# Patient Record
Sex: Female | Born: 1970
Health system: Southern US, Community
[De-identification: ages and names within clinical notes are randomized; demographics above are authoritative.]

## PROBLEM LIST (undated history)

## (undated) DIAGNOSIS — E236 Other disorders of pituitary gland: Secondary | ICD-10-CM

## (undated) DIAGNOSIS — I499 Cardiac arrhythmia, unspecified: Secondary | ICD-10-CM

## (undated) DIAGNOSIS — Z8614 Personal history of Methicillin resistant Staphylococcus aureus infection: Secondary | ICD-10-CM

## (undated) DIAGNOSIS — E785 Hyperlipidemia, unspecified: Secondary | ICD-10-CM

## (undated) DIAGNOSIS — E119 Type 2 diabetes mellitus without complications: Secondary | ICD-10-CM

## (undated) DIAGNOSIS — K5792 Diverticulitis of intestine, part unspecified, without perforation or abscess without bleeding: Secondary | ICD-10-CM

## (undated) DIAGNOSIS — K219 Gastro-esophageal reflux disease without esophagitis: Secondary | ICD-10-CM

## (undated) DIAGNOSIS — E559 Vitamin D deficiency, unspecified: Secondary | ICD-10-CM

## (undated) DIAGNOSIS — G629 Polyneuropathy, unspecified: Secondary | ICD-10-CM

## (undated) DIAGNOSIS — J189 Pneumonia, unspecified organism: Secondary | ICD-10-CM

## (undated) DIAGNOSIS — R011 Cardiac murmur, unspecified: Secondary | ICD-10-CM

## (undated) DIAGNOSIS — F32A Depression, unspecified: Secondary | ICD-10-CM

## (undated) DIAGNOSIS — M7662 Achilles tendinitis, left leg: Secondary | ICD-10-CM

## (undated) DIAGNOSIS — Z87898 Personal history of other specified conditions: Secondary | ICD-10-CM

## (undated) DIAGNOSIS — K5900 Constipation, unspecified: Secondary | ICD-10-CM

## (undated) DIAGNOSIS — F419 Anxiety disorder, unspecified: Secondary | ICD-10-CM

## (undated) DIAGNOSIS — M1990 Unspecified osteoarthritis, unspecified site: Secondary | ICD-10-CM

## (undated) DIAGNOSIS — E039 Hypothyroidism, unspecified: Secondary | ICD-10-CM

## (undated) DIAGNOSIS — F329 Major depressive disorder, single episode, unspecified: Secondary | ICD-10-CM

## (undated) DIAGNOSIS — D72820 Lymphocytosis (symptomatic): Secondary | ICD-10-CM

## (undated) DIAGNOSIS — G609 Hereditary and idiopathic neuropathy, unspecified: Secondary | ICD-10-CM

## (undated) DIAGNOSIS — R079 Chest pain, unspecified: Secondary | ICD-10-CM

## (undated) DIAGNOSIS — J45909 Unspecified asthma, uncomplicated: Secondary | ICD-10-CM

## (undated) DIAGNOSIS — I10 Essential (primary) hypertension: Secondary | ICD-10-CM

## (undated) DIAGNOSIS — D680 Von Willebrand's disease: Secondary | ICD-10-CM

## (undated) DIAGNOSIS — C801 Malignant (primary) neoplasm, unspecified: Secondary | ICD-10-CM

## (undated) DIAGNOSIS — E739 Lactose intolerance, unspecified: Secondary | ICD-10-CM

## (undated) DIAGNOSIS — D352 Benign neoplasm of pituitary gland: Secondary | ICD-10-CM

## (undated) DIAGNOSIS — M205X2 Other deformities of toe(s) (acquired), left foot: Secondary | ICD-10-CM

## (undated) DIAGNOSIS — M549 Dorsalgia, unspecified: Secondary | ICD-10-CM

## (undated) DIAGNOSIS — R7303 Prediabetes: Secondary | ICD-10-CM

## (undated) DIAGNOSIS — M5030 Other cervical disc degeneration, unspecified cervical region: Secondary | ICD-10-CM

## (undated) DIAGNOSIS — R002 Palpitations: Secondary | ICD-10-CM

## (undated) DIAGNOSIS — J449 Chronic obstructive pulmonary disease, unspecified: Secondary | ICD-10-CM

## (undated) DIAGNOSIS — Z8489 Family history of other specified conditions: Secondary | ICD-10-CM

## (undated) DIAGNOSIS — G4730 Sleep apnea, unspecified: Secondary | ICD-10-CM

## (undated) DIAGNOSIS — R51 Headache: Secondary | ICD-10-CM

## (undated) DIAGNOSIS — R519 Headache, unspecified: Secondary | ICD-10-CM

## (undated) DIAGNOSIS — R000 Tachycardia, unspecified: Secondary | ICD-10-CM

## (undated) DIAGNOSIS — R1310 Dysphagia, unspecified: Secondary | ICD-10-CM

## (undated) DIAGNOSIS — E079 Disorder of thyroid, unspecified: Secondary | ICD-10-CM

## (undated) DIAGNOSIS — E282 Polycystic ovarian syndrome: Secondary | ICD-10-CM

## (undated) DIAGNOSIS — M2550 Pain in unspecified joint: Secondary | ICD-10-CM

## (undated) DIAGNOSIS — T7840XA Allergy, unspecified, initial encounter: Secondary | ICD-10-CM

## (undated) DIAGNOSIS — K449 Diaphragmatic hernia without obstruction or gangrene: Secondary | ICD-10-CM

## (undated) DIAGNOSIS — M810 Age-related osteoporosis without current pathological fracture: Secondary | ICD-10-CM

## (undated) HISTORY — DX: Polycystic ovarian syndrome: E28.2

## (undated) HISTORY — DX: Pain in unspecified joint: M25.50

## (undated) HISTORY — DX: Hyperlipidemia, unspecified: E78.5

## (undated) HISTORY — DX: Essential (primary) hypertension: I10

## (undated) HISTORY — DX: Vitamin D deficiency, unspecified: E55.9

## (undated) HISTORY — DX: Cardiac murmur, unspecified: R01.1

## (undated) HISTORY — DX: Age-related osteoporosis without current pathological fracture: M81.0

## (undated) HISTORY — PX: COLONOSCOPY: SHX174

## (undated) HISTORY — DX: Dysphagia, unspecified: R13.10

## (undated) HISTORY — DX: Type 2 diabetes mellitus without complications: E11.9

## (undated) HISTORY — PX: BACK SURGERY: SHX140

## (undated) HISTORY — PX: TONSILLECTOMY AND ADENOIDECTOMY: SUR1326

## (undated) HISTORY — DX: Gastro-esophageal reflux disease without esophagitis: K21.9

## (undated) HISTORY — DX: Lactose intolerance, unspecified: E73.9

## (undated) HISTORY — PX: CHOLECYSTECTOMY: SHX55

## (undated) HISTORY — DX: Constipation, unspecified: K59.00

## (undated) HISTORY — DX: Diaphragmatic hernia without obstruction or gangrene: K44.9

## (undated) HISTORY — DX: Disorder of thyroid, unspecified: E07.9

## (undated) HISTORY — DX: Hereditary and idiopathic neuropathy, unspecified: G60.9

## (undated) HISTORY — DX: Chest pain, unspecified: R07.9

## (undated) HISTORY — PX: SPINE SURGERY: SHX786

## (undated) HISTORY — DX: Diverticulitis of intestine, part unspecified, without perforation or abscess without bleeding: K57.92

## (undated) HISTORY — DX: Tachycardia, unspecified: R00.0

## (undated) HISTORY — DX: Palpitations: R00.2

## (undated) HISTORY — DX: Allergy, unspecified, initial encounter: T78.40XA

## (undated) HISTORY — DX: Dorsalgia, unspecified: M54.9

---

## 1999-05-01 ENCOUNTER — Other Ambulatory Visit: Admission: RE | Admit: 1999-05-01 | Discharge: 1999-05-01 | Payer: Self-pay | Admitting: Gynecology

## 2001-05-17 ENCOUNTER — Other Ambulatory Visit: Admission: RE | Admit: 2001-05-17 | Discharge: 2001-05-17 | Payer: Self-pay | Admitting: Obstetrics and Gynecology

## 2002-06-15 ENCOUNTER — Ambulatory Visit (HOSPITAL_COMMUNITY): Admission: RE | Admit: 2002-06-15 | Discharge: 2002-06-15 | Payer: Self-pay | Admitting: Family Medicine

## 2002-06-15 ENCOUNTER — Encounter: Payer: Self-pay | Admitting: Family Medicine

## 2002-06-23 ENCOUNTER — Observation Stay (HOSPITAL_COMMUNITY): Admission: RE | Admit: 2002-06-23 | Discharge: 2002-06-24 | Payer: Self-pay | Admitting: Neurosurgery

## 2003-08-26 ENCOUNTER — Emergency Department (HOSPITAL_COMMUNITY): Admission: EM | Admit: 2003-08-26 | Discharge: 2003-08-26 | Payer: Self-pay | Admitting: Emergency Medicine

## 2004-07-19 DIAGNOSIS — Z87898 Personal history of other specified conditions: Secondary | ICD-10-CM

## 2004-07-19 HISTORY — DX: Personal history of other specified conditions: Z87.898

## 2004-12-04 ENCOUNTER — Ambulatory Visit: Payer: Self-pay | Admitting: Otolaryngology

## 2005-03-19 ENCOUNTER — Ambulatory Visit: Payer: Self-pay | Admitting: Otolaryngology

## 2005-06-05 ENCOUNTER — Ambulatory Visit: Payer: Self-pay | Admitting: Internal Medicine

## 2005-07-14 ENCOUNTER — Ambulatory Visit: Payer: Self-pay | Admitting: Otolaryngology

## 2005-07-29 ENCOUNTER — Ambulatory Visit: Payer: Self-pay | Admitting: Internal Medicine

## 2005-08-06 ENCOUNTER — Inpatient Hospital Stay: Payer: Self-pay | Admitting: Internal Medicine

## 2005-08-26 ENCOUNTER — Ambulatory Visit: Payer: Self-pay | Admitting: Pain Medicine

## 2005-09-02 ENCOUNTER — Encounter: Payer: Self-pay | Admitting: Internal Medicine

## 2005-09-04 ENCOUNTER — Ambulatory Visit: Payer: Self-pay | Admitting: Internal Medicine

## 2005-09-18 ENCOUNTER — Ambulatory Visit: Payer: Self-pay | Admitting: Internal Medicine

## 2005-09-18 ENCOUNTER — Encounter: Payer: Self-pay | Admitting: Internal Medicine

## 2005-09-21 ENCOUNTER — Ambulatory Visit: Payer: Self-pay | Admitting: Pain Medicine

## 2005-10-19 ENCOUNTER — Encounter: Payer: Self-pay | Admitting: Internal Medicine

## 2005-11-20 ENCOUNTER — Ambulatory Visit: Payer: Self-pay | Admitting: Internal Medicine

## 2006-02-18 ENCOUNTER — Ambulatory Visit: Payer: Self-pay | Admitting: Internal Medicine

## 2006-03-19 ENCOUNTER — Ambulatory Visit: Payer: Self-pay | Admitting: Internal Medicine

## 2006-03-29 ENCOUNTER — Ambulatory Visit: Payer: Self-pay | Admitting: Internal Medicine

## 2006-05-07 ENCOUNTER — Ambulatory Visit: Payer: Self-pay | Admitting: Internal Medicine

## 2006-05-14 ENCOUNTER — Ambulatory Visit: Payer: Self-pay | Admitting: Internal Medicine

## 2006-05-19 ENCOUNTER — Ambulatory Visit: Payer: Self-pay | Admitting: Internal Medicine

## 2006-06-17 ENCOUNTER — Ambulatory Visit: Payer: Self-pay | Admitting: Internal Medicine

## 2006-10-19 DIAGNOSIS — J189 Pneumonia, unspecified organism: Secondary | ICD-10-CM

## 2006-10-19 HISTORY — DX: Pneumonia, unspecified organism: J18.9

## 2006-10-29 ENCOUNTER — Ambulatory Visit: Payer: Self-pay | Admitting: Internal Medicine

## 2006-11-19 ENCOUNTER — Ambulatory Visit: Payer: Self-pay | Admitting: Internal Medicine

## 2007-02-03 ENCOUNTER — Ambulatory Visit: Payer: Self-pay

## 2007-02-15 ENCOUNTER — Ambulatory Visit: Payer: Self-pay

## 2007-08-17 ENCOUNTER — Ambulatory Visit: Payer: Self-pay

## 2007-09-14 ENCOUNTER — Ambulatory Visit: Payer: Self-pay | Admitting: Otolaryngology

## 2007-12-18 ENCOUNTER — Ambulatory Visit: Payer: Self-pay | Admitting: Internal Medicine

## 2007-12-26 ENCOUNTER — Ambulatory Visit: Payer: Self-pay | Admitting: Internal Medicine

## 2008-02-06 ENCOUNTER — Ambulatory Visit: Payer: Self-pay | Admitting: Unknown Physician Specialty

## 2008-02-06 HISTORY — PX: OTHER SURGICAL HISTORY: SHX169

## 2008-06-05 ENCOUNTER — Ambulatory Visit: Payer: Self-pay | Admitting: Internal Medicine

## 2008-06-27 ENCOUNTER — Ambulatory Visit: Payer: Self-pay | Admitting: Pain Medicine

## 2008-07-04 ENCOUNTER — Ambulatory Visit: Payer: Self-pay | Admitting: Pain Medicine

## 2008-07-05 ENCOUNTER — Ambulatory Visit: Payer: Self-pay | Admitting: Pain Medicine

## 2008-08-08 ENCOUNTER — Ambulatory Visit: Payer: Self-pay | Admitting: Physician Assistant

## 2008-10-19 HISTORY — PX: BREAST BIOPSY: SHX20

## 2009-08-01 ENCOUNTER — Ambulatory Visit: Payer: Self-pay | Admitting: Obstetrics and Gynecology

## 2009-08-13 ENCOUNTER — Ambulatory Visit: Payer: Self-pay | Admitting: Obstetrics and Gynecology

## 2009-08-26 ENCOUNTER — Ambulatory Visit: Payer: Self-pay | Admitting: Surgery

## 2009-11-12 ENCOUNTER — Ambulatory Visit: Payer: Self-pay | Admitting: Family Medicine

## 2010-03-25 ENCOUNTER — Ambulatory Visit: Payer: Self-pay | Admitting: General Surgery

## 2010-07-22 ENCOUNTER — Ambulatory Visit: Payer: Self-pay | Admitting: Family Medicine

## 2010-07-24 ENCOUNTER — Ambulatory Visit: Payer: Self-pay | Admitting: Family Medicine

## 2010-08-14 ENCOUNTER — Ambulatory Visit: Payer: Self-pay | Admitting: Obstetrics and Gynecology

## 2010-10-19 DIAGNOSIS — D6800 Von Willebrand disease, unspecified: Secondary | ICD-10-CM

## 2010-10-19 HISTORY — DX: Von Willebrand disease: D68.0

## 2010-10-19 HISTORY — DX: Von Willebrand disease, unspecified: D68.00

## 2012-06-10 ENCOUNTER — Ambulatory Visit (HOSPITAL_COMMUNITY)
Admission: RE | Admit: 2012-06-10 | Discharge: 2012-06-10 | Disposition: A | Discharge status: Home / Self Care | Payer: BC Managed Care – PPO | Source: Ambulatory Visit | Attending: Family Medicine | Admitting: Family Medicine

## 2012-06-10 ENCOUNTER — Other Ambulatory Visit (HOSPITAL_COMMUNITY): Payer: Self-pay | Admitting: Family Medicine

## 2012-06-10 DIAGNOSIS — R05 Cough: Secondary | ICD-10-CM

## 2012-06-10 DIAGNOSIS — R0989 Other specified symptoms and signs involving the circulatory and respiratory systems: Secondary | ICD-10-CM | POA: Insufficient documentation

## 2012-06-10 DIAGNOSIS — R0602 Shortness of breath: Secondary | ICD-10-CM | POA: Insufficient documentation

## 2012-06-10 DIAGNOSIS — R059 Cough, unspecified: Secondary | ICD-10-CM

## 2013-03-03 ENCOUNTER — Ambulatory Visit: Payer: Self-pay | Admitting: Unknown Physician Specialty

## 2013-03-06 LAB — PATHOLOGY REPORT

## 2013-05-02 ENCOUNTER — Ambulatory Visit (HOSPITAL_COMMUNITY): Payer: BC Managed Care – PPO | Admitting: Oncology

## 2013-05-09 ENCOUNTER — Ambulatory Visit (HOSPITAL_COMMUNITY): Payer: BC Managed Care – PPO

## 2013-05-18 ENCOUNTER — Ambulatory Visit: Payer: Self-pay | Admitting: Internal Medicine

## 2013-05-18 LAB — CBC CANCER CENTER
Bands: 3 %
Comment - H1-Com3: NORMAL
Eosinophil: 3 %
HCT: 43.3 % (ref 35.0–47.0)
HGB: 14.7 g/dL (ref 12.0–16.0)
Lymphocytes: 40 %
MCH: 29.7 pg (ref 26.0–34.0)
MCHC: 33.8 g/dL (ref 32.0–36.0)
MCV: 88 fL (ref 80–100)
Monocytes: 7 %
Platelet: 321 x10 3/mm (ref 150–440)
RBC: 4.93 10*6/uL (ref 3.80–5.20)
RDW: 14.1 % (ref 11.5–14.5)
Segmented Neutrophils: 42 %
Variant Lymphocyte: 5 %
WBC: 11.9 x10 3/mm — ABNORMAL HIGH (ref 3.6–11.0)

## 2013-05-18 LAB — LACTATE DEHYDROGENASE: LDH: 152 U/L (ref 81–246)

## 2013-05-18 LAB — SEDIMENTATION RATE: Erythrocyte Sed Rate: 4 mm/hr (ref 0–20)

## 2013-05-19 ENCOUNTER — Ambulatory Visit: Payer: Self-pay | Admitting: Internal Medicine

## 2013-05-19 ENCOUNTER — Ambulatory Visit: Payer: Self-pay | Admitting: Unknown Physician Specialty

## 2013-05-23 LAB — PROT IMMUNOELECTROPHORES(ARMC)

## 2013-06-19 ENCOUNTER — Ambulatory Visit: Payer: Self-pay | Admitting: Internal Medicine

## 2013-07-14 ENCOUNTER — Ambulatory Visit: Payer: Self-pay | Admitting: Obstetrics and Gynecology

## 2014-03-15 DIAGNOSIS — E669 Obesity, unspecified: Secondary | ICD-10-CM | POA: Insufficient documentation

## 2014-03-15 DIAGNOSIS — E282 Polycystic ovarian syndrome: Secondary | ICD-10-CM | POA: Insufficient documentation

## 2014-03-15 DIAGNOSIS — I10 Essential (primary) hypertension: Secondary | ICD-10-CM | POA: Insufficient documentation

## 2014-03-15 DIAGNOSIS — Z6841 Body Mass Index (BMI) 40.0 and over, adult: Secondary | ICD-10-CM | POA: Insufficient documentation

## 2014-05-25 DIAGNOSIS — E559 Vitamin D deficiency, unspecified: Secondary | ICD-10-CM | POA: Insufficient documentation

## 2014-10-19 DIAGNOSIS — G629 Polyneuropathy, unspecified: Secondary | ICD-10-CM

## 2014-10-19 HISTORY — PX: HEMORRHOID SURGERY: SHX153

## 2014-10-19 HISTORY — DX: Polyneuropathy, unspecified: G62.9

## 2014-11-14 ENCOUNTER — Ambulatory Visit: Payer: Self-pay | Admitting: Obstetrics and Gynecology

## 2014-12-06 DIAGNOSIS — R739 Hyperglycemia, unspecified: Secondary | ICD-10-CM | POA: Insufficient documentation

## 2015-01-14 ENCOUNTER — Ambulatory Visit
Admit: 2015-01-14 | Disposition: A | Discharge status: Home / Self Care | Payer: Self-pay | Attending: Internal Medicine | Admitting: Internal Medicine

## 2015-01-18 ENCOUNTER — Ambulatory Visit
Admit: 2015-01-18 | Disposition: A | Discharge status: Home / Self Care | Payer: Self-pay | Attending: Internal Medicine | Admitting: Internal Medicine

## 2015-01-21 ENCOUNTER — Other Ambulatory Visit: Payer: Self-pay

## 2015-01-22 ENCOUNTER — Other Ambulatory Visit: Payer: Self-pay

## 2015-02-07 ENCOUNTER — Other Ambulatory Visit: Payer: Self-pay

## 2015-04-10 ENCOUNTER — Other Ambulatory Visit: Payer: Self-pay

## 2015-04-10 VITALS — BP 108/78 | HR 90 | Ht 62.0 in | Wt 229.5 lb

## 2015-04-10 DIAGNOSIS — E119 Type 2 diabetes mellitus without complications: Secondary | ICD-10-CM

## 2015-04-10 NOTE — Patient Instructions (Signed)
1. Plan to eat 30-45 GM (2-3) servings of carbohydrate a meal and 15 GM for snacks.  Plan to eat protein with snacks 2. Plan to drink Diet Coke instead of regular Coke every other day 3. Plan to pick up glucometer  and blood pressure monitor at pharmacy  4. Plan to check blood sugar once a week either fasting or 1 -2hrs after a meal.  Goals of 80-130 fasting and 180 after meals 5. Plan to complete EMMI programs by 05/11/15 6. Plan to walk 35-40 minutes 3 times a week. 7. Plan to return to Link to Wellness on 07/31/15 at 12 PM

## 2015-04-10 NOTE — Patient Outreach (Signed)
Heather Burnett) Care Management   04/10/2015  Heather Burnett November 04, 1970 [id number]  Heather Burnett is an 44 y.o. female.   Member seen for initial office visit for Link to Wellness program for self management of Type 2 diabetes  Subjective: Member states she would like to get healthier and feel better.  States she has had problems with her allergies and sinuses for the last week or so.  States she tries to watch her diet but she is still drinking a regular Coke every day.  Objective:   Review of Systems  Neurological: Positive for tingling.  All other systems reviewed and are negative.   Physical Exam  Filed Vitals:   04/10/15 1217  BP: 108/78  Pulse: 90   Filed Weights   04/10/15 1217  Weight: 229 lb 8 oz (104.101 kg)    Current Medications:   Current Outpatient Prescriptions  Medication Sig Dispense Refill  . albuterol (ACCUNEB) 1.25 MG/3ML nebulizer solution Inhale 1 ampule into the lungs every 6 (six) hours as needed. Take 1.25mg  by nebulizer every 6 hours as needed for wheezing    . atorvastatin (LIPITOR) 10 MG tablet Take 1 tablet by mouth daily.    . cetirizine (ZYRTEC) 10 MG tablet Take 1 tablet by mouth daily.    . Cholecalciferol (VITAMIN D3) 10000 UNITS capsule Take 10,000 Units by mouth daily.    . clindamycin (CLEOCIN T) 1 % SWAB Apply 1 applicator topically 2 (two) times daily as needed.    . cyclobenzaprine (FLEXERIL) 10 MG tablet Take 10 mg by mouth 3 (three) times daily as needed. Take one-half to one tablet three times a day as needed    . DHA-EPA-VITAMIN E PO Take 1 capsule by mouth daily.    . fluticasone (FLONASE) 50 MCG/ACT nasal spray Place 1 spray into the nose 2 (two) times daily as needed.    . Fluticasone-Salmeterol (ADVAIR DISKUS) 250-50 MCG/DOSE AEPB Inhale 1 puff into the lungs every 12 (twelve) hours as needed.    Marland Kitchen levothyroxine (SYNTHROID, LEVOTHROID) 25 MCG tablet Take 1 tablet by mouth daily.    . metFORMIN (GLUCOPHAGE)  1000 MG tablet Take 0.5 tablets by mouth 2 (two) times daily with a meal.    . metoprolol succinate (TOPROL-XL) 50 MG 24 hr tablet Take 1 tablet by mouth daily.    . norethindrone (MICRONOR,CAMILA,ERRIN) 0.35 MG tablet Take 1 tablet by mouth daily.    . phentermine 30 MG capsule Take 1 capsule by mouth daily. Take 1 capsule daily for 2 weeks then off for 2 weeks    . ranitidine (V-R ACID REDUCER) 75 MG tablet Take 1 tablet by mouth daily as needed.    Marland Kitchen spironolactone (ALDACTONE) 25 MG tablet Take 1 tablet by mouth 2 (two) times daily.    Marland Kitchen tretinoin (RETIN-A) 0.025 % cream Apply 1 application topically at bedtime.     No current facility-administered medications for this visit.    Functional Status:   In your present state of health, do you have any difficulty performing the following activities: 04/10/2015  Hearing? N  Vision? N  Difficulty concentrating or making decisions? N  Walking or climbing stairs? N  Dressing or bathing? N  Doing errands, shopping? N  Preparing Food and eating ? N  Using the Toilet? N  In the past six months, have you accidently leaked urine? N  Do you have problems with loss of bowel control? N  Managing your Medications? N  Managing your  Finances? N  Housekeeping or managing your Housekeeping? N    Fall/Depression Screening:    PHQ 2/9 Scores 04/10/2015  PHQ - 2 Score 0   THN CM Care Plan Problem One        Patient Outreach from 04/10/2015 in [place] Problem One  Potential for elevated blood sugars related to dx of Type 2 DM   Care Plan for Problem One  Active   THN Long Term Goal (31-90 days)  Member will maintain hemoglobin A1C at or below 6.5 for the next 90 days   THN Long Term Goal Start Date  04/10/15   Interventions for Problem One Long Term Goal  Given Link to Wellness diabetes education packet, Reviewed program requirements, Instructed on CHO counting and portion control, Instructed on importance of regular exercise  and glycemic control, Encouraged to decrease amount of regular soda she drinks,  Instructed to pick up glucometer and blood pressure monitor at pharmacy, Reveiwed blood sugar goals   THN CM Short Term Goal #1 (0-30 days)  Member will complete EMMI programs by 05/11/15   [facility] CM Short Term Goal #1 Start Date  04/10/15   Interventions for Short Term Goal #1  Instructed on how to access EMMI programs      Assessment:  Member seen for initial office visit for Link to Wellness program for self management of Type 2 diabetes.  Member has been well controlled with last hemoglobin A1C of 5.8.  She does not check her CBGs as she does not have a meter.  Member reports she is walking 3 times a week for 30 minutes.  She has eye exam scheduled in July.  Plan:   Plan to eat 30-45 GM (2-3) servings of carbohydrate a meal and 15 GM for snacks.  Plan to eat protein with snacks Plan to drink Diet Coke instead of regular Coke every other day Plan to pick up glucometer  and blood pressure monitor at pharmacy  Plan to check blood sugar once a week either fasting or 1 -2hrs after a meal.  Goals of 80-130 fasting and 180 after meals Plan to complete EMMI programs by 05/11/15 Plan to walk 35-40 minutes 3 times a week. Plan to return to Link to Wellness on 07/31/15 at 12 PM  [place], [facility] Care Management Coordinator-Link to [place] Management [phone]

## 2015-04-30 ENCOUNTER — Other Ambulatory Visit
Admission: RE | Admit: 2015-04-30 | Discharge: 2015-04-30 | Disposition: A | Discharge status: Home / Self Care | Payer: 59 | Source: Ambulatory Visit | Attending: Family Medicine | Admitting: Family Medicine

## 2015-04-30 DIAGNOSIS — Z8639 Personal history of other endocrine, nutritional and metabolic disease: Secondary | ICD-10-CM | POA: Diagnosis not present

## 2015-04-30 DIAGNOSIS — J069 Acute upper respiratory infection, unspecified: Secondary | ICD-10-CM | POA: Insufficient documentation

## 2015-04-30 DIAGNOSIS — E039 Hypothyroidism, unspecified: Secondary | ICD-10-CM | POA: Insufficient documentation

## 2015-04-30 DIAGNOSIS — I10 Essential (primary) hypertension: Secondary | ICD-10-CM | POA: Diagnosis not present

## 2015-04-30 LAB — CBC WITH DIFFERENTIAL/PLATELET
Basophils Absolute: 0.1 10*3/uL (ref 0–0.1)
Basophils Relative: 1 %
Eosinophils Absolute: 0.3 10*3/uL (ref 0–0.7)
Eosinophils Relative: 3 %
HCT: 42.4 % (ref 35.0–47.0)
Hemoglobin: 13.9 g/dL (ref 12.0–16.0)
Lymphocytes Relative: 44 %
Lymphs Abs: 5.2 10*3/uL — ABNORMAL HIGH (ref 1.0–3.6)
MCH: 29.1 pg (ref 26.0–34.0)
MCHC: 32.7 g/dL (ref 32.0–36.0)
MCV: 88.9 fL (ref 80.0–100.0)
Monocytes Absolute: 0.7 10*3/uL (ref 0.2–0.9)
Monocytes Relative: 6 %
Neutro Abs: 5.4 10*3/uL (ref 1.4–6.5)
Neutrophils Relative %: 46 %
Platelets: 372 10*3/uL (ref 150–440)
RBC: 4.78 MIL/uL (ref 3.80–5.20)
RDW: 13.9 % (ref 11.5–14.5)
WBC: 11.7 10*3/uL — ABNORMAL HIGH (ref 3.6–11.0)

## 2015-04-30 LAB — COMPREHENSIVE METABOLIC PANEL
ALT: 21 U/L (ref 14–54)
AST: 21 U/L (ref 15–41)
Albumin: 4.1 g/dL (ref 3.5–5.0)
Alkaline Phosphatase: 50 U/L (ref 38–126)
Anion gap: 7 (ref 5–15)
BUN: 13 mg/dL (ref 6–20)
CO2: 27 mmol/L (ref 22–32)
Calcium: 9.1 mg/dL (ref 8.9–10.3)
Chloride: 106 mmol/L (ref 101–111)
Creatinine, Ser: 0.71 mg/dL (ref 0.44–1.00)
GFR calc Af Amer: >60 mL/min (ref >60)
GFR calc non Af Amer: >60 mL/min (ref >60)
Glucose, Bld: 94 mg/dL (ref 65–99)
Potassium: 4.3 mmol/L (ref 3.5–5.1)
Sodium: 140 mmol/L (ref 135–145)
Total Bilirubin: 0.7 mg/dL (ref 0.3–1.2)
Total Protein: 7.1 g/dL (ref 6.5–8.1)

## 2015-04-30 LAB — LIPID PANEL
Cholesterol: 168 mg/dL (ref 0–200)
HDL: 52 mg/dL (ref >40)
LDL Cholesterol: 89 mg/dL (ref 0–99)
Total CHOL/HDL Ratio: 3.2 RATIO
Triglycerides: 133 mg/dL (ref <150)
VLDL: 27 mg/dL (ref 0–40)

## 2015-04-30 LAB — TSH: TSH: 1.191 u[IU]/mL (ref 0.350–4.500)

## 2015-05-02 LAB — T4: T4, Total: 6.8 ug/dL (ref 4.5–12.0)

## 2015-05-02 LAB — T3: T3, Total: 120 ng/dL (ref 71–180)

## 2015-07-11 ENCOUNTER — Other Ambulatory Visit
Admission: RE | Admit: 2015-07-11 | Discharge: 2015-07-11 | Disposition: A | Discharge status: Home / Self Care | Payer: 59 | Source: Ambulatory Visit | Attending: Internal Medicine | Admitting: Internal Medicine

## 2015-07-11 DIAGNOSIS — E785 Hyperlipidemia, unspecified: Secondary | ICD-10-CM | POA: Insufficient documentation

## 2015-07-11 DIAGNOSIS — R202 Paresthesia of skin: Secondary | ICD-10-CM | POA: Insufficient documentation

## 2015-07-11 DIAGNOSIS — E559 Vitamin D deficiency, unspecified: Secondary | ICD-10-CM | POA: Diagnosis present

## 2015-07-11 DIAGNOSIS — I10 Essential (primary) hypertension: Secondary | ICD-10-CM | POA: Insufficient documentation

## 2015-07-11 LAB — COMPREHENSIVE METABOLIC PANEL
ALT: 18 U/L (ref 14–54)
AST: 20 U/L (ref 15–41)
Albumin: 4 g/dL (ref 3.5–5.0)
Alkaline Phosphatase: 47 U/L (ref 38–126)
Anion gap: 8 (ref 5–15)
BUN: 16 mg/dL (ref 6–20)
CO2: 28 mmol/L (ref 22–32)
Calcium: 9 mg/dL (ref 8.9–10.3)
Chloride: 102 mmol/L (ref 101–111)
Creatinine, Ser: 0.7 mg/dL (ref 0.44–1.00)
GFR calc Af Amer: >60 mL/min (ref >60)
GFR calc non Af Amer: >60 mL/min (ref >60)
Glucose, Bld: 104 mg/dL — ABNORMAL HIGH (ref 65–99)
Potassium: 4.1 mmol/L (ref 3.5–5.1)
Sodium: 138 mmol/L (ref 135–145)
Total Bilirubin: 0.8 mg/dL (ref 0.3–1.2)
Total Protein: 7.3 g/dL (ref 6.5–8.1)

## 2015-07-11 LAB — CBC WITH DIFFERENTIAL/PLATELET
Basophils Absolute: 0.1 10*3/uL (ref 0–0.1)
Basophils Relative: 1 %
Eosinophils Absolute: 0.3 10*3/uL (ref 0–0.7)
Eosinophils Relative: 3 %
HCT: 42.2 % (ref 35.0–47.0)
Hemoglobin: 14.1 g/dL (ref 12.0–16.0)
Lymphocytes Relative: 38 %
Lymphs Abs: 4.7 10*3/uL — ABNORMAL HIGH (ref 1.0–3.6)
MCH: 29.8 pg (ref 26.0–34.0)
MCHC: 33.4 g/dL (ref 32.0–36.0)
MCV: 89.4 fL (ref 80.0–100.0)
Monocytes Absolute: 0.9 10*3/uL (ref 0.2–0.9)
Monocytes Relative: 8 %
Neutro Abs: 6.3 10*3/uL (ref 1.4–6.5)
Neutrophils Relative %: 50 %
Platelets: 347 10*3/uL (ref 150–440)
RBC: 4.72 MIL/uL (ref 3.80–5.20)
RDW: 14.2 % (ref 11.5–14.5)
WBC: 12.4 10*3/uL — ABNORMAL HIGH (ref 3.6–11.0)

## 2015-07-11 LAB — URINALYSIS COMPLETE WITH MICROSCOPIC (ARMC ONLY)
Bilirubin Urine: NEGATIVE
Glucose, UA: NEGATIVE mg/dL
Ketones, ur: NEGATIVE mg/dL
Nitrite: NEGATIVE
Protein, ur: NEGATIVE mg/dL
Specific Gravity, Urine: 1.021 (ref 1.005–1.030)
pH: 6 (ref 5.0–8.0)

## 2015-07-11 LAB — LIPID PANEL
Cholesterol: 143 mg/dL (ref 0–200)
HDL: 41 mg/dL (ref >40)
LDL Cholesterol: 69 mg/dL (ref 0–99)
Total CHOL/HDL Ratio: 3.5 RATIO
Triglycerides: 167 mg/dL — ABNORMAL HIGH (ref <150)
VLDL: 33 mg/dL (ref 0–40)

## 2015-07-11 LAB — RAPID HIV SCREEN (HIV 1/2 AB+AG)
HIV 1/2 Antibodies: NONREACTIVE
HIV-1 P24 Antigen - HIV24: NONREACTIVE

## 2015-07-11 LAB — TSH: TSH: 0.944 u[IU]/mL (ref 0.350–4.500)

## 2015-07-11 LAB — HEMOGLOBIN A1C: Hgb A1c MFr Bld: 5.7 % (ref 4.0–6.0)

## 2015-07-11 LAB — VITAMIN B12: Vitamin B-12: 449 pg/mL (ref 180–914)

## 2015-07-12 LAB — MICROALBUMIN / CREATININE URINE RATIO
Creatinine, Urine: 178.2 mg/dL
Microalb Creat Ratio: 11.4 mg/g creat (ref 0.0–30.0)
Microalb, Ur: 20.3 ug/mL — ABNORMAL HIGH

## 2015-07-12 LAB — VITAMIN D 25 HYDROXY (VIT D DEFICIENCY, FRACTURES): Vit D, 25-Hydroxy: 45.8 ng/mL (ref 30.0–100.0)

## 2015-07-31 ENCOUNTER — Other Ambulatory Visit: Payer: Self-pay

## 2015-07-31 VITALS — BP 112/82 | HR 98 | Resp 16 | Ht 62.0 in | Wt 233.3 lb

## 2015-07-31 DIAGNOSIS — E785 Hyperlipidemia, unspecified: Secondary | ICD-10-CM | POA: Insufficient documentation

## 2015-07-31 DIAGNOSIS — E119 Type 2 diabetes mellitus without complications: Secondary | ICD-10-CM

## 2015-07-31 DIAGNOSIS — M1990 Unspecified osteoarthritis, unspecified site: Secondary | ICD-10-CM | POA: Insufficient documentation

## 2015-07-31 NOTE — Patient Outreach (Signed)
[facility]) Care Management   07/31/2015  Therese Rocco April 22, 1971 [id number]  Heather Burnett is an 44 y.o. female.   Member seen for follow up office visit for Link to Wellness program for self management of Type 2 diabetes  Subjective: Member states that she has been having back pain for the last 2 months.  States she has been seeing the doctor and an orthopedic MD.  States she has been on 2 rounds of prednisone and she received an injection in her back.  States that she is still having pain and she is afraid she will need surgery.  States she has been checking her blood sugars daily and they are ranging 86-126 before meals.  States her hemoglobin A1C was 5.7 when she saw the MD in September  Objective:   Review of Systems  Musculoskeletal: Positive for back pain.  Member did not bring glucometer for review  Physical Exam Today's Vitals   07/31/15 1210 07/31/15 1213  BP: 112/82   Pulse: 98   Resp: 16   Height: 1.575 m (_0 )   Weight: 233 lb 4.8 oz (105.824 kg)   SpO2: 98%   PainSc: 6  6    Current Medications:   Current Outpatient Prescriptions  Medication Sig Dispense Refill  . albuterol (ACCUNEB) 1.25 MG/3ML nebulizer solution Inhale 1 ampule into the lungs every 6 (six) hours as needed. Take 1.110m by nebulizer every 6 hours as needed for wheezing    . aspirin 81 MG tablet Take 81 mg by mouth daily.    .Marland Kitchenatorvastatin (LIPITOR) 10 MG tablet Take 1 tablet by mouth daily.    . cetirizine (ZYRTEC) 10 MG tablet Take 1 tablet by mouth daily.    . Cholecalciferol (VITAMIN D3) 10000 UNITS capsule Take 10,000 Units by mouth daily.    . clindamycin (CLEOCIN T) 1 % SWAB Apply 1 applicator topically 2 (two) times daily as needed.    . cyclobenzaprine (FLEXERIL) 10 MG tablet Take 10 mg by mouth 3 (three) times daily as needed. Take one-half to one tablet three times a day as needed    . DHA-EPA-VITAMIN E PO Take 1 capsule by mouth daily.    . fluticasone  (FLONASE) 50 MCG/ACT nasal spray Place 1 spray into the nose 2 (two) times daily as needed.    . Fluticasone-Salmeterol (ADVAIR DISKUS) 250-50 MCG/DOSE AEPB Inhale 1 puff into the lungs every 12 (twelve) hours as needed.    .Marland Kitchenlevothyroxine (SYNTHROID, LEVOTHROID) 25 MCG tablet Take 1 tablet by mouth daily.    . metFORMIN (GLUCOPHAGE) 1000 MG tablet Take 0.5 tablets by mouth 2 (two) times daily with a meal.    . metoprolol succinate (TOPROL-XL) 50 MG 24 hr tablet Take 1 tablet by mouth daily.    . norethindrone (MICRONOR,CAMILA,ERRIN) 0.35 MG tablet Take 1 tablet by mouth daily.    . ranitidine (V-R ACID REDUCER) 75 MG tablet Take 1 tablet by mouth daily as needed.    .Marland Kitchenspironolactone (ALDACTONE) 25 MG tablet Take 1 tablet by mouth 2 (two) times daily.    .Marland Kitchentretinoin (RETIN-A) 0.025 % cream Apply 1 application topically at bedtime.    . phentermine 30 MG capsule Take 1 capsule by mouth daily. Take 1 capsule daily for 2 weeks then off for 2 weeks     No current facility-administered medications for this visit.    Functional Status:   In your present state of health, do you have any difficulty performing  the following activities: 07/31/2015 04/10/2015  Hearing? N N  Vision? N N  Difficulty concentrating or making decisions? N N  Walking or climbing stairs? N N  Dressing or bathing? N N  Doing errands, shopping? N N  Preparing Food and eating ? - N  Using the Toilet? - N  In the past six months, have you accidently leaked urine? - N  Do you have problems with loss of bowel control? - N  Managing your Medications? - N  Managing your Finances? - N  Housekeeping or managing your Housekeeping? - N    Fall/Depression Screening:    PHQ 2/9 Scores 07/31/2015 04/10/2015  PHQ - 2 Score 0 0    Assessment:   Member seen for follow up office visit for Link to Wellness program for self management of Type 2 diabetes.  Member is meeting diabetes self -management goals with last hemoglobin A1C 5.7 on  07/11/15.  She reports checking her CBGs daily with ranges of 86-126.  She has not been able to exercise due to back pain.  She reports she is no longer drinking regular sodas. Plan:  Plan to eat 30-45 GM (2-3) servings of carbohydrate a meal and 15 GM for snacks.  Plan to eat protein with snacks Plan to check blood sugar 2-3 times a week either fasting or 1 -2hrs after a meal.  Goals of 80-130 fasting and 180 after meals Plan to complete EMMI programs by 09/18/15    Plan to return to Link to Wellness on 10/30/15 at 12 PM  [facility] CM Care Plan Problem One        Most Recent Value   Care Plan Problem One  Potential for elevated blood sugars related to dx of Type 2 DM   Role Documenting the Problem One  Care Management Coordinator   Care Plan for Problem One  Active   THN Long Term Goal (31-90 days)  Member will maintain hemoglobin A1C at or below 6.5 for the next 90 days   THN Long Term Goal Start Date  07/31/15 [Maintaining hemoglobin A1c with last reading 5.7 on 07/11/15]   [facility] CM Short Term Goal #1 (0-30 days)  Member will complete EMMI programs by 05/11/15   [facility] CM Short Term Goal #1 Start Date  04/10/15   [facility] CM Short Term Goal #1 Met Date  05/10/15 [Completed EMMI programs]    Peter Garter RN, [facility] Care Management Coordinator-Link to [place] Management [phone]

## 2015-07-31 NOTE — Patient Instructions (Signed)
Plan to eat 30-45 GM (2-3) servings of carbohydrate a meal and 15 GM for snacks.  Plan to eat protein with snacks Plan to check blood sugar 2-3 times a week either fasting or 1 -2hrs after a meal.  Goals of 80-130 fasting and 180 after meals Plan to complete EMMI programs by 09/18/15    Plan to return to Link to Wellness on 10/30/15 at 12 PM

## 2015-09-05 ENCOUNTER — Other Ambulatory Visit
Admission: RE | Admit: 2015-09-05 | Discharge: 2015-09-05 | Disposition: A | Discharge status: Home / Self Care | Payer: 59 | Source: Ambulatory Visit | Attending: Neurology | Admitting: Neurology

## 2015-09-05 DIAGNOSIS — R202 Paresthesia of skin: Secondary | ICD-10-CM | POA: Diagnosis not present

## 2015-09-06 LAB — MULTIPLE MYELOMA PANEL, SERUM
Albumin SerPl Elph-Mcnc: 3.6 g/dL (ref 2.9–4.4)
Albumin/Glob SerPl: 1.3 (ref 0.7–1.7)
Alpha 1: 0.2 g/dL (ref 0.0–0.4)
Alpha2 Glob SerPl Elph-Mcnc: 0.8 g/dL (ref 0.4–1.0)
B-Globulin SerPl Elph-Mcnc: 1.1 g/dL (ref 0.7–1.3)
Gamma Glob SerPl Elph-Mcnc: 0.8 g/dL (ref 0.4–1.8)
Globulin, Total: 2.9 g/dL (ref 2.2–3.9)
IgA: 183 mg/dL (ref 87–352)
IgG (Immunoglobin G), Serum: 770 mg/dL (ref 700–1600)
IgM, Serum: 133 mg/dL (ref 26–217)
Total Protein ELP: 6.5 g/dL (ref 6.0–8.5)

## 2015-09-09 ENCOUNTER — Other Ambulatory Visit: Payer: Self-pay | Admitting: Neurology

## 2015-09-09 DIAGNOSIS — R202 Paresthesia of skin: Secondary | ICD-10-CM

## 2015-09-09 LAB — HEAVY METALS PROFILE, URINE
Arsenic (Total),U: 12 ug/L (ref 0–50)
Arsenic(Inorganic),U: NOT DETECTED ug/L (ref 0–19)
Arsenic, 24H Ur: 1 ug/24 hr (ref 0–50)
Creatinine(Crt),U: 1.82 g/L (ref 0.30–3.00)
Lead, 24 hr urine: 0 ug/24 hr (ref 0–80)
Lead, Rand Ur: 1 ug/L (ref 0–49)
Lead/Creat.  Ratio: 1 ug/g creat (ref 0–49)
Mercury, Ur: NOT DETECTED ug/L (ref 0–19)

## 2015-09-26 ENCOUNTER — Other Ambulatory Visit
Admission: RE | Admit: 2015-09-26 | Discharge: 2015-09-26 | Disposition: A | Discharge status: Home / Self Care | Payer: 59 | Source: Ambulatory Visit | Attending: Internal Medicine | Admitting: Internal Medicine

## 2015-09-26 DIAGNOSIS — R000 Tachycardia, unspecified: Secondary | ICD-10-CM | POA: Insufficient documentation

## 2015-09-26 DIAGNOSIS — R0609 Other forms of dyspnea: Secondary | ICD-10-CM | POA: Insufficient documentation

## 2015-09-26 LAB — FIBRIN DERIVATIVES D-DIMER (ARMC ONLY): Fibrin derivatives D-dimer (ARMC): 215 (ref 0–499)

## 2015-09-26 LAB — TROPONIN I: Troponin I: <0.03 ng/mL (ref <0.031)

## 2015-10-01 ENCOUNTER — Ambulatory Visit: Payer: 59

## 2015-10-04 ENCOUNTER — Ambulatory Visit
Admission: RE | Admit: 2015-10-04 | Discharge: 2015-10-04 | Disposition: A | Discharge status: Home / Self Care | Payer: 59 | Source: Ambulatory Visit | Attending: Neurology | Admitting: Neurology

## 2015-10-04 DIAGNOSIS — R202 Paresthesia of skin: Secondary | ICD-10-CM | POA: Insufficient documentation

## 2015-10-04 DIAGNOSIS — M5126 Other intervertebral disc displacement, lumbar region: Secondary | ICD-10-CM | POA: Insufficient documentation

## 2015-10-04 DIAGNOSIS — M5136 Other intervertebral disc degeneration, lumbar region: Secondary | ICD-10-CM | POA: Diagnosis not present

## 2015-10-24 DIAGNOSIS — I10 Essential (primary) hypertension: Secondary | ICD-10-CM | POA: Diagnosis not present

## 2015-10-24 DIAGNOSIS — E785 Hyperlipidemia, unspecified: Secondary | ICD-10-CM | POA: Diagnosis not present

## 2015-10-24 DIAGNOSIS — E282 Polycystic ovarian syndrome: Secondary | ICD-10-CM | POA: Diagnosis not present

## 2015-10-30 ENCOUNTER — Other Ambulatory Visit: Payer: Self-pay

## 2015-10-31 NOTE — Patient Outreach (Signed)
[facility]) Care Management  10/31/2015  Heather Burnett June 14, 1971 [id number]   Member did not show for scheduled 10/30/15 Link to Wellness appointment. Member called and left message to call to reschedule appointment. Missed appointment letter sent. Peter Garter RN, [facility] Care Management Coordinator-Link to [place] Management [phone]

## 2015-11-01 ENCOUNTER — Other Ambulatory Visit: Payer: Self-pay | Admitting: Internal Medicine

## 2015-11-01 DIAGNOSIS — Z1231 Encounter for screening mammogram for malignant neoplasm of breast: Secondary | ICD-10-CM

## 2015-11-19 ENCOUNTER — Ambulatory Visit
Admission: RE | Admit: 2015-11-19 | Discharge: 2015-11-19 | Disposition: A | Discharge status: Home / Self Care | Payer: 59 | Source: Ambulatory Visit | Attending: Internal Medicine | Admitting: Internal Medicine

## 2015-11-19 ENCOUNTER — Other Ambulatory Visit: Payer: Self-pay | Admitting: Internal Medicine

## 2015-11-19 DIAGNOSIS — Z1231 Encounter for screening mammogram for malignant neoplasm of breast: Secondary | ICD-10-CM | POA: Insufficient documentation

## 2015-12-05 DIAGNOSIS — Z85820 Personal history of malignant melanoma of skin: Secondary | ICD-10-CM | POA: Diagnosis not present

## 2015-12-05 DIAGNOSIS — Z1283 Encounter for screening for malignant neoplasm of skin: Secondary | ICD-10-CM | POA: Diagnosis not present

## 2015-12-05 DIAGNOSIS — Z08 Encounter for follow-up examination after completed treatment for malignant neoplasm: Secondary | ICD-10-CM | POA: Diagnosis not present

## 2015-12-05 DIAGNOSIS — D485 Neoplasm of uncertain behavior of skin: Secondary | ICD-10-CM | POA: Diagnosis not present

## 2015-12-05 DIAGNOSIS — L821 Other seborrheic keratosis: Secondary | ICD-10-CM | POA: Diagnosis not present

## 2015-12-05 DIAGNOSIS — D225 Melanocytic nevi of trunk: Secondary | ICD-10-CM | POA: Diagnosis not present

## 2015-12-06 DIAGNOSIS — E1142 Type 2 diabetes mellitus with diabetic polyneuropathy: Secondary | ICD-10-CM | POA: Diagnosis not present

## 2015-12-06 DIAGNOSIS — G4733 Obstructive sleep apnea (adult) (pediatric): Secondary | ICD-10-CM | POA: Diagnosis not present

## 2015-12-20 DIAGNOSIS — M722 Plantar fascial fibromatosis: Secondary | ICD-10-CM | POA: Diagnosis not present

## 2015-12-20 DIAGNOSIS — M205X2 Other deformities of toe(s) (acquired), left foot: Secondary | ICD-10-CM | POA: Diagnosis not present

## 2015-12-25 ENCOUNTER — Other Ambulatory Visit: Payer: Self-pay

## 2015-12-25 VITALS — BP 110/80 | HR 71 | Resp 16 | Ht 62.0 in | Wt 222.8 lb

## 2015-12-25 DIAGNOSIS — R739 Hyperglycemia, unspecified: Secondary | ICD-10-CM

## 2015-12-25 NOTE — Patient Instructions (Signed)
1. Plan to eat 30-45 GM (2-3) servings of carbohydrate a meal and 15 GM for snacks.  Plan to eat protein with snacks 2. Plan to check blood sugar daily either fasting or 1 -2hrs after a meal.  Goals of 80-130 fasting and 180 after meals 3. Plan exercise 5 times a week for 45-60 minutes 4. Plan to complete EMMI programs by 02/24/16 5. Plan to keep appointment with MD on 12/27/15          Plan to return to Link to Wellness on 04/29/16 at 12 PM

## 2015-12-25 NOTE — Patient Outreach (Signed)
[facility]) Care Management   12/25/2015  Heather Burnett August 29, 1971 [id number]  Heather Burnett is an 45 y.o. female.   Member seen for follow up office visit for Link to Wellness program for self management of Prediabetes  Subjective: Member states that she has been going to exercise at the [employer] 5 times a week.  States she is tying to eat better and she continues to not drink regular sodas.  States she is to see Dr.Klein on 12/27/15  Objective:   Review of Systems  Musculoskeletal: Positive for joint pain.  All other systems reviewed and are negative.   Physical Exam  Today's Vitals   12/25/15 1208 12/25/15 1211  BP: 110/80   Pulse: 71   Resp: 16   Height: 1.575 m (5\' 2" )   Weight: 222 lb 12.8 oz (101.061 kg)   SpO2: 98%   PainSc: 4  4     Current Medications:   Current Outpatient Prescriptions  Medication Sig Dispense Refill  . albuterol (ACCUNEB) 1.25 MG/3ML nebulizer solution Inhale 1 ampule into the lungs every 6 (six) hours as needed. Take 1.25mg  by nebulizer every 6 hours as needed for wheezing    . aspirin 81 MG tablet Take 81 mg by mouth daily.    . cetirizine (ZYRTEC) 10 MG tablet Take 1 tablet by mouth daily.    . Cholecalciferol (VITAMIN D3) 10000 UNITS capsule Take 10,000 Units by mouth daily.    . clindamycin (CLEOCIN T) 1 % SWAB Apply 1 applicator topically 2 (two) times daily as needed.    . cyclobenzaprine (FLEXERIL) 10 MG tablet Take 10 mg by mouth 3 (three) times daily as needed. Take one-half to one tablet three times a day as needed    . DHA-EPA-VITAMIN E PO Take 1 capsule by mouth daily.    . DULoxetine (CYMBALTA) 60 MG capsule Take 60 mg by mouth daily.    . fluticasone (FLONASE) 50 MCG/ACT nasal spray Place 1 spray into the nose 2 (two) times daily as needed.    . Fluticasone-Salmeterol (ADVAIR DISKUS) 250-50 MCG/DOSE AEPB Inhale 1 puff into the lungs every 12 (twelve) hours as needed.    Marland Kitchen levothyroxine (SYNTHROID,  LEVOTHROID) 25 MCG tablet Take 1 tablet by mouth daily.    . metFORMIN (GLUCOPHAGE) 1000 MG tablet Take 0.5 tablets by mouth 2 (two) times daily with a meal.    . metoprolol succinate (TOPROL-XL) 50 MG 24 hr tablet Take 1 tablet by mouth daily.    . norethindrone (MICRONOR,CAMILA,ERRIN) 0.35 MG tablet Take 1 tablet by mouth daily.    . phentermine 30 MG capsule Take 1 capsule by mouth daily. Take 1 capsule daily for 2 weeks then off for 2 weeks    . ranitidine (V-R ACID REDUCER) 75 MG tablet Take 1 tablet by mouth daily as needed.    Marland Kitchen spironolactone (ALDACTONE) 25 MG tablet Take 1 tablet by mouth 2 (two) times daily.    Marland Kitchen tretinoin (RETIN-A) 0.025 % cream Apply 1 application topically at bedtime.    Marland Kitchen atorvastatin (LIPITOR) 10 MG tablet Take 1 tablet by mouth daily. Taking     No current facility-administered medications for this visit.    Functional Status:   In your present state of health, do you have any difficulty performing the following activities: 12/25/2015 07/31/2015  Hearing? N N  Vision? N N  Difficulty concentrating or making decisions? N N  Walking or climbing stairs? N N  Dressing or bathing?  N N  Doing errands, shopping? N N  Preparing Food and eating ? - -  Using the Toilet? - -  In the past six months, have you accidently leaked urine? - -  Do you have problems with loss of bowel control? - -  Managing your Medications? - -  Managing your Finances? - -  Housekeeping or managing your Housekeeping? - -    Fall/Depression Screening:    PHQ 2/9 Scores 12/25/2015 07/31/2015 04/10/2015  PHQ - 2 Score 1 0 0    Assessment:  Member seen for follow up office visit for Link to Wellness program for self management of Type 2 diabetes. Member is meeting diabetes self -management goals with last hemoglobin A1C 5.7 on 07/11/15. She reports checking her CBGs daily with ranges of 86-126. She has been exercising 5 days a week.  She has lost 10 lb since last visit.  She reports she is  no longer drinking regular sodas  Plan:   Plan to eat 30-45 GM (2-3) servings of carbohydrate a meal and 15 GM for snacks.  Plan to eat protein with snacks Plan to check blood sugar daily either fasting or 1 -2hrs after a meal.  Goals of 80-130 fasting and 180 after meals Plan exercise 5 times a week for 45-60 minutes Plan to complete EMMI programs by 02/24/16 Plan to keep appointment with MD on 12/27/15    Plan to return to Link to Wellness on 04/29/16 at 12 PM  [facility] CM Care Plan Problem One        Most Recent Value   Care Plan Problem One  Potential for elevated blood sugars related to dx of Type 2 DM   Role Documenting the Problem One  Care Management Coordinator   Care Plan for Problem One  Active   THN Long Term Goal (31-90 days)  Member will maintain hemoglobin A1C at or below 6.5 for the next 90 days   THN Long Term Goal Start Date  12/25/15 [Hemoglobin A1C 5.7 to be rechecked 3/101/7]   Interventions for Problem One Long Term Goal   Reinforced on CHO counting and portion control,Praised for losing weight and exercsing regularly Reinforced on importance of regular exercise and glycemic control, Reviewed sugar goals    Peter Garter RN, [facility] Care Management Coordinator-Link to [place] Management [phone]

## 2015-12-27 ENCOUNTER — Ambulatory Visit: Payer: 59 | Attending: Neurology

## 2015-12-27 DIAGNOSIS — G4733 Obstructive sleep apnea (adult) (pediatric): Secondary | ICD-10-CM | POA: Diagnosis not present

## 2015-12-27 DIAGNOSIS — I10 Essential (primary) hypertension: Secondary | ICD-10-CM | POA: Diagnosis not present

## 2015-12-27 DIAGNOSIS — E282 Polycystic ovarian syndrome: Secondary | ICD-10-CM | POA: Diagnosis not present

## 2015-12-27 DIAGNOSIS — Z6841 Body Mass Index (BMI) 40.0 and over, adult: Secondary | ICD-10-CM | POA: Diagnosis not present

## 2015-12-27 DIAGNOSIS — D680 Von Willebrand's disease: Secondary | ICD-10-CM | POA: Diagnosis not present

## 2015-12-31 DIAGNOSIS — G4733 Obstructive sleep apnea (adult) (pediatric): Secondary | ICD-10-CM | POA: Diagnosis not present

## 2016-01-10 DIAGNOSIS — G4733 Obstructive sleep apnea (adult) (pediatric): Secondary | ICD-10-CM | POA: Diagnosis not present

## 2016-01-10 DIAGNOSIS — M205X2 Other deformities of toe(s) (acquired), left foot: Secondary | ICD-10-CM | POA: Diagnosis not present

## 2016-01-14 DIAGNOSIS — G4733 Obstructive sleep apnea (adult) (pediatric): Secondary | ICD-10-CM | POA: Diagnosis not present

## 2016-02-10 DIAGNOSIS — G4733 Obstructive sleep apnea (adult) (pediatric): Secondary | ICD-10-CM | POA: Diagnosis not present

## 2016-03-11 DIAGNOSIS — G4733 Obstructive sleep apnea (adult) (pediatric): Secondary | ICD-10-CM | POA: Diagnosis not present

## 2016-04-20 DIAGNOSIS — G4733 Obstructive sleep apnea (adult) (pediatric): Secondary | ICD-10-CM | POA: Diagnosis not present

## 2016-04-29 ENCOUNTER — Other Ambulatory Visit: Payer: Self-pay

## 2016-04-29 VITALS — BP 104/70 | HR 88 | Resp 16 | Ht 62.0 in | Wt 230.5 lb

## 2016-04-29 DIAGNOSIS — R739 Hyperglycemia, unspecified: Secondary | ICD-10-CM

## 2016-04-29 NOTE — Patient Outreach (Signed)
[facility]) Care Management   04/29/2016  Heather Burnett 09-Mar-1971 [id number]  Heather Burnett is an 45 y.o. female.   Member seen for follow up office visit for Link to Wellness program for self management of Prediabetes  Subjective: Member states that she had been doing good until last week when she got overheated and went to Urgent Care with dehydration.  States that she had been working in her house nightly to repair water damage from a leaking pipe.  State her blood pressure was low and they have her holding her B/P medications for a few days.  States it has been stable at home.  States her blood sugars range from 90-140 at different times of the day.  States she had been exercising regularly until she started the home repairs and she plans to resume going to the gym when she is done with the repairs.  Objective:   Review of Systems  Musculoskeletal: Positive for joint pain.    Physical Exam Today's Vitals   04/29/16 1214 04/29/16 1219  BP: 104/70   Pulse: 88   Resp: 16   Height: 1.575 m (5\' 2" )   Weight: 230 lb 8 oz (104.554 kg)   SpO2: 98%   PainSc: 4  4    Encounter Medications:   Outpatient Encounter Prescriptions as of 04/29/2016  Medication Sig Note  . albuterol (ACCUNEB) 1.25 MG/3ML nebulizer solution Inhale 1 ampule into the lungs every 6 (six) hours as needed. Take 1.25mg  by nebulizer every 6 hours as needed for wheezing 04/10/2015: Received from: [place]: Inhale into the lungs.  Marland Kitchen aspirin 81 MG tablet Take 81 mg by mouth daily.   . cetirizine (ZYRTEC) 10 MG tablet Take 1 tablet by mouth daily. 04/10/2015: Received from: [place]: Take by mouth.  . Cholecalciferol (VITAMIN D3) 10000 UNITS capsule Take 10,000 Units by mouth daily.   . clindamycin (CLEOCIN T) 1 % SWAB Apply 1 applicator topically 2 (two) times daily as needed. 04/10/2015: Received from: [place]: Apply topically.  . cyclobenzaprine (FLEXERIL) 10 MG tablet Take 10 mg by mouth 3 (three) times daily as needed. Take one-half to one tablet three times a day as needed 04/10/2015: Received from: [place] AS NEEDED  . DHA-EPA-VITAMIN E PO Take 1 capsule by mouth daily. 04/10/2015: Received from: [place]: Take by mouth.  . DULoxetine (CYMBALTA) 60 MG capsule Take 60 mg by mouth daily.   . fluticasone (FLONASE) 50 MCG/ACT nasal spray Place 1 spray into the nose 2 (two) times daily as needed. 04/10/2015: Received from: [place]: Place into the nose.  Marland Kitchen Fluticasone-Salmeterol (ADVAIR DISKUS) 250-50 MCG/DOSE AEPB Inhale 1 puff into the lungs every 12 (twelve) hours as needed. 04/10/2015: Received from: [place]: Inhale into the lungs.  Marland Kitchen levothyroxine (SYNTHROID, LEVOTHROID) 25 MCG tablet Take 1 tablet by mouth daily. 04/10/2015: Received from: [place]: Take by mouth.  . metFORMIN (GLUCOPHAGE) 1000 MG tablet Take 0.5 tablets by mouth 2 (two) times daily with a meal. 04/10/2015: Received from: [place]: Take by mouth.  . metoprolol succinate (TOPROL-XL) 50 MG 24 hr tablet Take 1 tablet by mouth daily. 04/10/2015: Received from: [place]: TAKE ONE  TABLET BY MOUTH ONCE DAILY  . norethindrone (MICRONOR,CAMILA,ERRIN) 0.35 MG tablet Take 1 tablet by mouth daily. 04/10/2015: Received from: [place]: Take by mouth.  . phentermine 30 MG capsule Take 1 capsule by mouth daily. Take 1 capsule daily for 2 weeks then off for 2 weeks 04/10/2015: Received from: [place]: Take 1 capsule by mouth every morning 2 weeks on and 2 weeks off.  . ranitidine (V-R ACID  REDUCER) 75 MG tablet Take 1 tablet by mouth daily as needed. 04/10/2015: Received from: [place]: Take by mouth.  . spironolactone (ALDACTONE) 25 MG tablet Take 1 tablet by mouth 2 (two) times daily. 04/10/2015: Received from: [place]: Take by mouth.  . tretinoin (RETIN-A) 0.025 % cream Apply 1 application topically at bedtime. 04/10/2015: Received from: [place]: Apply topically.  Marland Kitchen atorvastatin (LIPITOR) 10 MG tablet Take 1 tablet by mouth daily. Taking 04/10/2015: Received from: [place]: Take by mouth.   No facility-administered encounter medications on file as of 04/29/2016.    Functional Status:   In your present state of health, do you have any difficulty performing the following activities: 04/29/2016 12/25/2015  Hearing? N N  Vision? N N  Difficulty concentrating or making decisions? N N  Walking or climbing stairs? N N  Dressing or bathing? N N  Doing errands, shopping? N N    Fall/Depression Screening:    PHQ 2/9 Scores 04/29/2016 12/25/2015 07/31/2015 04/10/2015  PHQ - 2 Score 1 1 0 0    Assessment:  Member seen for follow up office visit for Link to Wellness program for self management of  prediabetes. Member is meeting diabetes self -management goals with last hemoglobin A1C 5.7% on 07/11/15. Member to see endocrinologist 04/30/16 She reports checking her CBGs daily with ranges of 90-140. She has been exercising 5 days a week until last 2 weeks when she has been very active in evening repairing damage in her home.  Plan:  Plan to eat 30-45 GM (2-3) servings of carbohydrate a meal and 15 GM for snacks.  Plan to eat protein with snacks Plan to check blood sugar daily either fasting or 1 -2hrs after a meal.  Goals of 80-130 fasting and 140 after meals Plan exercise 5 times a week for 45-60 minutes Plan to complete EMMI programs by 07/19/16 Plan to  drink more fluids Plan to keep appointment with MD on 04/30/16 Plan to return to Link to Wellness on 07/29/16 at 12 PM [facility] CM Care Plan Problem One        Most Recent Value   Care Plan Problem One  Potential for elevated blood sugars as evidenced by hemoglobin A1C of 5.7% related to dx of Type 2 DM   Role Documenting the Problem One  Care Management Coordinator   Care Plan for Problem One  Active   THN Long Term Goal (31-90 days)  Member will maintain hemoglobin A1C at or below 6.5 for the next 90 days   THN Long Term Goal Start Date  04/29/16 [Hemoglobin A1C 5.7 to be rechecked 3/101/7]   Interventions for Problem One Long Term Goal   Reinforced on CHO counting and portion control,Instructed to drink adequate amounts of fluids during the hot weather, Instructed to keep her appt with Dr. Eddie Dibbles 04/30/16 Reinforced on importance of regular exercise and glycemic control, Reviewed sugar goals, Instructed on  foot care    Peter Garter RN, [facility] Care Management Coordinator-Link to [place] Management [phone]

## 2016-04-29 NOTE — Patient Instructions (Signed)
1. Plan to eat 30-45 GM (2-3) servings of carbohydrate a meal and 15 GM for snacks.  Plan to eat protein with snacks 2. Plan to check blood sugar daily either fasting or 1 -2hrs after a meal.  Goals of 80-130 fasting and 140 after meals 3. Plan exercise 5 times a week for 45-60 minutes 4. Plan to complete EMMI programs by 07/19/16 5. Plan to drink more fluids 6. Plan to keep appointment with MD on 04/30/16 7. Plan to return to Link to Wellness on 07/29/16 at 12 PM

## 2016-04-30 DIAGNOSIS — I10 Essential (primary) hypertension: Secondary | ICD-10-CM | POA: Diagnosis not present

## 2016-04-30 DIAGNOSIS — E785 Hyperlipidemia, unspecified: Secondary | ICD-10-CM | POA: Diagnosis not present

## 2016-04-30 DIAGNOSIS — E282 Polycystic ovarian syndrome: Secondary | ICD-10-CM | POA: Diagnosis not present

## 2016-05-15 DIAGNOSIS — H52223 Regular astigmatism, bilateral: Secondary | ICD-10-CM | POA: Diagnosis not present

## 2016-05-29 DIAGNOSIS — Z1389 Encounter for screening for other disorder: Secondary | ICD-10-CM | POA: Diagnosis not present

## 2016-05-29 DIAGNOSIS — Z01419 Encounter for gynecological examination (general) (routine) without abnormal findings: Secondary | ICD-10-CM | POA: Diagnosis not present

## 2016-06-05 DIAGNOSIS — G4733 Obstructive sleep apnea (adult) (pediatric): Secondary | ICD-10-CM | POA: Diagnosis not present

## 2016-06-05 DIAGNOSIS — E1142 Type 2 diabetes mellitus with diabetic polyneuropathy: Secondary | ICD-10-CM | POA: Diagnosis not present

## 2016-06-12 DIAGNOSIS — Z85820 Personal history of malignant melanoma of skin: Secondary | ICD-10-CM | POA: Diagnosis not present

## 2016-06-12 DIAGNOSIS — Z08 Encounter for follow-up examination after completed treatment for malignant neoplasm: Secondary | ICD-10-CM | POA: Diagnosis not present

## 2016-06-12 DIAGNOSIS — Z1283 Encounter for screening for malignant neoplasm of skin: Secondary | ICD-10-CM | POA: Diagnosis not present

## 2016-07-09 DIAGNOSIS — M47817 Spondylosis without myelopathy or radiculopathy, lumbosacral region: Secondary | ICD-10-CM | POA: Diagnosis not present

## 2016-07-09 DIAGNOSIS — G629 Polyneuropathy, unspecified: Secondary | ICD-10-CM | POA: Diagnosis not present

## 2016-07-09 DIAGNOSIS — M961 Postlaminectomy syndrome, not elsewhere classified: Secondary | ICD-10-CM | POA: Diagnosis not present

## 2016-07-09 DIAGNOSIS — M79604 Pain in right leg: Secondary | ICD-10-CM | POA: Diagnosis not present

## 2016-07-10 DIAGNOSIS — E559 Vitamin D deficiency, unspecified: Secondary | ICD-10-CM | POA: Diagnosis not present

## 2016-07-10 DIAGNOSIS — I10 Essential (primary) hypertension: Secondary | ICD-10-CM | POA: Diagnosis not present

## 2016-07-10 DIAGNOSIS — E282 Polycystic ovarian syndrome: Secondary | ICD-10-CM | POA: Diagnosis not present

## 2016-07-10 DIAGNOSIS — D680 Von Willebrand's disease: Secondary | ICD-10-CM | POA: Diagnosis not present

## 2016-07-24 DIAGNOSIS — M7662 Achilles tendinitis, left leg: Secondary | ICD-10-CM | POA: Diagnosis not present

## 2016-07-24 DIAGNOSIS — M205X2 Other deformities of toe(s) (acquired), left foot: Secondary | ICD-10-CM | POA: Diagnosis not present

## 2016-07-24 DIAGNOSIS — S92412A Displaced fracture of proximal phalanx of left great toe, initial encounter for closed fracture: Secondary | ICD-10-CM | POA: Diagnosis not present

## 2016-07-24 DIAGNOSIS — M79672 Pain in left foot: Secondary | ICD-10-CM | POA: Diagnosis not present

## 2016-07-29 ENCOUNTER — Other Ambulatory Visit: Payer: Self-pay

## 2016-07-29 VITALS — BP 108/84 | HR 88 | Resp 16 | Ht 62.0 in | Wt 232.9 lb

## 2016-07-29 DIAGNOSIS — M5137 Other intervertebral disc degeneration, lumbosacral region: Secondary | ICD-10-CM | POA: Diagnosis not present

## 2016-07-29 DIAGNOSIS — M47817 Spondylosis without myelopathy or radiculopathy, lumbosacral region: Secondary | ICD-10-CM | POA: Diagnosis not present

## 2016-07-29 DIAGNOSIS — E119 Type 2 diabetes mellitus without complications: Secondary | ICD-10-CM

## 2016-07-29 DIAGNOSIS — M1288 Other specific arthropathies, not elsewhere classified, other specified site: Secondary | ICD-10-CM | POA: Diagnosis not present

## 2016-07-29 NOTE — Patient Outreach (Signed)
[facility]) Care Management   07/29/2016  Heather Burnett 1971-07-17 [id number]  Heather Burnett is an 45 y.o. female.   Member seen for follow up office visit for Link to Wellness program for self management of Prediabetes  Subjective: Member states that she broke her lt foot stepping off of a ladder .  States she is to have foot surgery on 11/3 and she might be out of work for 12 weeks.  States that her hemoglobin A1C was 5.9% when she saw Dr.Paul in July.  States she had been going to [employer] and the gym before she injured her foot.  States she is trying to watch the CHO in her diet and is drinking more water now.  Objective:   Review of Systems  Musculoskeletal: Positive for back pain and joint pain.  Wearing walking cast/boot on lt foot  Physical Exam Today's Vitals   07/29/16 1213 07/29/16 1221  BP: 108/84   Pulse: 88   Resp: 16   SpO2: 98%   Weight: 232 lb 14.4 oz (105.6 kg)   Height: 1.575 m (5\' 2" )   PainSc: 6  7    Encounter Medications:   Outpatient Encounter Prescriptions as of 07/29/2016  Medication Sig Note  . albuterol (ACCUNEB) 1.25 MG/3ML nebulizer solution Inhale 1 ampule into the lungs every 6 (six) hours as needed. Take 1.25mg  by nebulizer every 6 hours as needed for wheezing 04/10/2015: Received from: [place]: Inhale into the lungs.  Marland Kitchen aspirin 81 MG tablet Take 81 mg by mouth daily.   . cetirizine (ZYRTEC) 10 MG tablet Take 1 tablet by mouth daily. 04/10/2015: Received from: [place]: Take by mouth.  . Cholecalciferol (VITAMIN D3) 10000 UNITS capsule Take 10,000 Units by mouth daily.   . clindamycin (CLEOCIN T) 1 % SWAB Apply 1 applicator topically 2 (two) times daily as needed. 04/10/2015: Received from: [place]: Apply topically.  . cyclobenzaprine (FLEXERIL) 10 MG tablet Take 10 mg by mouth 3 (three) times daily as needed. Take  one-half to one tablet three times a day as needed 04/10/2015: Received from: [place] AS NEEDED  . DHA-EPA-VITAMIN E PO Take 1 capsule by mouth daily. 04/10/2015: Received from: [place]: Take by mouth.  . DULoxetine (CYMBALTA) 60 MG capsule Take 60 mg by mouth daily.   . fluticasone (FLONASE) 50 MCG/ACT nasal spray Place 1 spray into the nose 2 (two) times daily as needed. 04/10/2015: Received from: [place]: Place into the nose.  Marland Kitchen Fluticasone-Salmeterol (ADVAIR DISKUS) 250-50 MCG/DOSE AEPB Inhale 1 puff into the lungs every 12 (twelve) hours as needed. 04/10/2015: Received from: [place]: Inhale into the lungs.  Marland Kitchen levothyroxine (SYNTHROID, LEVOTHROID) 25 MCG tablet Take 1 tablet by mouth daily. 04/10/2015: Received from: [place]: Take by mouth.  . metFORMIN (GLUCOPHAGE) 1000 MG tablet Take 0.5 tablets by mouth 2 (two) times daily with a meal. 04/10/2015: Received from: [place]: Take by mouth.  . metoprolol succinate (TOPROL-XL) 50 MG 24 hr tablet Take 1 tablet by mouth daily. 04/10/2015: Received from: [place]: TAKE ONE TABLET BY MOUTH ONCE DAILY  . norethindrone (MICRONOR,CAMILA,ERRIN) 0.35 MG tablet Take 1 tablet by mouth daily. 04/10/2015: Received  from: [place]: Take by mouth.  . ranitidine (V-R ACID REDUCER) 75 MG tablet Take 1 tablet by mouth daily as needed. 04/10/2015: Received from: [place]: Take by mouth.  . spironolactone (ALDACTONE) 25 MG tablet Take 1 tablet by mouth 2 (two) times daily. 04/10/2015: Received from: [place]: Take by mouth.  . tretinoin (RETIN-A) 0.025 % cream Apply 1 application topically  at bedtime. 04/10/2015: Received from: [place]: Apply topically.  Marland Kitchen atorvastatin (LIPITOR) 10 MG tablet Take 1 tablet by mouth daily. Taking 04/10/2015: Received from: [place]: Take by mouth.  . phentermine 30 MG capsule Take 1 capsule by mouth daily. Take 1 capsule daily for 2 weeks then off for 2 weeks 04/10/2015: Received from: [place]: Take 1 capsule by mouth every morning 2 weeks on and 2 weeks off.   No facility-administered encounter medications on file as of 07/29/2016.     Functional Status:   In your present state of health, do you have any difficulty performing the following activities: 07/29/2016 04/29/2016  Hearing? N N  Vision? N N  Difficulty concentrating or making decisions? N N  Walking or climbing stairs? N N  Dressing or bathing? N N  Doing errands, shopping? N N  Some recent data might be hidden    Fall/Depression Screening:    PHQ 2/9 Scores 07/29/2016 04/29/2016 12/25/2015 07/31/2015 04/10/2015  PHQ - 2 Score 0 1 1 0 0    Assessment:  Member seen for follow up office visit for Link to Wellness program for self management of  prediabetes. Member is meeting diabetes self -management goals with last hemoglobin A1C 5.9% on 04/30/16. She reports checking her CBGs 2-3 times a week with ranges of 90-140. Member has not been exercising since injuring her lt foot.  Plan: Plan to eat 30-45 GM (2-3) servings of carbohydrate a meal and 15 GM for snacks.  Plan to eat protein with snacks Plan to check blood sugar daily either fasting or 1 -2hrs after a meal.  Goals of 80-130 fasting and 140 after meals Plan to do resistance band exercises while recovering from foot surgery Plan to keep appointment with MD on 08/13/16 Plan to enroll in the [facility] program    Plan to return to Link to Wellness on 11/11/16 at 12 PM  [facility] CM Care Plan Problem One   Flowsheet Row Most Recent Value   Care Plan Problem One  Potential for elevated blood sugars as evidenced by hemoglobin A1C of 5.7% related to dx of Type 2 DM  Role Documenting the Problem One  Care Management Coordinator  Care Plan for Problem One  Active  THN Long Term Goal (31-90 days)  Member will maintain hemoglobin A1C at or below 6.5 for the next 90 days  THN Long Term Goal Start Date  07/29/16 [Hemoglobin A1C 5.9%]  Interventions for Problem One Long Term Goal   Reinforced on CHO counting and portion control, Instructed to try to do resistance exercises while she is recovering from foot surgery, Reviewed sugar goals, Instructed on the [facility] program and given handout with contact information to enroll in the program    Peter Garter RN, [facility] Care Management Coordinator-Link to [place] Management [phone]

## 2016-07-29 NOTE — Patient Instructions (Signed)
1. Plan to eat 30-45 GM (2-3) servings of carbohydrate a meal and 15 GM for snacks.  Plan to eat protein with snacks 2. Plan to check blood sugar daily either fasting or 1 -2hrs after a meal.  Goals of 80-130 fasting and 140 after meals 3. Plan to do resistance band exercises while recovering from foot surgery 4. Plan to keep appointment with MD on 08/13/16 5. Plan to enroll in the [facility] program 6. Plan to return to Link to Wellness on 11/11/16 at 12 PM

## 2016-08-11 DIAGNOSIS — I10 Essential (primary) hypertension: Secondary | ICD-10-CM | POA: Diagnosis not present

## 2016-08-11 DIAGNOSIS — D680 Von Willebrand's disease: Secondary | ICD-10-CM | POA: Diagnosis not present

## 2016-08-11 DIAGNOSIS — E049 Nontoxic goiter, unspecified: Secondary | ICD-10-CM | POA: Diagnosis not present

## 2016-08-11 DIAGNOSIS — E559 Vitamin D deficiency, unspecified: Secondary | ICD-10-CM | POA: Diagnosis not present

## 2016-08-17 ENCOUNTER — Encounter
Admission: RE | Admit: 2016-08-17 | Discharge: 2016-08-17 | Disposition: A | Discharge status: Home / Self Care | Payer: 59 | Source: Ambulatory Visit | Attending: Podiatry | Admitting: Podiatry

## 2016-08-17 DIAGNOSIS — E119 Type 2 diabetes mellitus without complications: Secondary | ICD-10-CM | POA: Insufficient documentation

## 2016-08-17 DIAGNOSIS — I10 Essential (primary) hypertension: Secondary | ICD-10-CM | POA: Diagnosis not present

## 2016-08-17 DIAGNOSIS — Z01818 Encounter for other preprocedural examination: Secondary | ICD-10-CM | POA: Insufficient documentation

## 2016-08-17 DIAGNOSIS — M205X2 Other deformities of toe(s) (acquired), left foot: Secondary | ICD-10-CM | POA: Diagnosis not present

## 2016-08-17 DIAGNOSIS — M7662 Achilles tendinitis, left leg: Secondary | ICD-10-CM | POA: Diagnosis not present

## 2016-08-17 DIAGNOSIS — S92412D Displaced fracture of proximal phalanx of left great toe, subsequent encounter for fracture with routine healing: Secondary | ICD-10-CM | POA: Diagnosis not present

## 2016-08-17 DIAGNOSIS — M76822 Posterior tibial tendinitis, left leg: Secondary | ICD-10-CM | POA: Diagnosis not present

## 2016-08-17 HISTORY — DX: Major depressive disorder, single episode, unspecified: F32.9

## 2016-08-17 HISTORY — DX: Hypothyroidism, unspecified: E03.9

## 2016-08-17 HISTORY — DX: Headache, unspecified: R51.9

## 2016-08-17 HISTORY — DX: Headache: R51

## 2016-08-17 HISTORY — DX: Anxiety disorder, unspecified: F41.9

## 2016-08-17 HISTORY — DX: Polyneuropathy, unspecified: G62.9

## 2016-08-17 HISTORY — DX: Sleep apnea, unspecified: G47.30

## 2016-08-17 HISTORY — DX: Von Willebrand's disease: D68.0

## 2016-08-17 HISTORY — DX: Family history of other specified conditions: Z84.89

## 2016-08-17 HISTORY — DX: Other cervical disc degeneration, unspecified cervical region: M50.30

## 2016-08-17 HISTORY — DX: Depression, unspecified: F32.A

## 2016-08-17 HISTORY — DX: Malignant (primary) neoplasm, unspecified: C80.1

## 2016-08-17 LAB — BASIC METABOLIC PANEL
Anion gap: 9 (ref 5–15)
BUN: 11 mg/dL (ref 6–20)
CO2: 26 mmol/L (ref 22–32)
Calcium: 9.3 mg/dL (ref 8.9–10.3)
Chloride: 98 mmol/L — ABNORMAL LOW (ref 101–111)
Creatinine, Ser: 0.71 mg/dL (ref 0.44–1.00)
GFR calc Af Amer: >60 mL/min (ref >60)
GFR calc non Af Amer: >60 mL/min (ref >60)
Glucose, Bld: 95 mg/dL (ref 65–99)
Potassium: 3.6 mmol/L (ref 3.5–5.1)
Sodium: 133 mmol/L — ABNORMAL LOW (ref 135–145)

## 2016-08-17 LAB — DIFFERENTIAL
Basophils Absolute: 0.1 10*3/uL (ref 0–0.1)
Basophils Relative: 0 %
Eosinophils Absolute: 0.3 10*3/uL (ref 0–0.7)
Eosinophils Relative: 2 %
Lymphocytes Relative: 39 %
Lymphs Abs: 5.2 10*3/uL — ABNORMAL HIGH (ref 1.0–3.6)
Monocytes Absolute: 0.8 10*3/uL (ref 0.2–0.9)
Monocytes Relative: 6 %
Neutro Abs: 7 10*3/uL — ABNORMAL HIGH (ref 1.4–6.5)
Neutrophils Relative %: 53 %

## 2016-08-17 LAB — CBC
HCT: 40.7 % (ref 35.0–47.0)
Hemoglobin: 13.7 g/dL (ref 12.0–16.0)
MCH: 29.9 pg (ref 26.0–34.0)
MCHC: 33.5 g/dL (ref 32.0–36.0)
MCV: 89.1 fL (ref 80.0–100.0)
Platelets: 355 10*3/uL (ref 150–440)
RBC: 4.57 MIL/uL (ref 3.80–5.20)
RDW: 15.1 % — ABNORMAL HIGH (ref 11.5–14.5)
WBC: 13.3 10*3/uL — ABNORMAL HIGH (ref 3.6–11.0)

## 2016-08-17 LAB — SURGICAL PCR SCREEN
MRSA, PCR: NEGATIVE
Staphylococcus aureus: NEGATIVE

## 2016-08-17 NOTE — Patient Instructions (Signed)
  Your procedure is scheduled [id number] Nov. 3 , 2017. Report to Same Day Surgery. To find out your arrival time please call [phone] between 1PM - 3PM on Thursday Nov.2, 2017.  Remember: Instructions that are not followed completely may result in serious medical risk, up to and including death, or upon the discretion of your surgeon and anesthesiologist your surgery may need to be rescheduled.    _x___ 1. Do not eat food or drink liquids after midnight. No gum chewing or hard candies.     ____ 2. No Alcohol for 24 hours before or after surgery.   ____ 3. Bring all medications with you on the day of surgery if instructed.    __x__ 4. Notify your doctor if there is any change in your medical condition     (cold, fever, infections).    _____ 5. No smoking 24 hours prior to surgery.     Do not wear jewelry, make-up, hairpins, clips or nail polish.  Do not wear lotions, powders, or perfumes.   Do not shave 48 hours prior to surgery. Men may shave face and neck.  Do not bring valuables to the hospital.    [facility] is not responsible for any belongings or valuables.               Contacts, dentures or bridgework may not be worn into surgery.  Leave your suitcase in the car. After surgery it may be brought to your room.  For patients admitted to the hospital, discharge time is determined by your treatment team.   Patients discharged the day of surgery will not be allowed to drive home.    Please read over the following fact sheets that you were given:   [facility] Preparing for Surgery  _x___ Take these medicines the morning of surgery with A SIP OF WATER:    1. ranitidine (V-R ACID REDUCER)  2. DULoxetine (CYMBALTA)   ____ Fleet Enema (as directed)   _x___ Use CHG Soap as directed on instruction sheet  _x__ Use nebulizer and inhaler on the day of surgery.  _x___ Stop metformin 2 days prior to surgery on this Wednesday.    ____ Take 1/2 of usual insulin dose the night  before surgery and none on the morning of surgery.   __x__ Stop aspirin as directed by Dr. Cleda Mccreedy.  __x__ Stop Anti-inflammatories such as Advil, Aleve, Ibuprofen, Motrin, Naproxen, Naprosyn, Goodies powders or aspirin products. OK to take Tylenol.   __x__ Stop supplements:DHA-EPA-VITAMIN E  until after surgery.    __x__ Bring C-Pap to the hospital.

## 2016-08-17 NOTE — Pre-Procedure Instructions (Addendum)
CLEARED LOW TO MOD RISK BY DR Eustace Moore Caryl Comes 08/11/16 AND DR Rosey Bath OK TO PROCEED.

## 2016-08-19 ENCOUNTER — Other Ambulatory Visit: Payer: Self-pay | Admitting: Podiatry

## 2016-08-19 DIAGNOSIS — M7662 Achilles tendinitis, left leg: Secondary | ICD-10-CM

## 2016-08-21 ENCOUNTER — Encounter
Admission: RE | Disposition: A | Discharge status: Home / Self Care | Payer: Self-pay | Source: Ambulatory Visit | Attending: Podiatry

## 2016-08-21 ENCOUNTER — Ambulatory Visit: Payer: 59 | Admitting: Anesthesiology

## 2016-08-21 ENCOUNTER — Encounter: Payer: Self-pay | Admitting: *Deleted

## 2016-08-21 ENCOUNTER — Ambulatory Visit
Admission: RE | Admit: 2016-08-21 | Discharge: 2016-08-21 | Disposition: A | Discharge status: Home / Self Care | Payer: 59 | Source: Ambulatory Visit | Attending: Podiatry | Admitting: Podiatry

## 2016-08-21 DIAGNOSIS — M2021 Hallux rigidus, right foot: Secondary | ICD-10-CM | POA: Diagnosis not present

## 2016-08-21 DIAGNOSIS — S92412D Displaced fracture of proximal phalanx of left great toe, subsequent encounter for fracture with routine healing: Secondary | ICD-10-CM | POA: Diagnosis not present

## 2016-08-21 DIAGNOSIS — K219 Gastro-esophageal reflux disease without esophagitis: Secondary | ICD-10-CM | POA: Diagnosis not present

## 2016-08-21 DIAGNOSIS — M2012 Hallux valgus (acquired), left foot: Secondary | ICD-10-CM | POA: Diagnosis not present

## 2016-08-21 DIAGNOSIS — E039 Hypothyroidism, unspecified: Secondary | ICD-10-CM | POA: Diagnosis not present

## 2016-08-21 DIAGNOSIS — M1990 Unspecified osteoarthritis, unspecified site: Secondary | ICD-10-CM | POA: Diagnosis not present

## 2016-08-21 DIAGNOSIS — M205X2 Other deformities of toe(s) (acquired), left foot: Secondary | ICD-10-CM | POA: Diagnosis not present

## 2016-08-21 DIAGNOSIS — E119 Type 2 diabetes mellitus without complications: Secondary | ICD-10-CM | POA: Insufficient documentation

## 2016-08-21 HISTORY — PX: HALLUX VALGUS CHEILECTOMY: SHX6625

## 2016-08-21 LAB — GLUCOSE, CAPILLARY: Glucose-Capillary: 110 mg/dL — ABNORMAL HIGH (ref 65–99)

## 2016-08-21 LAB — POCT PREGNANCY, URINE: Preg Test, Ur: NEGATIVE

## 2016-08-21 SURGERY — CHEILECTOMY, GREAT TOE, WITH IMPLANT INSERTION
Anesthesia: General | Laterality: Left | Wound class: Clean

## 2016-08-21 MED ORDER — PROMETHAZINE HCL 25 MG/ML IJ SOLN
12.5000 mg | Freq: Once | INTRAMUSCULAR | Status: AC
  Administered 2016-08-21: 12.5 mg via INTRAVENOUS

## 2016-08-21 MED ORDER — NEOMYCIN-POLYMYXIN B GU 40-200000 IR SOLN
Status: AC
  Filled 2016-08-21: qty 2

## 2016-08-21 MED ORDER — BUPIVACAINE HCL 0.5 % IJ SOLN
INTRAMUSCULAR | Status: DC | PRN
  Administered 2016-08-21: 10 mL

## 2016-08-21 MED ORDER — PHENYLEPHRINE HCL 10 MG/ML IJ SOLN
INTRAMUSCULAR | Status: DC | PRN
  Administered 2016-08-21 (×4): 100 ug via INTRAVENOUS

## 2016-08-21 MED ORDER — BUPIVACAINE HCL (PF) 0.5 % IJ SOLN
INTRAMUSCULAR | Status: AC
  Filled 2016-08-21: qty 30

## 2016-08-21 MED ORDER — ONDANSETRON HCL 4 MG/2ML IJ SOLN
INTRAMUSCULAR | Status: DC | PRN
  Administered 2016-08-21: 4 mg via INTRAVENOUS

## 2016-08-21 MED ORDER — OXYCODONE HCL 5 MG/5ML PO SOLN: 5.0000 mg | Freq: Once | ORAL | Status: DC | PRN

## 2016-08-21 MED ORDER — LIDOCAINE HCL (PF) 1 % IJ SOLN
INTRAMUSCULAR | Status: AC
  Filled 2016-08-21: qty 30

## 2016-08-21 MED ORDER — OXYCODONE-ACETAMINOPHEN 5-325 MG PO TABS: 1.0000 | ORAL_TABLET | ORAL | 0 refills | Status: DC | PRN

## 2016-08-21 MED ORDER — OXYCODONE HCL 5 MG PO TABS
ORAL_TABLET | ORAL | Status: AC
  Filled 2016-08-21: qty 1

## 2016-08-21 MED ORDER — NEOMYCIN-POLYMYXIN B GU 40-200000 IR SOLN
Status: DC | PRN
  Administered 2016-08-21: 2 mL

## 2016-08-21 MED ORDER — FENTANYL CITRATE (PF) 100 MCG/2ML IJ SOLN: 25.0000 ug | INTRAMUSCULAR | Status: DC | PRN

## 2016-08-21 MED ORDER — FENTANYL CITRATE (PF) 100 MCG/2ML IJ SOLN: INTRAMUSCULAR | Status: DC | PRN

## 2016-08-21 MED ORDER — PROPOFOL 10 MG/ML IV BOLUS
INTRAVENOUS | Status: DC | PRN
  Administered 2016-08-21: 200 mg via INTRAVENOUS

## 2016-08-21 MED ORDER — FENTANYL CITRATE (PF) 100 MCG/2ML IJ SOLN
INTRAMUSCULAR | Status: DC | PRN
  Administered 2016-08-21: 100 ug via INTRAVENOUS

## 2016-08-21 MED ORDER — ONDANSETRON HCL 4 MG/2ML IJ SOLN
4.0000 mg | Freq: Once | INTRAMUSCULAR | Status: AC
  Administered 2016-08-21: 4 mg via INTRAVENOUS

## 2016-08-21 MED ORDER — MIDAZOLAM HCL 2 MG/2ML IJ SOLN
INTRAMUSCULAR | Status: DC | PRN
  Administered 2016-08-21: 2 mg via INTRAVENOUS

## 2016-08-21 MED ORDER — LACTATED RINGERS IV SOLN
INTRAVENOUS | Status: DC | PRN
Start: 2016-08-21 — End: 2016-08-21
  Administered 2016-08-21 (×2): via INTRAVENOUS

## 2016-08-21 MED ORDER — LIDOCAINE HCL (CARDIAC) 20 MG/ML IV SOLN
INTRAVENOUS | Status: DC | PRN
  Administered 2016-08-21: 100 mg via INTRAVENOUS

## 2016-08-21 MED ORDER — ONDANSETRON HCL 4 MG/2ML IJ SOLN
INTRAMUSCULAR | Status: AC
  Filled 2016-08-21: qty 2

## 2016-08-21 MED ORDER — DEXAMETHASONE SODIUM PHOSPHATE 10 MG/ML IJ SOLN
INTRAMUSCULAR | Status: DC | PRN
  Administered 2016-08-21: 5 mg via INTRAVENOUS

## 2016-08-21 MED ORDER — PROMETHAZINE HCL 25 MG/ML IJ SOLN
INTRAMUSCULAR | Status: AC
  Filled 2016-08-21: qty 1

## 2016-08-21 MED ORDER — OXYCODONE HCL 5 MG PO TABS: 5.0000 mg | ORAL_TABLET | Freq: Once | ORAL | Status: DC | PRN

## 2016-08-21 MED ORDER — SODIUM CHLORIDE 0.9 % IV SOLN
INTRAVENOUS | Status: DC
  Administered 2016-08-21: 08:00:00 via INTRAVENOUS

## 2016-08-21 SURGICAL SUPPLY — 45 items
BAG COUNTER SPONGE EZ (MISCELLANEOUS) IMPLANT
BANDAGE ACE 3X5.8 VEL STRL LF (GAUZE/BANDAGES/DRESSINGS) ×2 IMPLANT
BANDAGE ELASTIC 4 LF NS (GAUZE/BANDAGES/DRESSINGS) IMPLANT
BANDAGE STRETCH 3X4.1 STRL (GAUZE/BANDAGES/DRESSINGS) IMPLANT
BLADE MED AGGRESSIVE (BLADE) ×2 IMPLANT
BLADE SURG 15 STRL LF DISP TIS (BLADE) ×2 IMPLANT
BLADE SURG 15 STRL SS (BLADE) ×2
BLADE SURG MINI STRL (BLADE) ×2 IMPLANT
BNDG ESMARK 4X12 TAN STRL LF (GAUZE/BANDAGES/DRESSINGS) ×2 IMPLANT
BNDG GAUZE 4.5X4.1 6PLY STRL (MISCELLANEOUS) IMPLANT
CUFF TOURN 18 STER (MISCELLANEOUS) ×2 IMPLANT
CUFF TOURN DUAL PL 12 NO SLV (MISCELLANEOUS) IMPLANT
DRAPE FLUOR MINI C-ARM 54X84 (DRAPES) ×2 IMPLANT
DURAPREP 26ML APPLICATOR (WOUND CARE) ×2 IMPLANT
ELECT REM PT RETURN 9FT ADLT (ELECTROSURGICAL) ×2
ELECTRODE REM PT RTRN 9FT ADLT (ELECTROSURGICAL) ×1 IMPLANT
GAUZE PETRO XEROFOAM 1X8 (MISCELLANEOUS) ×2 IMPLANT
GAUZE SPONGE 4X4 12PLY STRL (GAUZE/BANDAGES/DRESSINGS) ×2 IMPLANT
GAUZE STRETCH 2X75IN STRL (MISCELLANEOUS) ×2 IMPLANT
GLOVE BIO SURGEON STRL SZ7.5 (GLOVE) ×6 IMPLANT
GLOVE INDICATOR 8.0 STRL GRN (GLOVE) ×4 IMPLANT
GOWN STRL REUS W/ TWL LRG LVL3 (GOWN DISPOSABLE) ×2 IMPLANT
GOWN STRL REUS W/TWL LRG LVL3 (GOWN DISPOSABLE) ×2
LABEL OR SOLS (LABEL) ×2 IMPLANT
NEEDLE FILTER BLUNT 18X 1/2SAF (NEEDLE) ×1
NEEDLE FILTER BLUNT 18X1 1/2 (NEEDLE) ×1 IMPLANT
NEEDLE HYPO 25X1 1.5 SAFETY (NEEDLE) ×2 IMPLANT
NS IRRIG 500ML POUR BTL (IV SOLUTION) ×2 IMPLANT
PACK EXTREMITY ARMC (MISCELLANEOUS) ×2 IMPLANT
PAD CAST CTTN 4X4 STRL (SOFTGOODS) IMPLANT
PADDING CAST COTTON 4X4 STRL (SOFTGOODS)
PENCIL ELECTRO HAND CTR (MISCELLANEOUS) IMPLANT
RASP SM TEAR CROSS CUT (RASP) ×2 IMPLANT
SOL PREP PVP 2OZ (MISCELLANEOUS) ×2
SOLUTION PREP PVP 2OZ (MISCELLANEOUS) ×1 IMPLANT
SPLINT FAST PLASTER 5X30 (CAST SUPPLIES)
SPLINT PLASTER CAST FAST 5X30 (CAST SUPPLIES) IMPLANT
STOCKINETTE STRL 6IN 960660 (GAUZE/BANDAGES/DRESSINGS) ×2 IMPLANT
STRAP SAFETY BODY (MISCELLANEOUS) ×2 IMPLANT
STRIP CLOSURE SKIN 1/4X4 (GAUZE/BANDAGES/DRESSINGS) ×2 IMPLANT
SUT ETHILON 5-0 FS-2 18 BLK (SUTURE) ×2 IMPLANT
SUT VIC AB 4-0 FS2 27 (SUTURE) ×2 IMPLANT
SWABSTK COMLB BENZOIN TINCTURE (MISCELLANEOUS) ×2 IMPLANT
SYRINGE 10CC LL (SYRINGE) ×4 IMPLANT
WIRE Z .045 C-WIRE SPADE TIP (WIRE) ×2 IMPLANT

## 2016-08-21 NOTE — H&P (Signed)
History and physical in the chart is reviewed. No interval changes. Patient stable for surgery  

## 2016-08-21 NOTE — Op Note (Signed)
Date of operation: 08/21/2016.  Surgeon: Durward Fortes DPM.  Preoperative diagnosis: Hallux limitus with fracture proximal phalanx great toe.  Postoperative diagnosis: Same.  Procedure: Cheilectomy left great toe joint.  Anesthesia: LMA with local.  Hemostasis: Pneumatic tourniquet left ankle 250 mmHg.  Estimated blood loss: Less than 5 cc.  Implants: None.  Complications: None apparent.  Operative indications: This is a 45 year old female with a history of painful hallux limitus with some spurs on her left great toe joint. Recently had an injury and sustained a fracture off of the spur on the base of the proximal phalanx of the great toe. Decision was made to pursue surgical intervention to remove the fracture fragment and spurs around the joint to increase motion.  Operative procedure: Patient was taken to the operating room and placed on the table in the supine position. Following satisfactory LMA anesthesia the left forefoot was anesthetized with 10 cc of 0.5% bupivacaine plain around the first metatarsal. A pneumatic tourniquet was applied at the level of the left ankle and the foot was prepped and draped in the usual sterile fashion. The foot was exsanguinated and the tourniquet inflated to 250 mmHg. Attention was then directed to the dorsal aspect of the left foot where an approximate 4 cm linear incision was made coursing proximal to distal over the first metatarsal and first metatarsophalangeal joint. The incision was deepened via sharp and blunt dissection with care taken to cauterize bleeders and retract neurovascular structures. At the level of the joint a linear capsulotomy was performed and the capsular and periosteal tissues reflected off of the head of the first metatarsal and base of the proximal phalanx. Significant dorsal spurring was noted off of the first metatarsal as well as the base of the great toe with an identifiable fracture fragment as well as several joint mice  along the lateral aspect of the joint. Some significant loss of articular cartilage was noted along the lateral and dorsal aspect of the first metatarsal head. Using a sagittal saw the medial eminence of the first metatarsal was removed as well as the dorsal spurring and dorsal third of the first metatarsal head including most of the damaged cartilage. Using a ronguer the dorsal spurs off the base of the proximal phalanx were removed along with the fracture fragment and the joint mice fragments along the lateral aspect of the joint. All raw bony edges were rasped smooth with a power rasp. The remaining cartilaginous defect along the lateral aspect of the first metatarsal head was then drilled with a 0.045 inch K wire to produce multiple holes to promote bleeding to cover the defect. Good range of motion with no crepitus was noted at the first MTPJ at this point. Wound was then flushed with copious amounts of sterile saline and closed using 4-0 Vicryl for all layers from capsular and periosteal tissue to deep and superficial subcutaneous followed by skin closure. Tincture of benzoin and Steri-Strips were applied followed by Xeroform and a sterile bandage. Tourniquet was released and blood flow noted to return immediately to the left foot and all digits. Patient tolerated the procedure and anesthesia well and was transported to the PACU with vital signs stable and in good condition.

## 2016-08-21 NOTE — Anesthesia Preprocedure Evaluation (Signed)
Anesthesia Evaluation  Patient identified by MRN, date of birth, ID band Patient awake    Reviewed: Allergy & Precautions, H&P , NPO status , Patient's Chart, lab work & pertinent test results  History of Anesthesia Complications (+) Family history of anesthesia reaction and history of anesthetic complications  Airway Mallampati: III  TM Distance: >3 FB Neck ROM: full    Dental  (+) Poor Dentition   Pulmonary neg shortness of breath, asthma , sleep apnea and Continuous Positive Airway Pressure Ventilation , former smoker,    Pulmonary exam normal breath sounds clear to auscultation       Cardiovascular Exercise Tolerance: Good hypertension, (-) angina(-) Past MI and (-) DOE Normal cardiovascular exam Rhythm:regular Rate:Normal     Neuro/Psych  Headaches, PSYCHIATRIC DISORDERS Anxiety Depression    GI/Hepatic Neg liver ROS, GERD  Medicated and Controlled,  Endo/Other  diabetes, Type 2Hypothyroidism   Renal/GU      Musculoskeletal  (+) Arthritis ,   Abdominal   Peds  Hematology negative hematology ROS (+)   Anesthesia Other Findings Past Medical History: No date: Allergy No date: Anxiety No date: Cancer [place])     Comment: melanoma on my chest No date: DDD (degenerative disc disease), cervical     Comment: and lower back No date: Depression No date: Diabetes mellitus without complication (HCC) No date: Family history of adverse reaction to anesthes*     Comment: mother almost died with anesthesia in the               past, hard to wake up and got real sick on my               stomach vomiting, now she receives local               anethesia. No date: GERD (gastroesophageal reflux disease) No date: Headache     Comment: history of migraine No date: Hyperlipidemia No date: Hypertension No date: Hypothyroidism 2016: Neuropathy (HCC) No date: PCOS (polycystic ovarian syndrome) No date: Sleep apnea No date:  Thyroid disease 2012: Von Willebrand disease ([place])  Past Surgical History: 2010: BREAST BIOPSY Right     Comment: core, non malignant No date: CHOLECYSTECTOMY 10/2014: HEMORRHOID SURGERY No date: SPINE SURGERY No date: TONSILLECTOMY AND ADENOIDECTOMY  BMI    Body Mass Index:  42.43 kg/m      Reproductive/Obstetrics negative OB ROS                             Anesthesia Physical Anesthesia Plan  ASA: III  Anesthesia Plan: General LMA   Post-op Pain Management:    Induction:   Airway Management Planned:   Additional Equipment:   Intra-op Plan:   Post-operative Plan:   Informed Consent: I have reviewed the patients History and Physical, chart, labs and discussed the procedure including the risks, benefits and alternatives for the proposed anesthesia with the patient or authorized representative who has indicated his/her understanding and acceptance.   Dental Advisory Given  Plan Discussed with: Anesthesiologist, CRNA and Surgeon  Anesthesia Plan Comments:         Anesthesia Quick Evaluation

## 2016-08-21 NOTE — Anesthesia Postprocedure Evaluation (Signed)
Anesthesia Post Note  Patient: Heather Burnett  Procedure(s) Performed: Procedure(s) (LRB): HALLUX VALGUS CHEILECTOMY (Left)  Patient location during evaluation: PACU Anesthesia Type: General Level of consciousness: awake and alert Pain management: pain level controlled Vital Signs Assessment: post-procedure vital signs reviewed and stable Respiratory status: spontaneous breathing, nonlabored ventilation, respiratory function stable and patient connected to nasal cannula oxygen Cardiovascular status: blood pressure returned to baseline and stable Postop Assessment: no signs of nausea or vomiting Anesthetic complications: no    Last Vitals:  Vitals:   08/21/16 1110 08/21/16 1133  BP: 111/79 116/61  Pulse: 64 83  Resp: 18 16  Temp: 36.4 C 36.5 C    Last Pain:  Vitals:   08/21/16 1135  TempSrc:   PainSc: 2                  Precious Haws [place]

## 2016-08-21 NOTE — Discharge Instructions (Addendum)
1. Elevate left lower extremity on pillows.  2. Keep the left foot clean, dry, and do not remove the bandage.  3. Sponge bathe only left lower extremity.  Or. Wear surgical shoe on the left foot whenever walking or standing.  5. Take one pain pill, Percocet, every 4 hours if needed for pain      AMBULATORY SURGERY  DISCHARGE INSTRUCTIONS   1) The drugs that you were given will stay in your system until tomorrow so for the next 24 hours you should not:  A) Drive an automobile B) Make any legal decisions C) Drink any alcoholic beverage   2) You may resume regular meals tomorrow.  Today it is better to start with liquids and gradually work up to solid foods.  You may eat anything you prefer, but it is better to start with liquids, then soup and crackers, and gradually work up to solid foods.   3) Please notify your doctor immediately if you have any unusual bleeding, trouble breathing, redness and pain at the surgery site, drainage, fever, or pain not relieved by medication.    4) Additional Instructions:You may wear the boot after surgery instead of a surgical shoe.    Please contact your physician with any problems or Same Day Surgery at [phone], Monday through Friday 6 am to 4 pm, or [place] at [facility] number at [phone].

## 2016-08-21 NOTE — Transfer of Care (Signed)
Immediate Anesthesia Transfer of Care Note  Patient: Heather Burnett  Procedure(s) Performed: Procedure(s): HALLUX VALGUS CHEILECTOMY (Left)  Patient Location: PACU  Anesthesia Type:General  Level of Consciousness: sedated  Airway & Oxygen Therapy: Patient Spontanous Breathing and Patient connected to face mask oxygen  Post-op Assessment: Report given to RN and Post -op Vital signs reviewed and stable  Post vital signs: Reviewed and stable  Last Vitals:  Vitals:   08/21/16 0754  BP: 137/73  Pulse: 87  Resp: 17  Temp: 36.6 C    Last Pain:  Vitals:   08/21/16 0754  TempSrc: Oral  PainSc: 5          Complications: No apparent anesthesia complications

## 2016-08-21 NOTE — OR Nursing (Signed)
Patient states she has been wearing a boot preoperatively as she has damaged her achilles tendon and the boot gives extra supports.  She also states that she spoke with Dr. Cleda Mccreedy in his office preoperatively that he stated she could wear it after surgery as well.  Telephone call placed in to the operating room to ask if this is correct and dr. Cleda Mccreedy verbally agreed that she could wear her boot post operatively.

## 2016-08-21 NOTE — Interval H&P Note (Signed)
History and Physical Interval Note:  08/21/2016 8:14 AM  Heather Burnett  has presented today for surgery, with the diagnosis of M20.5X2 S92.412A  The various methods of treatment have been discussed with the patient and family. After consideration of risks, benefits and other options for treatment, the patient has consented to  Procedure(s): HALLUX VALGUS CHEILECTOMY (Left) as a surgical intervention .  The patient's history has been reviewed, patient examined, no change in status, stable for surgery.  I have reviewed the patient's chart and labs.  Questions were answered to the patient's satisfaction.     Durward Fortes

## 2016-08-21 NOTE — Anesthesia Procedure Notes (Signed)
Procedure Name: LMA Insertion Date/Time: 08/21/2016 9:03 AM Performed by: Nelda Marseille Pre-anesthesia Checklist: Patient identified, Patient being monitored, Timeout performed, Emergency Drugs available and Suction available Patient Re-evaluated:Patient Re-evaluated prior to inductionOxygen Delivery Method: Circle system utilized Preoxygenation: Pre-oxygenation with 100% oxygen Intubation Type: IV induction Ventilation: Mask ventilation without difficulty LMA: LMA inserted LMA Size: 3.5 Tube type: Oral Number of attempts: 1 Placement Confirmation: positive ETCO2 and breath sounds checked- equal and bilateral Tube secured with: Tape Dental Injury: Teeth and Oropharynx as per pre-operative assessment

## 2016-08-26 ENCOUNTER — Ambulatory Visit
Admission: RE | Admit: 2016-08-26 | Discharge: 2016-08-26 | Disposition: A | Discharge status: Home / Self Care | Payer: 59 | Source: Ambulatory Visit | Attending: Podiatry | Admitting: Podiatry

## 2016-08-26 ENCOUNTER — Encounter: Payer: Self-pay | Admitting: Podiatry

## 2016-08-26 DIAGNOSIS — M7662 Achilles tendinitis, left leg: Secondary | ICD-10-CM | POA: Diagnosis not present

## 2016-08-26 DIAGNOSIS — S86012A Strain of left Achilles tendon, initial encounter: Secondary | ICD-10-CM | POA: Insufficient documentation

## 2016-08-26 DIAGNOSIS — X58XXXA Exposure to other specified factors, initial encounter: Secondary | ICD-10-CM | POA: Diagnosis not present

## 2016-08-28 DIAGNOSIS — M899 Disorder of bone, unspecified: Secondary | ICD-10-CM | POA: Diagnosis not present

## 2016-08-28 DIAGNOSIS — M949 Disorder of cartilage, unspecified: Secondary | ICD-10-CM | POA: Diagnosis not present

## 2016-08-31 ENCOUNTER — Ambulatory Visit: Payer: 59

## 2016-09-02 DIAGNOSIS — J343 Hypertrophy of nasal turbinates: Secondary | ICD-10-CM | POA: Diagnosis not present

## 2016-09-02 DIAGNOSIS — Z6841 Body Mass Index (BMI) 40.0 and over, adult: Secondary | ICD-10-CM | POA: Diagnosis not present

## 2016-09-02 DIAGNOSIS — Z1389 Encounter for screening for other disorder: Secondary | ICD-10-CM | POA: Diagnosis not present

## 2016-09-02 DIAGNOSIS — R070 Pain in throat: Secondary | ICD-10-CM | POA: Diagnosis not present

## 2016-09-02 DIAGNOSIS — G4700 Insomnia, unspecified: Secondary | ICD-10-CM | POA: Diagnosis not present

## 2016-09-02 DIAGNOSIS — J069 Acute upper respiratory infection, unspecified: Secondary | ICD-10-CM | POA: Diagnosis not present

## 2016-09-15 DIAGNOSIS — M47817 Spondylosis without myelopathy or radiculopathy, lumbosacral region: Secondary | ICD-10-CM | POA: Diagnosis not present

## 2016-09-15 DIAGNOSIS — M5137 Other intervertebral disc degeneration, lumbosacral region: Secondary | ICD-10-CM | POA: Diagnosis not present

## 2016-10-02 DIAGNOSIS — M47817 Spondylosis without myelopathy or radiculopathy, lumbosacral region: Secondary | ICD-10-CM | POA: Diagnosis not present

## 2016-10-17 DIAGNOSIS — G4733 Obstructive sleep apnea (adult) (pediatric): Secondary | ICD-10-CM | POA: Diagnosis not present

## 2016-10-21 ENCOUNTER — Ambulatory Visit: Payer: Self-pay | Admitting: Physician Assistant

## 2016-10-21 VITALS — BP 110/84 | HR 80 | Temp 98.1°F

## 2016-10-21 DIAGNOSIS — J209 Acute bronchitis, unspecified: Secondary | ICD-10-CM

## 2016-10-21 MED ORDER — FLUCONAZOLE 150 MG PO TABS: 150.0000 mg | ORAL_TABLET | Freq: Once | ORAL | 0 refills | Status: AC

## 2016-10-21 MED ORDER — METHYLPREDNISOLONE 4 MG PO TBPK: ORAL_TABLET | ORAL | 0 refills | Status: DC

## 2016-10-21 MED ORDER — CEFDINIR 300 MG PO CAPS: 300.0000 mg | ORAL_CAPSULE | Freq: Two times a day (BID) | ORAL | 0 refills | Status: DC

## 2016-10-21 NOTE — Progress Notes (Signed)
S: C/o runny nose and congestion for 10-12 days, no fever, chills, cp/sob, v/d; mucus was green this am cough is dry and hacking, using inhaler and nebulizer, finished zpack and steroid pack 2-3 days ago, felt better while on the medication but sx worsened once she was done with the pills, states her daughter was diagnosed with pneumonia  Using otc meds:   O: PE: vitals wnl, nad, perrl eomi, normocephalic, tms dull, nasal mucosa red and swollen, throat injected, neck supple no lymph, lungs c t a, cv rrr, neuro intact  A:  Acute uri   P: drink fluids, continue regular meds , use otc meds of choice, return if not improving in 5 days, return earlier if worsening , omnicef, medrol dose pack, diflucan if needed

## 2016-10-26 DIAGNOSIS — I10 Essential (primary) hypertension: Secondary | ICD-10-CM | POA: Diagnosis not present

## 2016-10-26 DIAGNOSIS — D72820 Lymphocytosis (symptomatic): Secondary | ICD-10-CM | POA: Diagnosis not present

## 2016-10-26 DIAGNOSIS — D680 Von Willebrand's disease: Secondary | ICD-10-CM | POA: Diagnosis not present

## 2016-10-26 DIAGNOSIS — E282 Polycystic ovarian syndrome: Secondary | ICD-10-CM | POA: Diagnosis not present

## 2016-10-26 DIAGNOSIS — E559 Vitamin D deficiency, unspecified: Secondary | ICD-10-CM | POA: Diagnosis not present

## 2016-10-29 DIAGNOSIS — E282 Polycystic ovarian syndrome: Secondary | ICD-10-CM | POA: Diagnosis not present

## 2016-10-29 DIAGNOSIS — E785 Hyperlipidemia, unspecified: Secondary | ICD-10-CM | POA: Diagnosis not present

## 2016-10-29 DIAGNOSIS — R7303 Prediabetes: Secondary | ICD-10-CM | POA: Diagnosis not present

## 2016-11-11 ENCOUNTER — Ambulatory Visit: Payer: Self-pay

## 2016-11-13 DIAGNOSIS — M47816 Spondylosis without myelopathy or radiculopathy, lumbar region: Secondary | ICD-10-CM | POA: Diagnosis not present

## 2016-11-13 DIAGNOSIS — R05 Cough: Secondary | ICD-10-CM | POA: Diagnosis not present

## 2016-11-13 DIAGNOSIS — Z6841 Body Mass Index (BMI) 40.0 and over, adult: Secondary | ICD-10-CM | POA: Diagnosis not present

## 2016-11-13 DIAGNOSIS — E236 Other disorders of pituitary gland: Secondary | ICD-10-CM | POA: Diagnosis not present

## 2016-11-13 DIAGNOSIS — I10 Essential (primary) hypertension: Secondary | ICD-10-CM | POA: Diagnosis not present

## 2016-11-25 ENCOUNTER — Other Ambulatory Visit: Payer: Self-pay | Admitting: Internal Medicine

## 2016-11-25 DIAGNOSIS — Z1231 Encounter for screening mammogram for malignant neoplasm of breast: Secondary | ICD-10-CM

## 2016-12-18 ENCOUNTER — Ambulatory Visit
Admission: RE | Admit: 2016-12-18 | Discharge: 2016-12-18 | Disposition: A | Discharge status: Home / Self Care | Payer: 59 | Source: Ambulatory Visit | Attending: Internal Medicine | Admitting: Internal Medicine

## 2016-12-18 DIAGNOSIS — Z1231 Encounter for screening mammogram for malignant neoplasm of breast: Secondary | ICD-10-CM | POA: Insufficient documentation

## 2017-02-09 DIAGNOSIS — D72820 Lymphocytosis (symptomatic): Secondary | ICD-10-CM | POA: Diagnosis not present

## 2017-02-09 DIAGNOSIS — E559 Vitamin D deficiency, unspecified: Secondary | ICD-10-CM | POA: Diagnosis not present

## 2017-02-09 DIAGNOSIS — I10 Essential (primary) hypertension: Secondary | ICD-10-CM | POA: Diagnosis not present

## 2017-02-12 DIAGNOSIS — E784 Other hyperlipidemia: Secondary | ICD-10-CM | POA: Diagnosis not present

## 2017-02-12 DIAGNOSIS — D680 Von Willebrand's disease: Secondary | ICD-10-CM | POA: Diagnosis not present

## 2017-02-12 DIAGNOSIS — R739 Hyperglycemia, unspecified: Secondary | ICD-10-CM | POA: Diagnosis not present

## 2017-02-12 DIAGNOSIS — R3129 Other microscopic hematuria: Secondary | ICD-10-CM | POA: Insufficient documentation

## 2017-02-12 DIAGNOSIS — I10 Essential (primary) hypertension: Secondary | ICD-10-CM | POA: Diagnosis not present

## 2017-04-28 DIAGNOSIS — E039 Hypothyroidism, unspecified: Secondary | ICD-10-CM | POA: Diagnosis not present

## 2017-04-28 DIAGNOSIS — Z87898 Personal history of other specified conditions: Secondary | ICD-10-CM | POA: Diagnosis not present

## 2017-04-28 DIAGNOSIS — E282 Polycystic ovarian syndrome: Secondary | ICD-10-CM | POA: Diagnosis not present

## 2017-04-28 DIAGNOSIS — R7303 Prediabetes: Secondary | ICD-10-CM | POA: Diagnosis not present

## 2017-05-04 DIAGNOSIS — E282 Polycystic ovarian syndrome: Secondary | ICD-10-CM | POA: Diagnosis not present

## 2017-05-04 DIAGNOSIS — R7303 Prediabetes: Secondary | ICD-10-CM | POA: Diagnosis not present

## 2017-05-04 DIAGNOSIS — E039 Hypothyroidism, unspecified: Secondary | ICD-10-CM | POA: Diagnosis not present

## 2017-05-04 DIAGNOSIS — E785 Hyperlipidemia, unspecified: Secondary | ICD-10-CM | POA: Diagnosis not present

## 2017-05-14 DIAGNOSIS — M545 Low back pain: Secondary | ICD-10-CM | POA: Diagnosis not present

## 2017-05-14 DIAGNOSIS — G894 Chronic pain syndrome: Secondary | ICD-10-CM | POA: Diagnosis not present

## 2017-05-14 DIAGNOSIS — M5136 Other intervertebral disc degeneration, lumbar region: Secondary | ICD-10-CM | POA: Diagnosis not present

## 2017-05-14 DIAGNOSIS — M47816 Spondylosis without myelopathy or radiculopathy, lumbar region: Secondary | ICD-10-CM | POA: Diagnosis not present

## 2017-06-04 DIAGNOSIS — Z1283 Encounter for screening for malignant neoplasm of skin: Secondary | ICD-10-CM | POA: Diagnosis not present

## 2017-06-04 DIAGNOSIS — Z08 Encounter for follow-up examination after completed treatment for malignant neoplasm: Secondary | ICD-10-CM | POA: Diagnosis not present

## 2017-06-04 DIAGNOSIS — L700 Acne vulgaris: Secondary | ICD-10-CM | POA: Diagnosis not present

## 2017-06-04 DIAGNOSIS — Z85820 Personal history of malignant melanoma of skin: Secondary | ICD-10-CM | POA: Diagnosis not present

## 2017-06-10 DIAGNOSIS — G4733 Obstructive sleep apnea (adult) (pediatric): Secondary | ICD-10-CM | POA: Diagnosis not present

## 2017-07-07 DIAGNOSIS — Z124 Encounter for screening for malignant neoplasm of cervix: Secondary | ICD-10-CM | POA: Diagnosis not present

## 2017-07-07 DIAGNOSIS — Z01419 Encounter for gynecological examination (general) (routine) without abnormal findings: Secondary | ICD-10-CM | POA: Diagnosis not present

## 2017-07-07 DIAGNOSIS — Z1151 Encounter for screening for human papillomavirus (HPV): Secondary | ICD-10-CM | POA: Diagnosis not present

## 2017-07-09 DIAGNOSIS — H524 Presbyopia: Secondary | ICD-10-CM | POA: Diagnosis not present

## 2017-08-20 DIAGNOSIS — R292 Abnormal reflex: Secondary | ICD-10-CM | POA: Diagnosis not present

## 2017-08-20 DIAGNOSIS — R200 Anesthesia of skin: Secondary | ICD-10-CM | POA: Diagnosis not present

## 2017-08-20 DIAGNOSIS — Z6841 Body Mass Index (BMI) 40.0 and over, adult: Secondary | ICD-10-CM | POA: Diagnosis not present

## 2017-08-20 DIAGNOSIS — M542 Cervicalgia: Secondary | ICD-10-CM | POA: Diagnosis not present

## 2017-08-20 DIAGNOSIS — R202 Paresthesia of skin: Secondary | ICD-10-CM | POA: Diagnosis not present

## 2017-08-21 DIAGNOSIS — R110 Nausea: Secondary | ICD-10-CM | POA: Diagnosis not present

## 2017-08-21 DIAGNOSIS — J060 Acute laryngopharyngitis: Secondary | ICD-10-CM | POA: Diagnosis not present

## 2017-08-21 DIAGNOSIS — J40 Bronchitis, not specified as acute or chronic: Secondary | ICD-10-CM | POA: Diagnosis not present

## 2017-08-23 ENCOUNTER — Other Ambulatory Visit: Payer: Self-pay | Admitting: Neurosurgery

## 2017-08-23 DIAGNOSIS — R292 Abnormal reflex: Secondary | ICD-10-CM

## 2017-09-03 ENCOUNTER — Ambulatory Visit
Admission: RE | Admit: 2017-09-03 | Discharge: 2017-09-03 | Disposition: A | Discharge status: Home / Self Care | Payer: 59 | Source: Ambulatory Visit | Attending: Neurosurgery | Admitting: Neurosurgery

## 2017-09-03 DIAGNOSIS — M5021 Other cervical disc displacement,  high cervical region: Secondary | ICD-10-CM | POA: Diagnosis not present

## 2017-09-03 DIAGNOSIS — R292 Abnormal reflex: Secondary | ICD-10-CM | POA: Diagnosis not present

## 2017-09-03 DIAGNOSIS — M50221 Other cervical disc displacement at C4-C5 level: Secondary | ICD-10-CM | POA: Diagnosis not present

## 2017-09-03 DIAGNOSIS — M50223 Other cervical disc displacement at C6-C7 level: Secondary | ICD-10-CM | POA: Diagnosis not present

## 2017-09-03 DIAGNOSIS — M50222 Other cervical disc displacement at C5-C6 level: Secondary | ICD-10-CM | POA: Diagnosis not present

## 2017-09-14 DIAGNOSIS — G4733 Obstructive sleep apnea (adult) (pediatric): Secondary | ICD-10-CM | POA: Diagnosis not present

## 2017-09-28 ENCOUNTER — Other Ambulatory Visit: Payer: Self-pay

## 2017-09-28 NOTE — Patient Outreach (Signed)
[facility]) Care Management  09/28/2017  ZANAI MALLARI 1971-06-18 [id number]  Case closed in [place].  Member is being followed by the [employer] program Member enrolled in an external program. Peter Garter RN, [facility] Care Management Coordinator-Link to [place] Management [phone]

## 2017-12-20 ENCOUNTER — Encounter: Payer: Self-pay | Admitting: Cardiovascular Disease

## 2017-12-20 ENCOUNTER — Ambulatory Visit: Payer: No Typology Code available for payment source | Admitting: Cardiovascular Disease

## 2017-12-20 ENCOUNTER — Other Ambulatory Visit: Payer: Self-pay

## 2017-12-20 VITALS — BP 100/70 | HR 78 | Ht 62.0 in | Wt 224.6 lb

## 2017-12-20 DIAGNOSIS — R079 Chest pain, unspecified: Secondary | ICD-10-CM

## 2017-12-20 DIAGNOSIS — R002 Palpitations: Secondary | ICD-10-CM

## 2017-12-20 DIAGNOSIS — R0609 Other forms of dyspnea: Secondary | ICD-10-CM | POA: Diagnosis not present

## 2017-12-20 DIAGNOSIS — I519 Heart disease, unspecified: Secondary | ICD-10-CM | POA: Diagnosis not present

## 2017-12-20 DIAGNOSIS — I10 Essential (primary) hypertension: Secondary | ICD-10-CM | POA: Diagnosis not present

## 2017-12-20 DIAGNOSIS — E119 Type 2 diabetes mellitus without complications: Secondary | ICD-10-CM | POA: Diagnosis not present

## 2017-12-20 DIAGNOSIS — E785 Hyperlipidemia, unspecified: Secondary | ICD-10-CM | POA: Diagnosis not present

## 2017-12-20 DIAGNOSIS — R0600 Dyspnea, unspecified: Secondary | ICD-10-CM

## 2017-12-20 NOTE — Patient Instructions (Addendum)
Your physician wants you to follow-up in: 6-8 weeks with Dr Bronson Ing     Your physician has requested that you have an echocardiogram. Echocardiography is a painless test that uses sound waves to create images of your heart. It provides your doctor with information about the size and shape of your heart and how well your heart's chambers and valves are working. This procedure takes approximately one hour. There are no restrictions for this procedure.   Your physician has requested that you have en exercise stress myoview. For further information please visit [email or web address]. Please follow instruction sheet, as given.HOLD METOPROL THE MORNING OF TEST    Your physician recommends that you continue on your current medications as directed. Please refer to the Current Medication list given to you today.    If you need a refill on your cardiac medications before your next appointment, please call your pharmacy.      No lab work ordered today        Thank you for choosing [facility] !

## 2017-12-20 NOTE — Progress Notes (Signed)
CARDIOLOGY CONSULT NOTE  Patient ID: VALLARIE FEI MRN: [id number] DOB/AGE: 1971-07-10 47 y.o.  Admit date: (Not on file) Primary Physician: Adin Hector, MD Referring Physician: Dr. Caryl Comes  Reason for Consultation: Exertional dyspnea and chest pain  HPI: Heather Burnett is a 47 y.o. female who is being seen today for the evaluation of exertional dyspnea and chest pain at the request of Adin Hector, MD.   Past medical history includes hypertension, hyperlipidemia, obesity, type 2 diabetes mellitus, and sleep apnea.  I reviewed the electronic medical record and PCP notes dated 11/12/17.  She also reportedly has a history of palpitations dating back to October 2005 associate with chest pain.  She apparently had a normal 24-hour Holter monitor and stress echocardiogram demonstrating normal left ventricular systolic function and trivial to mild mitral regurgitation.  She underwent repeat cardiac testing in October 2011 with again no cardiac arrhythmias and normal cardiac function.  The most recent echocardiogram report I find is dated 07/26/15 which showed normal left ventricular systolic function, LVEF greater than 55%.  There was grade 2 diastolic dysfunction.  ECG performed in the office today which I ordered and personally interpreted demonstrates normal sinus rhythm with no ischemic ST segment or T-wave abnormalities, nor any arrhythmias.  She began experiencing retrosternal chest pain and exertional dyspnea about 6 months ago.  She has lost about 21 pounds and expected her symptoms to improve.  She has upper retrosternal chest tightness which radiates into both shoulders and the back of her left shoulder.  It resolves with rest.  She denies orthopnea and paroxysmal nocturnal dyspnea.  She describes sensations of feeling cold in her hands and feet for the past several months.  She also has occasional sharp shooting pains down her left leg.  She does not feel she is over  exerting herself.  She uses an elliptical and treadmill to exercise about 5 days/week.  She is trying to adhere to a keto diet.  She also has palpitations while at work.  She said she has had anxiety and depression but is now currently doing well with Cymbalta.  Her father, Heather Burnett, is also my patient.    Allergies  Allergen Reactions  . Liraglutide -Weight Management     Other reaction(s): Other (See Comments) GI upset  . Sorbitan Other (See Comments)  . Adhesive [Tape] Itching and Rash    Paper tape or coban is easier on the skin.  . Latex Rash  . Penicillins Rash    Current Outpatient Medications  Medication Sig Dispense Refill  . albuterol (ACCUNEB) 1.25 MG/3ML nebulizer solution Inhale 1 ampule into the lungs every 6 (six) hours as needed. Take 1.25mg  by nebulizer every 6 hours as needed for wheezing    . aspirin 81 MG tablet Take 81 mg by mouth daily.    Marland Kitchen atorvastatin (LIPITOR) 10 MG tablet Take 1 tablet by mouth daily at 6 PM. Taking    . cefdinir (OMNICEF) 300 MG capsule Take 1 capsule (300 mg total) by mouth 2 (two) times daily. 20 capsule 0  . cetirizine (ZYRTEC) 10 MG tablet Take 1 tablet by mouth daily as needed.     . Cholecalciferol (VITAMIN D3) 10000 UNITS capsule Take 10,000 Units by mouth daily.    . clindamycin (CLEOCIN T) 1 % SWAB Apply 1 applicator topically 2 (two) times daily as needed.    . cyclobenzaprine (FLEXERIL) 10 MG tablet Take 10 mg by mouth  3 (three) times daily as needed. Take one-half to one tablet three times a day as needed    . DHA-EPA-VITAMIN E PO Take 1 capsule by mouth daily.    . DULoxetine (CYMBALTA) 60 MG capsule Take 60 mg by mouth daily.    . fluticasone (FLONASE) 50 MCG/ACT nasal spray Place 1 spray into the nose 2 (two) times daily as needed.    . Fluticasone-Salmeterol (ADVAIR DISKUS) 250-50 MCG/DOSE AEPB Inhale 1 puff into the lungs every 12 (twelve) hours as needed.    Marland Kitchen levothyroxine (SYNTHROID, LEVOTHROID) 25 MCG tablet Take  1 tablet by mouth at bedtime.     . metFORMIN (GLUCOPHAGE) 1000 MG tablet Take 0.5 tablets by mouth 2 (two) times daily with a meal.    . methylPREDNISolone (MEDROL DOSEPAK) 4 MG TBPK tablet Take 6 pills on day one then decrease by 1 pill each day 21 tablet 0  . metoprolol succinate (TOPROL-XL) 50 MG 24 hr tablet Take 1 tablet by mouth at bedtime. Check BP prior to taking Metoprolol, if low hold the dose.  If SBP is elevated over 140 or DBP is above 90, pt and take 0.5 tablet.    . norethindrone (MICRONOR,CAMILA,ERRIN) 0.35 MG tablet Take 1 tablet by mouth daily.    Marland Kitchen spironolactone (ALDACTONE) 25 MG tablet Take 1 tablet by mouth 2 (two) times daily.    Marland Kitchen tretinoin (RETIN-A) 0.025 % cream Apply 1 application topically at bedtime as needed.      No current facility-administered medications for this visit.     Past Medical History:  Diagnosis Date  . Allergy   . Anxiety   . Cancer ([place])    melanoma on my chest  . DDD (degenerative disc disease), cervical    and lower back  . Depression   . Diabetes mellitus without complication ([place])   . Family history of adverse reaction to anesthesia    mother almost died with anesthesia in the past, hard to wake up and got real sick on my stomach vomiting, now she receives local anethesia.  Marland Kitchen GERD (gastroesophageal reflux disease)   . Headache    history of migraine  . Hyperlipidemia   . Hypertension   . Hypothyroidism   . Neuropathy 2016  . PCOS (polycystic ovarian syndrome)   . Sleep apnea   . Thyroid disease   . Von Willebrand disease ([place]) 2012    Past Surgical History:  Procedure Laterality Date  . BREAST BIOPSY Right 2010   NEG  . CHOLECYSTECTOMY    . HALLUX VALGUS CHEILECTOMY Left 08/21/2016   Procedure: HALLUX VALGUS CHEILECTOMY;  Surgeon: Sharlotte Alamo, DPM;  Location: [facility] ORS;  Service: Podiatry;  Laterality: Left;  . HEMORRHOID SURGERY  10/2014  . SPINE SURGERY    . TONSILLECTOMY AND ADENOIDECTOMY      Social History    Socioeconomic History  . Marital status: Divorced    Spouse name: Not on file  . Number of children: Not on file  . Years of education: Not on file  . Highest education level: Not on file  Social Needs  . Financial resource strain: Not on file  . Food insecurity - worry: Not on file  . Food insecurity - inability: Not on file  . Transportation needs - medical: Not on file  . Transportation needs - non-medical: Not on file  Occupational History  . Not on file  Tobacco Use  . Smoking status: Former Smoker    Years: 8.00  Types: Cigarettes    Last attempt to quit: 04/09/1994    Years since quitting: 23.7  . Smokeless tobacco: Never Used  Substance and Sexual Activity  . Alcohol use: No    Alcohol/week: 0.0 oz  . Drug use: No  . Sexual activity: Not on file  Other Topics Concern  . Not on file  Social History Narrative  . Not on file     No family history of premature CAD in 1st degree relatives.  No outpatient medications have been marked as taking for the 12/20/17 encounter (Office Visit) with Herminio Commons, MD.      Review of systems complete and found to be negative unless listed above in HPI    Physical exam Blood pressure 100/70, pulse 78, height 5\' 2"  (1.575 m), weight 224 lb 9.6 oz (101.9 kg), SpO2 96 %. General: NAD Neck: No JVD, no thyromegaly or thyroid nodule.  Lungs: Clear to auscultation bilaterally with normal respiratory effort. CV: Nondisplaced PMI. Regular rate and rhythm, normal S1/S2, no S3/S4, no murmur.  No peripheral edema.  No carotid bruit.    Abdomen: Soft, nontender, no distention.  Skin: Intact without lesions or rashes.  Neurologic: Alert and oriented x 3.  Psych: Normal affect. Extremities: No clubbing or cyanosis.  HEENT: Normal.   ECG: Most recent ECG reviewed.   Labs: Lab Results  Component Value Date/Time   K 3.6 08/17/2016 01:59 PM   BUN 11 08/17/2016 01:59 PM   CREATININE 0.71 08/17/2016 01:59 PM   ALT 18  07/11/2015 07:55 AM   TSH 0.944 07/11/2015 07:55 AM   HGB 13.7 08/17/2016 01:59 PM   HGB 14.7 05/18/2013 11:50 AM     Lipids: Lab Results  Component Value Date/Time   LDLCALC 69 07/11/2015 07:55 AM   CHOL 143 07/11/2015 07:55 AM   TRIG 167 (H) 07/11/2015 07:55 AM   HDL 41 07/11/2015 07:55 AM        ASSESSMENT AND PLAN:  1.  Exertional dyspnea and chest pain: Cardiovascular risk factors include hypertension and hyperlipidemia.  She also has grade 2 diastolic dysfunction.  I will obtain an exercise Myoview stress test to evaluate for ischemic heart disease.  I will obtain an echocardiogram to evaluate cardiac structure and function and to assess diastolic dysfunction grade.  2.  Hypertension: Controlled on present therapy.  No changes.  3.  Hyperlipidemia: I reviewed lipids performed on 11/05/17 which showed total cholesterol 129, triglycerides elevated at 232, LDL 47, HDL 35.8.  Currently on Lipitor 10 mg.  4.  Type 2 diabetes mellitus: Currently on metformin.  5.  Palpitations: I will await to see results of nuclear stress testing and to see if there are any provokable arrhythmias with exercise.  She does note that symptoms are exacerbated with anxiety and stress.  She is currently on Cymbalta.  I would consider Holter monitoring in the future but not at this time.   Disposition: Follow up in 6-8 weeks  Signed: Kate Sable, M.D., F.A.C.C.  12/20/2017, 10:32 AM

## 2017-12-23 ENCOUNTER — Other Ambulatory Visit: Payer: Self-pay | Admitting: Internal Medicine

## 2017-12-23 DIAGNOSIS — Z1231 Encounter for screening mammogram for malignant neoplasm of breast: Secondary | ICD-10-CM

## 2018-01-06 ENCOUNTER — Encounter: Payer: Self-pay | Admitting: Cardiovascular Disease

## 2018-01-07 ENCOUNTER — Encounter (HOSPITAL_COMMUNITY): Payer: Self-pay

## 2018-01-07 ENCOUNTER — Ambulatory Visit (HOSPITAL_BASED_OUTPATIENT_CLINIC_OR_DEPARTMENT_OTHER)
Admission: RE | Admit: 2018-01-07 | Discharge: 2018-01-07 | Disposition: A | Discharge status: Home / Self Care | Payer: No Typology Code available for payment source | Source: Ambulatory Visit | Attending: Cardiovascular Disease | Admitting: Cardiovascular Disease

## 2018-01-07 ENCOUNTER — Encounter (HOSPITAL_BASED_OUTPATIENT_CLINIC_OR_DEPARTMENT_OTHER)
Admission: RE | Admit: 2018-01-07 | Discharge: 2018-01-07 | Disposition: A | Discharge status: Home / Self Care | Payer: No Typology Code available for payment source | Source: Ambulatory Visit | Attending: Cardiovascular Disease | Admitting: Cardiovascular Disease

## 2018-01-07 ENCOUNTER — Encounter (HOSPITAL_COMMUNITY)
Admission: RE | Admit: 2018-01-07 | Discharge: 2018-01-07 | Disposition: A | Discharge status: Home / Self Care | Payer: No Typology Code available for payment source | Source: Ambulatory Visit | Attending: Cardiovascular Disease | Admitting: Cardiovascular Disease

## 2018-01-07 DIAGNOSIS — R0600 Dyspnea, unspecified: Secondary | ICD-10-CM

## 2018-01-07 DIAGNOSIS — E785 Hyperlipidemia, unspecified: Secondary | ICD-10-CM

## 2018-01-07 DIAGNOSIS — R0609 Other forms of dyspnea: Secondary | ICD-10-CM | POA: Diagnosis not present

## 2018-01-07 DIAGNOSIS — E119 Type 2 diabetes mellitus without complications: Secondary | ICD-10-CM | POA: Insufficient documentation

## 2018-01-07 DIAGNOSIS — R079 Chest pain, unspecified: Secondary | ICD-10-CM

## 2018-01-07 DIAGNOSIS — Z87891 Personal history of nicotine dependence: Secondary | ICD-10-CM | POA: Insufficient documentation

## 2018-01-07 DIAGNOSIS — I10 Essential (primary) hypertension: Secondary | ICD-10-CM

## 2018-01-07 LAB — NM MYOCAR MULTI W/SPECT W/WALL MOTION / EF
Estimated workload: 10.1 METS
Exercise duration (min): 7 min
Exercise duration (sec): 2 s
LV dias vol: 55 mL (ref 46–106)
LV sys vol: 16 mL
MPHR: 176 {beats}/min
Peak HR: 157 {beats}/min
Percent HR: 89 %
RATE: 0.45
RPE: 13
Rest HR: 86 {beats}/min
SDS: 4
SRS: 0
SSS: 4
TID: 1.13

## 2018-01-07 MED ORDER — TECHNETIUM TC 99M TETROFOSMIN IV KIT
10.0000 | PACK | Freq: Once | INTRAVENOUS | Status: AC | PRN
  Administered 2018-01-07: 10 via INTRAVENOUS

## 2018-01-07 MED ORDER — REGADENOSON 0.4 MG/5ML IV SOLN
INTRAVENOUS | Status: AC
  Filled 2018-01-07: qty 5

## 2018-01-07 MED ORDER — SODIUM CHLORIDE 0.9% FLUSH
INTRAVENOUS | Status: AC
  Administered 2018-01-07: 10 mL via INTRAVENOUS
  Filled 2018-01-07: qty 10

## 2018-01-07 MED ORDER — TECHNETIUM TC 99M TETROFOSMIN IV KIT
30.0000 | PACK | Freq: Once | INTRAVENOUS | Status: AC | PRN
  Administered 2018-01-07: 30 via INTRAVENOUS

## 2018-01-07 NOTE — Progress Notes (Signed)
*  PRELIMINARY RESULTS* Echocardiogram 2D Echocardiogram has been performed.  Leavy Cella 01/07/2018, 1:32 PM

## 2018-01-12 ENCOUNTER — Encounter: Payer: Self-pay | Admitting: Cardiovascular Disease

## 2018-01-14 ENCOUNTER — Ambulatory Visit
Admission: RE | Admit: 2018-01-14 | Discharge: 2018-01-14 | Disposition: A | Discharge status: Home / Self Care | Payer: No Typology Code available for payment source | Source: Ambulatory Visit | Attending: Internal Medicine | Admitting: Internal Medicine

## 2018-01-14 DIAGNOSIS — Z1231 Encounter for screening mammogram for malignant neoplasm of breast: Secondary | ICD-10-CM | POA: Insufficient documentation

## 2018-02-04 ENCOUNTER — Ambulatory Visit: Payer: Self-pay | Admitting: Cardiovascular Disease

## 2018-02-18 ENCOUNTER — Ambulatory Visit: Payer: No Typology Code available for payment source | Admitting: Cardiovascular Disease

## 2018-02-18 ENCOUNTER — Encounter: Payer: Self-pay | Admitting: Cardiovascular Disease

## 2018-02-18 VITALS — BP 128/84 | HR 115 | Ht 62.0 in | Wt 212.0 lb

## 2018-02-18 DIAGNOSIS — E119 Type 2 diabetes mellitus without complications: Secondary | ICD-10-CM

## 2018-02-18 DIAGNOSIS — I10 Essential (primary) hypertension: Secondary | ICD-10-CM

## 2018-02-18 DIAGNOSIS — G4733 Obstructive sleep apnea (adult) (pediatric): Secondary | ICD-10-CM

## 2018-02-18 DIAGNOSIS — R079 Chest pain, unspecified: Secondary | ICD-10-CM | POA: Diagnosis not present

## 2018-02-18 DIAGNOSIS — R002 Palpitations: Secondary | ICD-10-CM

## 2018-02-18 DIAGNOSIS — R0609 Other forms of dyspnea: Secondary | ICD-10-CM | POA: Diagnosis not present

## 2018-02-18 DIAGNOSIS — E785 Hyperlipidemia, unspecified: Secondary | ICD-10-CM

## 2018-02-18 DIAGNOSIS — Z9989 Dependence on other enabling machines and devices: Secondary | ICD-10-CM

## 2018-02-18 DIAGNOSIS — R0600 Dyspnea, unspecified: Secondary | ICD-10-CM

## 2018-02-18 NOTE — Patient Instructions (Addendum)
Medication Instructions:  Your physician recommends that you continue on your current medications as directed. Please refer to the Current Medication list given to you today.   Labwork: NONE   Testing/Procedures: Your physician has recommended that you wear an event monitor for 7 days. Event monitors are medical devices that record the heart's electrical activity. Doctors most often [place] these monitors to diagnose arrhythmias. Arrhythmias are problems with the speed or rhythm of the heartbeat. The monitor is a small, portable device. You can wear one while you do your normal daily activities. This is usually used to diagnose what is causing palpitations/syncope (passing out).    Follow-Up: Your physician recommends that you schedule a follow-up appointment in: 2 Months    Any Other Special Instructions Will Be Listed Below (If Applicable).     If you need a refill on your cardiac medications before your next appointment, please call your pharmacy.  Thank you for choosing [facility]!

## 2018-02-18 NOTE — Progress Notes (Addendum)
SUBJECTIVE: The patient returns for follow-up after undergoing cardiovascular testing performed for the evaluation of exertional dyspnea, chest pain, and palpitations.  Nuclear stress test was normal on 01/07/2018, EF 71%.  Echocardiogram demonstrated normal left ventricular systolic function, LVEF 60 to 65%.  Diastolic function could not be assessed.  Wall thickness was normal.  Past medical history includes hypertension, hyperlipidemia, obesity, type 2 diabetes mellitus, and sleep apnea.  She continues to experience palpitations and chest pains.  She has pains in the upper left part of her chest which she describes as a tightness and also beneath her left breast.  She describes a separate chest pressure which she describes as a "brick on my chest "in the central part of her chest.  Pains typically occur with exertion and is accompanied by palpitations and dyspnea.  She also has episodic palpitations while at rest.  She uses a heart rate monitor on her watch and it has been as high as 130 bpm while at rest.  She believes symptoms occur about twice per week.  She has awoken with palpitations as well. Heart rates have been recorded on her phone app.  She also has cervical spine and midthoracic pain.  She is interested in breast reduction surgery as she also feels that she has musculoskeletal pains from her back, neck, and chest related to this.  Mammogram on 01/14/2018 showed no evidence of malignancy.  I had her walk up and down our office and obtain an ECG which demonstrated sinus tachycardia, 147 bpm.  There was late R wave transition.  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 She lives with her aging parents.  She is divorced.  She has a 80 year old daughter (2019) who is a Engineer, building services in [place], [place].   Review of Systems: As per "subjective", otherwise negative.  Allergies  Allergen Reactions  . Liraglutide  -Weight Management     Other reaction(s): Other (See Comments) GI upset  . Sorbitan Other (See Comments)  . Adhesive [Tape] Itching and Rash    Paper tape or coban is easier on the skin.  . Latex Rash  . Penicillins Rash    Current Outpatient Medications  Medication Sig Dispense Refill  . albuterol (VENTOLIN HFA) 108 (90 Base) MCG/ACT inhaler Inhale 2 puffs into the lungs every 6 (six) hours as needed for wheezing or shortness of breath.    Marland Kitchen atorvastatin (LIPITOR) 10 MG tablet Take 1 tablet by mouth daily at 6 PM. Taking    . Biotin w/ Vitamins C & E (HAIR SKIN & NAILS GUMMIES PO) Take 1 capsule by mouth daily.    . busPIRone (BUSPAR) 15 MG tablet Take 0.5 tablets by mouth 2 (two) times daily.    . Butenafine HCl (MENTAX EX) Take by mouth.    . celecoxib (CELEBREX) 200 MG capsule Take 1 capsule by mouth 2 (two) times daily.  5  . cetirizine (ZYRTEC) 10 MG tablet Take 1 tablet by mouth daily as needed.     . chlorpheniramine (CHLOR-TRIMETON) 4 MG tablet Take 4 mg by mouth 2 (two) times daily as needed for allergies.    . clindamycin (CLEOCIN T) 1 % SWAB Apply 1 applicator topically 2 (two) times daily as needed.    . doxycycline (VIBRA-TABS) 100 MG tablet Take 100 mg by mouth as directed.    . DULoxetine (CYMBALTA) 60 MG capsule Take 60 mg by mouth daily.    Marland Kitchen  Fluticasone-Salmeterol (ADVAIR DISKUS) 250-50 MCG/DOSE AEPB Inhale 1 puff into the lungs every 12 (twelve) hours as needed.    . furosemide (LASIX) 40 MG tablet Take 1 tablet by mouth daily as needed.    . Homeopathic Products (LEG CRAMP COMPLEX PO) Take 1 tablet by mouth daily as needed.    Marland Kitchen l-methylfolate-B6-B12 (METANX) 3-35-2 MG TABS tablet Take 1 tablet by mouth 2 (two) times daily.    Marland Kitchen levothyroxine (SYNTHROID, LEVOTHROID) 25 MCG tablet Take 1 tablet by mouth at bedtime.     Marland Kitchen loratadine (CLARITIN) 10 MG tablet Take 10 mg by mouth daily.    . magnesium oxide (MAG-OX) 400 MG tablet Take 400 mg by mouth daily.    . metFORMIN  (GLUCOPHAGE) 1000 MG tablet Take 1,000 mg by mouth 2 (two) times daily with a meal.     . Methylcobalamin (METHYL B-12 PO) Take 1 tablet by mouth daily.    . metoprolol succinate (TOPROL-XL) 50 MG 24 hr tablet Take 1 tablet by mouth at bedtime. Check BP prior to taking Metoprolol, if low hold the dose.  If SBP is elevated over 140 or DBP is above 90, pt and take 0.5 tablet.    . mometasone (NASONEX) 50 MCG/ACT nasal spray Place 2 sprays into the nose daily.    . Potassium 99 MG TABS Take 1 tablet by mouth daily.    Marland Kitchen spironolactone (ALDACTONE) 25 MG tablet Take 1 tablet by mouth 2 (two) times daily.     No current facility-administered medications for this visit.     Past Medical History:  Diagnosis Date  . Allergy   . Anxiety   . Cancer ([place])    melanoma on my chest  . DDD (degenerative disc disease), cervical    and lower back  . Depression   . Diabetes mellitus without complication ([place])   . Family history of adverse reaction to anesthesia    mother almost died with anesthesia in the past, hard to wake up and got real sick on my stomach vomiting, now she receives local anethesia.  Marland Kitchen GERD (gastroesophageal reflux disease)   . Headache    history of migraine  . Hyperlipidemia   . Hypertension   . Hypothyroidism   . Neuropathy 2016  . PCOS (polycystic ovarian syndrome)   . Sleep apnea   . Thyroid disease   . Von Willebrand disease ([place]) 2012    Past Surgical History:  Procedure Laterality Date  . BREAST BIOPSY Right 2010   NEG  . CHOLECYSTECTOMY    . HALLUX VALGUS CHEILECTOMY Left 08/21/2016   Procedure: HALLUX VALGUS CHEILECTOMY;  Surgeon: Sharlotte Alamo, DPM;  Location: [facility] ORS;  Service: Podiatry;  Laterality: Left;  . HEMORRHOID SURGERY  10/2014  . SPINE SURGERY    . TONSILLECTOMY AND ADENOIDECTOMY      Social History   Socioeconomic History  . Marital status: Divorced    Spouse name: Not on file  . Number of children: Not on file  . Years of education: Not on  file  . Highest education level: Not on file  Occupational History  . Not on file  Social Needs  . Financial resource strain: Not on file  . Food insecurity:    Worry: Not on file    Inability: Not on file  . Transportation needs:    Medical: Not on file    Non-medical: Not on file  Tobacco Use  . Smoking status: Former Smoker    Years: 8.00  Types: Cigarettes    Last attempt to quit: 04/09/1994    Years since quitting: 23.8  . Smokeless tobacco: Never Used  Substance and Sexual Activity  . Alcohol use: No    Alcohol/week: 0.0 oz  . Drug use: No  . Sexual activity: Not on file  Lifestyle  . Physical activity:    Days per week: Not on file    Minutes per session: Not on file  . Stress: Not on file  Relationships  . Social connections:    Talks on phone: Not on file    Gets together: Not on file    Attends religious service: Not on file    Active member of club or organization: Not on file    Attends meetings of clubs or organizations: Not on file    Relationship status: Not on file  . Intimate partner violence:    Fear of current or ex partner: Not on file    Emotionally abused: Not on file    Physically abused: Not on file    Forced sexual activity: Not on file  Other Topics Concern  . Not on file  Social History Narrative  . Not on file     Vitals:   02/18/18 1323  BP: 128/84  Pulse: (!) 115  SpO2: 97%  Weight: 212 lb (96.2 kg)  Height: 5\' 2"  (1.575 m)    Wt Readings from Last 3 Encounters:  02/18/18 212 lb (96.2 kg)  12/20/17 224 lb 9.6 oz (101.9 kg)  08/21/16 232 lb (105.2 kg)     PHYSICAL EXAM General: NAD HEENT: Normal. Neck: No JVD, no thyromegaly. Lungs: Clear to auscultation bilaterally with normal respiratory effort. CV: Regular rate and rhythm, normal S1/S2, no S3/S4, no murmur. +chest wall tenderness. No pretibial or periankle edema.     Abdomen: Soft, nontender, no distention.  Neurologic: Alert and oriented.  Psych: Normal  affect. Skin: Normal. Musculoskeletal: No gross deformities.    ECG: Most recent ECG reviewed.   Labs: Lab Results  Component Value Date/Time   K 3.6 08/17/2016 01:59 PM   BUN 11 08/17/2016 01:59 PM   CREATININE 0.71 08/17/2016 01:59 PM   ALT 18 07/11/2015 07:55 AM   TSH 0.944 07/11/2015 07:55 AM   HGB 13.7 08/17/2016 01:59 PM   HGB 14.7 05/18/2013 11:50 AM     Lipids: Lab Results  Component Value Date/Time   LDLCALC 69 07/11/2015 07:55 AM   CHOL 143 07/11/2015 07:55 AM   TRIG 167 (H) 07/11/2015 07:55 AM   HDL 41 07/11/2015 07:55 AM       ASSESSMENT AND PLAN:  1.  Exertional dyspnea and chest pain: Nuclear stress test was normal as detailed above.  She does have several cardiovascular risk factors.  I wonder if some of her symptoms are due to elevated heart rates, tachycardia with exertion.  I will first obtain a 1 week event monitor.  I would then consider starting either low-dose metoprolol or diltiazem.  2.  Hypertension: Controlled on present therapy.  No changes.  3.  Hyperlipidemia: I reviewed lipids performed on 11/05/17 which showed total cholesterol 129, triglycerides elevated at 232, LDL 47, HDL 35.8.  Currently on Lipitor 10 mg.  4.  Type 2 diabetes mellitus: Currently on metformin.  5.  Palpitations: Palpitations occur both with rest and with exertion.  She has had heart rates as high as 130 bpm at rest recorded on her phone app I will obtain a 1 week event monitor to evaluate  for inappropriate sinus tachycardia as well as other supraventricular arrhythmias.  6.  Sleep apnea: On CPAP.     Disposition: Follow up 2 months   Kate Sable, M.D., F.A.C.C.

## 2018-03-01 ENCOUNTER — Encounter: Payer: Self-pay | Admitting: Cardiovascular Disease

## 2018-03-01 ENCOUNTER — Telehealth: Payer: Self-pay | Admitting: Cardiovascular Disease

## 2018-03-01 NOTE — Telephone Encounter (Signed)
Message sent to our [employer] rep to check into this.

## 2018-03-01 NOTE — Telephone Encounter (Signed)
Patient notified via my chart message - per [employer] rep - he spoke with the home office and they are sending out a new monitor for her.

## 2018-03-01 NOTE — Telephone Encounter (Signed)
Patient having issues with holter that was ordered for her to wear . Patient asked that if she does not answer to please leave a message on her work phone

## 2018-03-02 ENCOUNTER — Encounter (INDEPENDENT_AMBULATORY_CARE_PROVIDER_SITE_OTHER): Payer: No Typology Code available for payment source

## 2018-03-02 DIAGNOSIS — R002 Palpitations: Secondary | ICD-10-CM | POA: Diagnosis not present

## 2018-03-09 DIAGNOSIS — G609 Hereditary and idiopathic neuropathy, unspecified: Secondary | ICD-10-CM | POA: Insufficient documentation

## 2018-03-22 ENCOUNTER — Encounter: Payer: Self-pay | Admitting: Cardiovascular Disease

## 2018-03-24 ENCOUNTER — Telehealth: Payer: Self-pay | Admitting: *Deleted

## 2018-03-24 NOTE — Telephone Encounter (Signed)
Notes recorded by Laurine Blazer, LPN on 06/25/2951 at 8:41 AM EDT Patient notified via detailed voice message. Copy to pmd. Follow up scheduled for August. ------  Notes recorded by Laurine Blazer, LPN on 12/19/4399 at 02:72 AM EDT Left message to return call.  ------  Notes recorded by Herminio Commons, MD on 03/18/2018 at 12:32 PM EDT   Sinus rhythm was seen. There was one episode of sinus tachycardia, 141 bpm. Average heart rate 91 bpm.  There were no significant arrhythmias.

## 2018-03-28 ENCOUNTER — Telehealth: Payer: Self-pay | Admitting: Cardiovascular Disease

## 2018-03-28 NOTE — Telephone Encounter (Signed)
Pt aware that 2 month f/u was scheduled for 8/9 with Dr Bronson Ing - pt already aware of monitor results.

## 2018-03-28 NOTE — Telephone Encounter (Signed)
Patient called reference to monitor

## 2018-03-31 ENCOUNTER — Encounter: Payer: Self-pay | Admitting: Cardiovascular Disease

## 2018-05-23 ENCOUNTER — Ambulatory Visit: Payer: Self-pay | Admitting: Cardiovascular Disease

## 2018-05-26 ENCOUNTER — Ambulatory Visit: Payer: Self-pay | Admitting: Cardiovascular Disease

## 2018-05-27 ENCOUNTER — Ambulatory Visit: Payer: Self-pay | Admitting: Cardiovascular Disease

## 2018-05-27 ENCOUNTER — Telehealth: Payer: Self-pay | Admitting: Cardiovascular Disease

## 2018-05-27 NOTE — Telephone Encounter (Signed)
Patient would like to discuss results and if follow up is needed.   She was scheduled to see Dr Bronson Ing but missed appointment.  Stated she was unaware of the change in his schedule.

## 2018-05-27 NOTE — Telephone Encounter (Signed)
LMTCB

## 2018-05-30 NOTE — Telephone Encounter (Signed)
LMTCB

## 2018-05-31 NOTE — Telephone Encounter (Signed)
LMTCB

## 2018-06-03 NOTE — Telephone Encounter (Signed)
Have tried multiple times to reach patient. Will address issue if patient calls back.

## 2018-07-21 ENCOUNTER — Other Ambulatory Visit
Admission: RE | Admit: 2018-07-21 | Discharge: 2018-07-21 | Disposition: A | Discharge status: Home / Self Care | Payer: No Typology Code available for payment source | Source: Ambulatory Visit | Attending: Psychiatry | Admitting: Psychiatry

## 2018-07-21 DIAGNOSIS — F331 Major depressive disorder, recurrent, moderate: Secondary | ICD-10-CM | POA: Diagnosis present

## 2018-07-21 LAB — COMPREHENSIVE METABOLIC PANEL
ALT: 17 U/L (ref 0–44)
AST: 22 U/L (ref 15–41)
Albumin: 3.8 g/dL (ref 3.5–5.0)
Alkaline Phosphatase: 43 U/L (ref 38–126)
Anion gap: 10 (ref 5–15)
BUN: 20 mg/dL (ref 6–20)
CO2: 29 mmol/L (ref 22–32)
Calcium: 9.3 mg/dL (ref 8.9–10.3)
Chloride: 102 mmol/L (ref 98–111)
Creatinine, Ser: 0.73 mg/dL (ref 0.44–1.00)
GFR calc Af Amer: >60 mL/min (ref >60)
GFR calc non Af Amer: >60 mL/min (ref >60)
Glucose, Bld: 118 mg/dL — ABNORMAL HIGH (ref 70–99)
Potassium: 4.5 mmol/L (ref 3.5–5.1)
Sodium: 141 mmol/L (ref 135–145)
Total Bilirubin: 0.9 mg/dL (ref 0.3–1.2)
Total Protein: 7.1 g/dL (ref 6.5–8.1)

## 2018-07-21 LAB — CBC WITH DIFFERENTIAL/PLATELET
Basophils Absolute: 0.1 10*3/uL (ref 0–0.1)
Basophils Relative: 1 %
Eosinophils Absolute: 0.4 10*3/uL (ref 0–0.7)
Eosinophils Relative: 3 %
HCT: 41.2 % (ref 35.0–47.0)
Hemoglobin: 14.3 g/dL (ref 12.0–16.0)
Lymphocytes Relative: 31 %
Lymphs Abs: 4.3 10*3/uL — ABNORMAL HIGH (ref 1.0–3.6)
MCH: 32 pg (ref 26.0–34.0)
MCHC: 34.8 g/dL (ref 32.0–36.0)
MCV: 92 fL (ref 80.0–100.0)
Monocytes Absolute: 1 10*3/uL — ABNORMAL HIGH (ref 0.2–0.9)
Monocytes Relative: 7 %
Neutro Abs: 8.1 10*3/uL — ABNORMAL HIGH (ref 1.4–6.5)
Neutrophils Relative %: 58 %
Platelets: 366 10*3/uL (ref 150–440)
RBC: 4.48 MIL/uL (ref 3.80–5.20)
RDW: 14.6 % — ABNORMAL HIGH (ref 11.5–14.5)
WBC: 13.9 10*3/uL — ABNORMAL HIGH (ref 3.6–11.0)

## 2018-07-21 LAB — TSH: TSH: 1.137 u[IU]/mL (ref 0.350–4.500)

## 2018-07-21 LAB — FOLATE: Folate: 38 ng/mL (ref >5.9)

## 2018-07-21 LAB — VITAMIN B12: Vitamin B-12: 1068 pg/mL — ABNORMAL HIGH (ref 180–914)

## 2018-08-05 ENCOUNTER — Encounter
Admission: RE | Disposition: A | Discharge status: Home / Self Care | Payer: Self-pay | Source: Ambulatory Visit | Attending: Unknown Physician Specialty

## 2018-08-05 ENCOUNTER — Other Ambulatory Visit: Payer: Self-pay

## 2018-08-05 ENCOUNTER — Ambulatory Visit
Admission: RE | Admit: 2018-08-05 | Discharge: 2018-08-05 | Disposition: A | Discharge status: Home / Self Care | Payer: No Typology Code available for payment source | Source: Ambulatory Visit | Attending: Unknown Physician Specialty | Admitting: Unknown Physician Specialty

## 2018-08-05 ENCOUNTER — Encounter: Payer: Self-pay | Admitting: Anesthesiology

## 2018-08-05 ENCOUNTER — Ambulatory Visit: Payer: No Typology Code available for payment source | Admitting: Anesthesiology

## 2018-08-05 DIAGNOSIS — Z880 Allergy status to penicillin: Secondary | ICD-10-CM | POA: Insufficient documentation

## 2018-08-05 DIAGNOSIS — M1990 Unspecified osteoarthritis, unspecified site: Secondary | ICD-10-CM | POA: Insufficient documentation

## 2018-08-05 DIAGNOSIS — E785 Hyperlipidemia, unspecified: Secondary | ICD-10-CM | POA: Diagnosis not present

## 2018-08-05 DIAGNOSIS — Z87891 Personal history of nicotine dependence: Secondary | ICD-10-CM | POA: Diagnosis not present

## 2018-08-05 DIAGNOSIS — Z7984 Long term (current) use of oral hypoglycemic drugs: Secondary | ICD-10-CM | POA: Diagnosis not present

## 2018-08-05 DIAGNOSIS — K219 Gastro-esophageal reflux disease without esophagitis: Secondary | ICD-10-CM | POA: Diagnosis not present

## 2018-08-05 DIAGNOSIS — Z800 Family history of malignant neoplasm of digestive organs: Secondary | ICD-10-CM | POA: Insufficient documentation

## 2018-08-05 DIAGNOSIS — G4730 Sleep apnea, unspecified: Secondary | ICD-10-CM | POA: Diagnosis not present

## 2018-08-05 DIAGNOSIS — E119 Type 2 diabetes mellitus without complications: Secondary | ICD-10-CM | POA: Insufficient documentation

## 2018-08-05 DIAGNOSIS — F419 Anxiety disorder, unspecified: Secondary | ICD-10-CM | POA: Diagnosis not present

## 2018-08-05 DIAGNOSIS — F329 Major depressive disorder, single episode, unspecified: Secondary | ICD-10-CM | POA: Diagnosis not present

## 2018-08-05 DIAGNOSIS — Z91040 Latex allergy status: Secondary | ICD-10-CM | POA: Diagnosis not present

## 2018-08-05 DIAGNOSIS — K640 First degree hemorrhoids: Secondary | ICD-10-CM | POA: Diagnosis not present

## 2018-08-05 DIAGNOSIS — Z888 Allergy status to other drugs, medicaments and biological substances status: Secondary | ICD-10-CM | POA: Insufficient documentation

## 2018-08-05 DIAGNOSIS — I10 Essential (primary) hypertension: Secondary | ICD-10-CM | POA: Diagnosis not present

## 2018-08-05 DIAGNOSIS — J449 Chronic obstructive pulmonary disease, unspecified: Secondary | ICD-10-CM | POA: Insufficient documentation

## 2018-08-05 DIAGNOSIS — K5730 Diverticulosis of large intestine without perforation or abscess without bleeding: Secondary | ICD-10-CM | POA: Diagnosis not present

## 2018-08-05 DIAGNOSIS — E039 Hypothyroidism, unspecified: Secondary | ICD-10-CM | POA: Insufficient documentation

## 2018-08-05 DIAGNOSIS — Z6841 Body Mass Index (BMI) 40.0 and over, adult: Secondary | ICD-10-CM | POA: Insufficient documentation

## 2018-08-05 DIAGNOSIS — E669 Obesity, unspecified: Secondary | ICD-10-CM | POA: Insufficient documentation

## 2018-08-05 DIAGNOSIS — D680 Von Willebrand's disease: Secondary | ICD-10-CM | POA: Insufficient documentation

## 2018-08-05 DIAGNOSIS — Z1211 Encounter for screening for malignant neoplasm of colon: Secondary | ICD-10-CM | POA: Diagnosis not present

## 2018-08-05 DIAGNOSIS — Z85820 Personal history of malignant melanoma of skin: Secondary | ICD-10-CM | POA: Insufficient documentation

## 2018-08-05 DIAGNOSIS — Z79899 Other long term (current) drug therapy: Secondary | ICD-10-CM | POA: Insufficient documentation

## 2018-08-05 DIAGNOSIS — E282 Polycystic ovarian syndrome: Secondary | ICD-10-CM | POA: Diagnosis not present

## 2018-08-05 HISTORY — DX: Benign neoplasm of pituitary gland: D35.2

## 2018-08-05 HISTORY — DX: Unspecified osteoarthritis, unspecified site: M19.90

## 2018-08-05 HISTORY — DX: Lymphocytosis (symptomatic): D72.820

## 2018-08-05 HISTORY — DX: Pneumonia, unspecified organism: J18.9

## 2018-08-05 HISTORY — DX: Chronic obstructive pulmonary disease, unspecified: J44.9

## 2018-08-05 HISTORY — DX: Personal history of other specified conditions: Z87.898

## 2018-08-05 HISTORY — DX: Unspecified asthma, uncomplicated: J45.909

## 2018-08-05 HISTORY — DX: Other disorders of pituitary gland: E23.6

## 2018-08-05 HISTORY — DX: Cardiac arrhythmia, unspecified: I49.9

## 2018-08-05 HISTORY — PX: COLONOSCOPY WITH PROPOFOL: SHX5780

## 2018-08-05 LAB — GLUCOSE, CAPILLARY: Glucose-Capillary: 101 mg/dL — ABNORMAL HIGH (ref 70–99)

## 2018-08-05 SURGERY — COLONOSCOPY WITH PROPOFOL
Anesthesia: General

## 2018-08-05 MED ORDER — SODIUM CHLORIDE 0.9 % IV SOLN
INTRAVENOUS | Status: DC
  Administered 2018-08-05: 14:00:00 via INTRAVENOUS

## 2018-08-05 MED ORDER — PROPOFOL 10 MG/ML IV BOLUS
INTRAVENOUS | Status: DC | PRN
  Administered 2018-08-05: 50 mg via INTRAVENOUS

## 2018-08-05 MED ORDER — FENTANYL CITRATE (PF) 100 MCG/2ML IJ SOLN
INTRAMUSCULAR | Status: DC | PRN
  Administered 2018-08-05 (×2): 50 ug via INTRAVENOUS

## 2018-08-05 MED ORDER — MIDAZOLAM HCL 5 MG/5ML IJ SOLN
INTRAMUSCULAR | Status: DC | PRN
  Administered 2018-08-05 (×2): 2 mg via INTRAVENOUS

## 2018-08-05 MED ORDER — SODIUM CHLORIDE 0.9 % IV SOLN: INTRAVENOUS | Status: DC

## 2018-08-05 MED ORDER — FENTANYL CITRATE (PF) 100 MCG/2ML IJ SOLN
INTRAMUSCULAR | Status: AC
  Filled 2018-08-05: qty 2

## 2018-08-05 MED ORDER — LIDOCAINE HCL (PF) 2 % IJ SOLN
INTRAMUSCULAR | Status: AC
  Filled 2018-08-05: qty 10

## 2018-08-05 MED ORDER — MIDAZOLAM HCL 2 MG/2ML IJ SOLN
INTRAMUSCULAR | Status: AC
  Filled 2018-08-05: qty 2

## 2018-08-05 MED ORDER — PROPOFOL 500 MG/50ML IV EMUL
INTRAVENOUS | Status: AC
  Filled 2018-08-05: qty 50

## 2018-08-05 MED ORDER — LIDOCAINE HCL (PF) 2 % IJ SOLN
INTRAMUSCULAR | Status: DC | PRN
  Administered 2018-08-05: 100 mg

## 2018-08-05 MED ORDER — PROPOFOL 10 MG/ML IV BOLUS
INTRAVENOUS | Status: AC
  Filled 2018-08-05: qty 20

## 2018-08-05 MED ORDER — PROPOFOL 500 MG/50ML IV EMUL
INTRAVENOUS | Status: DC | PRN
  Administered 2018-08-05: 50 ug/kg/min via INTRAVENOUS

## 2018-08-05 NOTE — Anesthesia Postprocedure Evaluation (Signed)
Anesthesia Post Note  Patient: Heather Burnett  Procedure(s) Performed: COLONOSCOPY WITH PROPOFOL (N/A )  Patient location during evaluation: Endoscopy Anesthesia Type: General Level of consciousness: awake and alert Pain management: pain level controlled Vital Signs Assessment: post-procedure vital signs reviewed and stable Respiratory status: spontaneous breathing, nonlabored ventilation, respiratory function stable and patient connected to nasal cannula oxygen Cardiovascular status: blood pressure returned to baseline and stable Postop Assessment: no apparent nausea or vomiting Anesthetic complications: no     Last Vitals:  Vitals:   08/05/18 1307 08/05/18 1437  BP: 122/71 (!) 105/56  Pulse: 92 98  Resp: 18 16  Temp: 36.7 C (!) 35.7 C  SpO2: 100%     Last Pain:  Vitals:   08/05/18 1457  TempSrc:   PainSc: 0-No pain                 Eusebia Grulke S

## 2018-08-05 NOTE — Op Note (Signed)
[facility] Gastroenterology Patient Name: Heather Burnett Procedure Date: 08/05/2018 1:39 PM MRN: [id number] Account #: [id number] Date of Birth: September 16, 1971 Admit Type: Outpatient Age: 47 Room: [facility] ENDO ROOM 1 Gender: Female Note Status: Finalized Procedure:            Colonoscopy Indications:          Screening in patient at increased risk: Family history                        of 1st-degree relative with colorectal cancer Providers:            Manya Silvas, MD Referring MD:         Ramonita Lab, MD (Referring MD) Medicines:            Propofol per Anesthesia Complications:        No immediate complications. Procedure:            Pre-Anesthesia Assessment:                       - After reviewing the risks and benefits, the patient                        was deemed in satisfactory condition to undergo the                        procedure.                       After obtaining informed consent, the colonoscope was                        passed under direct vision. Throughout the procedure,                        the patient's blood pressure, pulse, and oxygen                        saturations were monitored continuously. The                        Colonoscope was introduced through the anus and                        advanced to the the cecum, identified by appendiceal                        orifice and ileocecal valve. The colonoscopy was                        performed without difficulty. The patient tolerated the                        procedure well. The quality of the bowel preparation                        was good. Findings:      Many small-mouthed diverticula were found in the sigmoid colon,       descending colon and transverse colon.      Internal hemorrhoids were found during endoscopy. The hemorrhoids were       small and Grade I (internal  hemorrhoids that do not prolapse).      The exam was otherwise without abnormality. Impression:            - Diverticulosis in the sigmoid colon, in the                        descending colon and in the transverse colon.                       - Internal hemorrhoids.                       - The examination was otherwise normal.                       - No specimens collected. Recommendation:       - Repeat colonoscopy in 5 years for screening purposes. Manya Silvas, MD 08/05/2018 2:36:00 PM This report has been signed electronically. Number of Addenda: 0 Note Initiated On: 08/05/2018 1:39 PM Scope Withdrawal Time: 0 hours 6 minutes 23 seconds  Total Procedure Duration: 0 hours 10 minutes 22 seconds       [facility]

## 2018-08-05 NOTE — Anesthesia Post-op Follow-up Note (Signed)
Anesthesia QCDR form completed.        

## 2018-08-05 NOTE — Anesthesia Preprocedure Evaluation (Signed)
Anesthesia Evaluation  Patient identified by MRN, date of birth, ID band Patient awake    Reviewed: Allergy & Precautions, NPO status , Patient's Chart, lab work & pertinent test results, reviewed documented beta blocker date and time   History of Anesthesia Complications (+) Family history of anesthesia reaction  Airway Mallampati: III  TM Distance: >3 FB     Dental  (+) Chipped   Pulmonary asthma , sleep apnea , pneumonia, resolved, COPD, former smoker,           Cardiovascular hypertension, Pt. on medications and Pt. on home beta blockers + dysrhythmias      Neuro/Psych  Headaches, PSYCHIATRIC DISORDERS Anxiety Depression    GI/Hepatic GERD  ,  Endo/Other  diabetes, Type 2Hypothyroidism   Renal/GU      Musculoskeletal  (+) Arthritis ,   Abdominal   Peds  Hematology   Anesthesia Other Findings Obese. PCOS. Von Willebrands. EF 60-65.  Reproductive/Obstetrics                             Anesthesia Physical Anesthesia Plan  ASA: III  Anesthesia Plan: General   Post-op Pain Management:    Induction: Intravenous  PONV Risk Score and Plan:   Airway Management Planned:   Additional Equipment:   Intra-op Plan:   Post-operative Plan:   Informed Consent: I have reviewed the patients History and Physical, chart, labs and discussed the procedure including the risks, benefits and alternatives for the proposed anesthesia with the patient or authorized representative who has indicated his/her understanding and acceptance.     Plan Discussed with: CRNA  Anesthesia Plan Comments:         Anesthesia Quick Evaluation

## 2018-08-05 NOTE — Transfer of Care (Signed)
Immediate Anesthesia Transfer of Care Note  Patient: CORISA MONTINI  Procedure(s) Performed: COLONOSCOPY WITH PROPOFOL (N/A )  Patient Location: PACU  Anesthesia Type:General  Level of Consciousness: sedated  Airway & Oxygen Therapy: Patient Spontanous Breathing and Patient connected to nasal cannula oxygen  Post-op Assessment: Report given to RN and Post -op Vital signs reviewed and stable  Post vital signs: Reviewed and stable  Last Vitals:  Vitals Value Taken Time  BP 100/56 08/05/2018  2:36 PM  Temp    Pulse 94 08/05/2018  2:36 PM  Resp 16 08/05/2018  2:36 PM  SpO2 95 % 08/05/2018  2:36 PM  Vitals shown include unvalidated device data.  Last Pain:  Vitals:   08/05/18 1307  TempSrc: Tympanic         Complications: No apparent anesthesia complications

## 2018-08-05 NOTE — H&P (Signed)
Primary Care Physician:  Adin Hector, MD Primary Gastroenterologist:  Dr. Vira Agar  Pre-Procedure History & Physical: HPI:  Heather Burnett is a 47 y.o. female is here for an colonoscopy.  Family history of colon cancer in brother.   Past Medical History:  Diagnosis Date  . Allergy   . Anxiety   . Asthma   . Cancer ([place])    melanoma on my chest  . COPD (chronic obstructive pulmonary disease) ([place])   . DDD (degenerative disc disease), cervical    and lower back  . Depression   . Diabetes mellitus without complication ([place])    type 2  . DJD (degenerative joint disease)   . Dysrhythmia   . Empty sella syndrome ([place])    followed at [facility]  . Family history of adverse reaction to anesthesia    mother almost died with anesthesia in the past, hard to wake up and got real sick on my stomach vomiting, now she receives local anethesia.  Marland Kitchen GERD (gastroesophageal reflux disease)   . Headache    history of migraine  . History of palpitations 07/2004  . Hyperlipidemia   . Hypertension   . Hypothyroidism   . Lymphocytosis   . Neuropathy 2016  . PCOS (polycystic ovarian syndrome)   . Pituitary microadenoma ([place])    followed at [facility]  . Pneumonia 2008  . Sleep apnea   . Thyroid disease   . Von Willebrand disease ([place]) 2012    Past Surgical History:  Procedure Laterality Date  . BACK SURGERY     removal of 2 lumbar vertebrae  . BREAST BIOPSY Right 2010   NEG  . CHOLECYSTECTOMY    . COLONOSCOPY  03/03/2013, 02/06/2008  . EGD  02/06/2008  . HALLUX VALGUS CHEILECTOMY Left 08/21/2016   Procedure: HALLUX VALGUS CHEILECTOMY;  Surgeon: Sharlotte Alamo, DPM;  Location: [facility] ORS;  Service: Podiatry;  Laterality: Left;  . HEMORRHOID SURGERY  10/2014  . SPINE SURGERY    . TONSILLECTOMY AND ADENOIDECTOMY      Prior to Admission medications   Medication Sig Start Date End Date Taking? Authorizing Provider  albuterol (VENTOLIN HFA) 108 (90 Base) MCG/ACT inhaler Inhale 2 puffs into the lungs  every 6 (six) hours as needed for wheezing or shortness of breath.   Yes [provider]  atorvastatin (LIPITOR) 10 MG tablet Take 1 tablet by mouth daily at 6 PM. Taking 11/29/14 08/05/18 Yes [provider]  Biotin w/ Vitamins C & E (HAIR SKIN & NAILS GUMMIES PO) Take 1 capsule by mouth daily.   Yes [provider]  celecoxib (CELEBREX) 200 MG capsule Take 1 capsule by mouth 2 (two) times daily. 12/07/17  Yes [provider]  cetirizine (ZYRTEC) 10 MG tablet Take 1 tablet by mouth daily as needed.    Yes [provider]  chlorpheniramine (CHLOR-TRIMETON) 4 MG tablet Take 4 mg by mouth 2 (two) times daily as needed for allergies.   Yes [provider]  clindamycin (CLEOCIN T) 1 % SWAB Apply 1 applicator topically 2 (two) times daily as needed. 09/18/14  Yes [provider]  doxycycline (VIBRA-TABS) 100 MG tablet Take 100 mg by mouth as directed.   Yes [provider]  DULoxetine (CYMBALTA) 60 MG capsule Take 60 mg by mouth daily.   Yes [provider]  Fluticasone-Salmeterol (ADVAIR DISKUS) 250-50 MCG/DOSE AEPB Inhale 1 puff into the lungs every 12 (twelve) hours as needed.   Yes [provider]  furosemide (LASIX) 40 MG tablet Take 1 tablet by mouth daily as needed. 04/12/17  Yes [provider]  levothyroxine (SYNTHROID, LEVOTHROID) 25 MCG tablet Take 1 tablet by mouth at bedtime.  05/31/14  Yes [provider]  loratadine (CLARITIN) 10 MG tablet Take 10 mg by mouth daily.   Yes [provider]  magnesium oxide (MAG-OX) 400 MG tablet Take 400 mg by mouth daily.   Yes [provider]  metFORMIN (GLUCOPHAGE) 1000 MG tablet Take 1,000 mg by mouth 2 (two) times daily with a meal.  11/29/14  Yes [provider]  Methylcobalamin (METHYL B-12 PO) Take 1 tablet by mouth daily.   Yes [provider]  metoprolol succinate (TOPROL-XL) 50 MG 24 hr tablet Take 1 tablet by  mouth at bedtime. Check BP prior to taking Metoprolol, if low hold the dose.  If SBP is elevated over 140 or DBP is above 90, pt and take 0.5 tablet. 10/24/14  Yes [provider]  mometasone (NASONEX) 50 MCG/ACT nasal spray Place 2 sprays into the nose daily.   Yes [provider]  sertraline (ZOLOFT) 100 MG tablet Take 100 mg by mouth daily.   Yes [provider]  spironolactone (ALDACTONE) 25 MG tablet Take 1 tablet by mouth 2 (two) times daily. 11/29/14  Yes [provider]  busPIRone (BUSPAR) 15 MG tablet Take 0.5 tablets by mouth 2 (two) times daily. 08/09/17   [provider]  Butenafine HCl (MENTAX EX) Take by mouth.    [provider]  Homeopathic Products (LEG CRAMP COMPLEX PO) Take 1 tablet by mouth daily as needed.    [provider]  l-methylfolate-B6-B12 (METANX) 3-35-2 MG TABS tablet Take 1 tablet by mouth 2 (two) times daily.    [provider]  Potassium 99 MG TABS Take 1 tablet by mouth daily.    [provider]    Allergies as of 05/30/2018 - Review Complete 02/18/2018  Allergen Reaction Noted  . Liraglutide -weight management  04/10/2015  . Sorbitan Other (See Comments) 04/10/2015  . Adhesive [tape] Itching and Rash 08/17/2016  . Latex Rash 04/10/2015  . Penicillins Rash 04/10/2015    Family History  Problem Relation Age of Onset  . Diabetes Father   . Hyperlipidemia Father   . Hypertension Father   . Cancer Father   . Breast cancer Sister 47  . Breast cancer Maternal Aunt 45  . Breast cancer Maternal Grandmother 78    Social History   Socioeconomic History  . Marital status: Divorced    Spouse name: Not on file  . Number of children: Not on file  . Years of education: Not on file  . Highest education level: Not on file  Occupational History  . Not on file  Social Needs  . Financial resource strain: Not on file  . Food insecurity:    Worry: Not on file    Inability: Not on  file  . Transportation needs:    Medical: Not on file    Non-medical: Not on file  Tobacco Use  . Smoking status: Former Smoker    Years: 8.00    Types: Cigarettes    Last attempt to quit: 04/09/1994    Years since quitting: 24.3  . Smokeless tobacco: Never Used  Substance and Sexual Activity  . Alcohol use: No    Alcohol/week: 0.0 standard drinks  . Drug use: No  . Sexual activity: Not on file  Lifestyle  . Physical activity:  Days per week: Not on file    Minutes per session: Not on file  . Stress: Not on file  Relationships  . Social connections:    Talks on phone: Not on file    Gets together: Not on file    Attends religious service: Not on file    Active member of club or organization: Not on file    Attends meetings of clubs or organizations: Not on file    Relationship status: Not on file  . Intimate partner violence:    Fear of current or ex partner: Not on file    Emotionally abused: Not on file    Physically abused: Not on file    Forced sexual activity: Not on file  Other Topics Concern  . Not on file  Social History Narrative  . Not on file    Review of Systems: See HPI, otherwise negative ROS  Physical Exam: BP 122/71   Pulse 92   Temp 98.1 F (36.7 C) (Tympanic)   Resp 18   Ht 5\' 2"  (1.575 m)   Wt 104.3 kg   LMP 07/27/2018 Comment: Upreg neg  SpO2 100%   BMI 42.07 kg/m  General:   Alert,  pleasant and cooperative in NAD Head:  Normocephalic and atraumatic. Neck:  Supple; no masses or thyromegaly. Lungs:  Clear throughout to auscultation.    Heart:  Regular rate and rhythm. Abdomen:  Soft, nontender and nondistended. Normal bowel sounds, without guarding, and without rebound.   Neurologic:  Alert and  oriented x4;  grossly normal neurologically.  Impression/Plan: Heather Burnett is here for an colonoscopy to be performed for family history of colon cancer.  Risks, benefits, limitations, and alternatives regarding  colonoscopy have been  reviewed with the patient.  Questions have been answered.  All parties agreeable.   Gaylyn Cheers, MD  08/05/2018, 2:17 PM

## 2018-08-08 ENCOUNTER — Encounter: Payer: Self-pay | Admitting: Unknown Physician Specialty

## 2018-08-10 LAB — POCT PREGNANCY, URINE: Preg Test, Ur: NEGATIVE

## 2018-08-24 IMAGING — MG MM DIGITAL SCREENING BILAT W/ TOMO W/ CAD
8 of 12 series · 8 of 28 positions shown · non-contrast
Comparison: Previous exam(s).

CLINICAL DATA: Screening.

EXAM:
2D DIGITAL SCREENING BILATERAL MAMMOGRAM WITH CAD AND ADJUNCT TOMO

[R MLO]
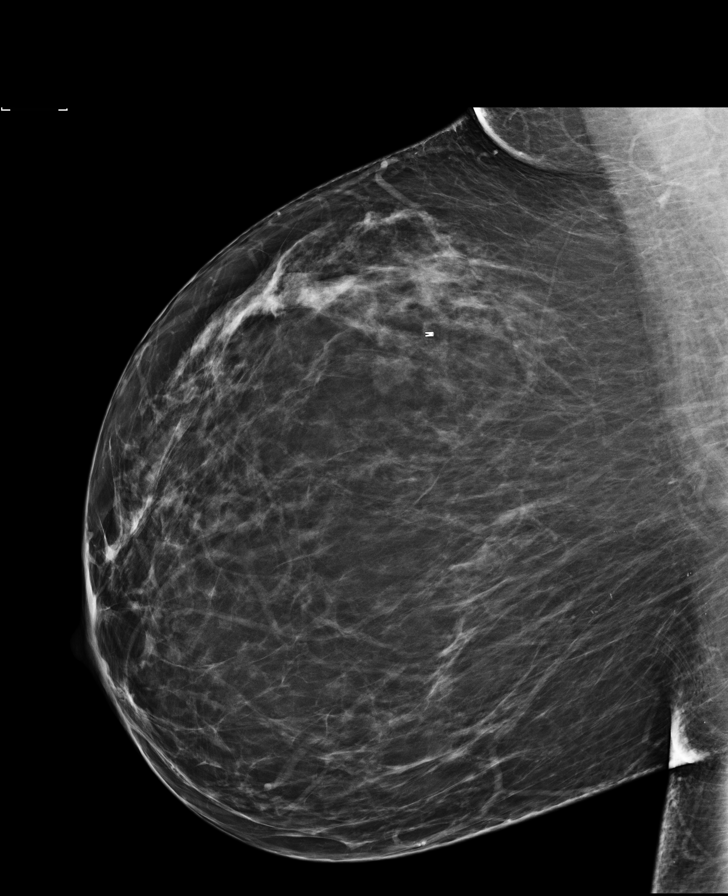

[L MLO synth-2D]
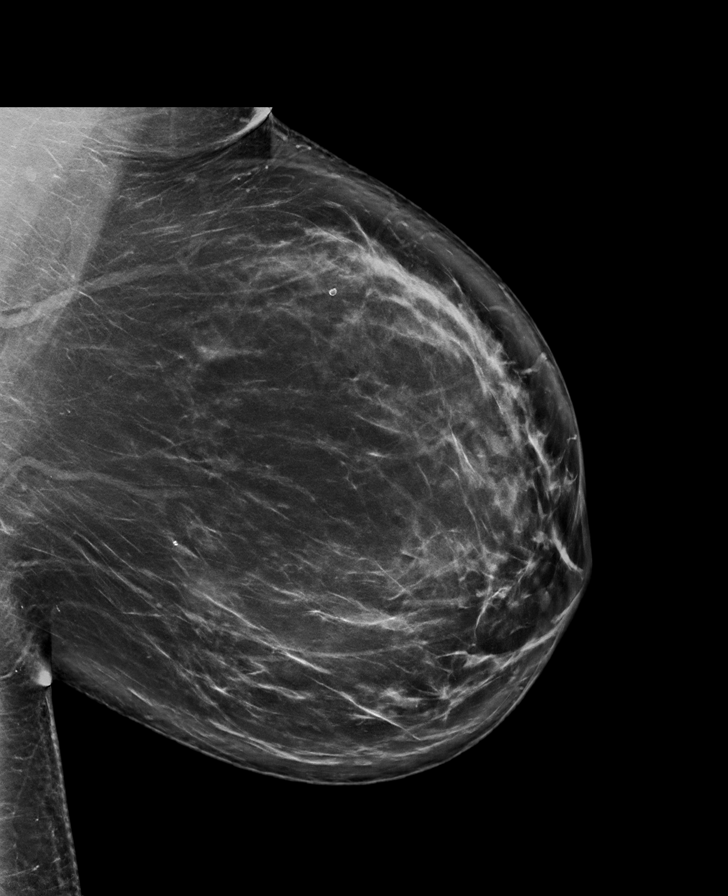

[L CC synth-2D]
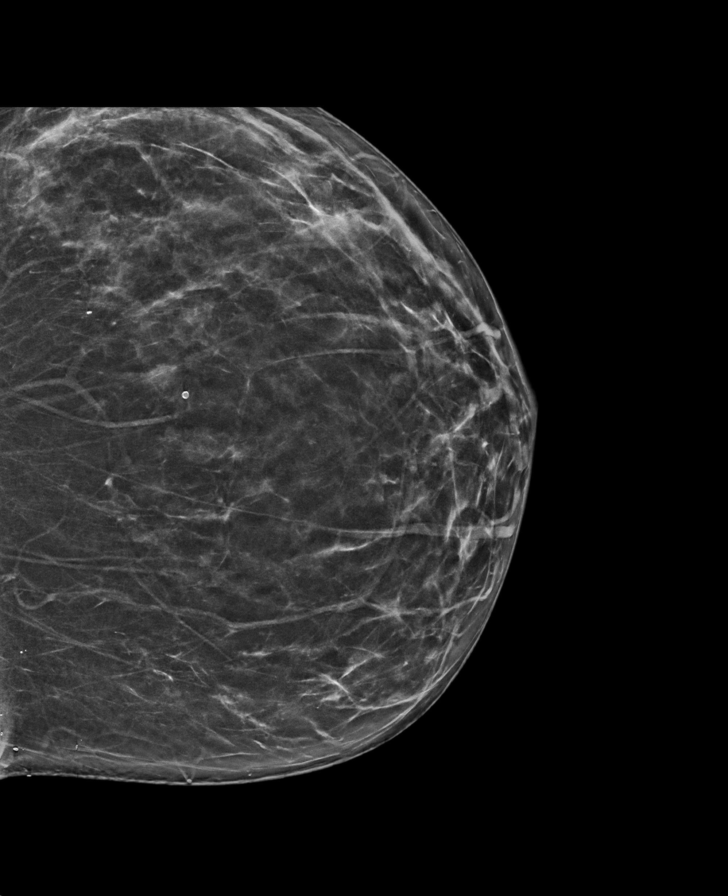

[L CC]
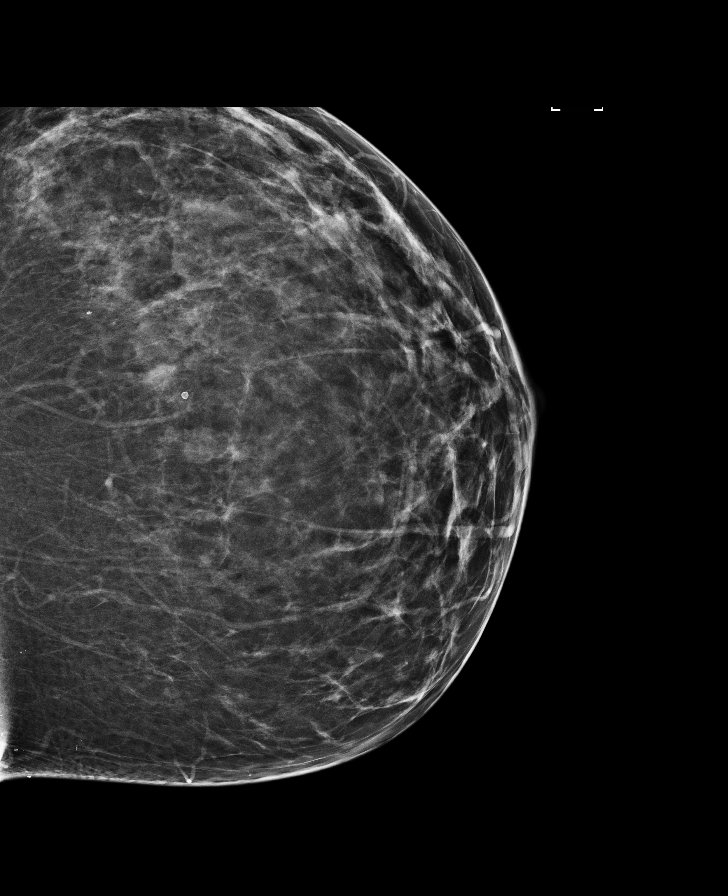

[R MLO synth-2D]
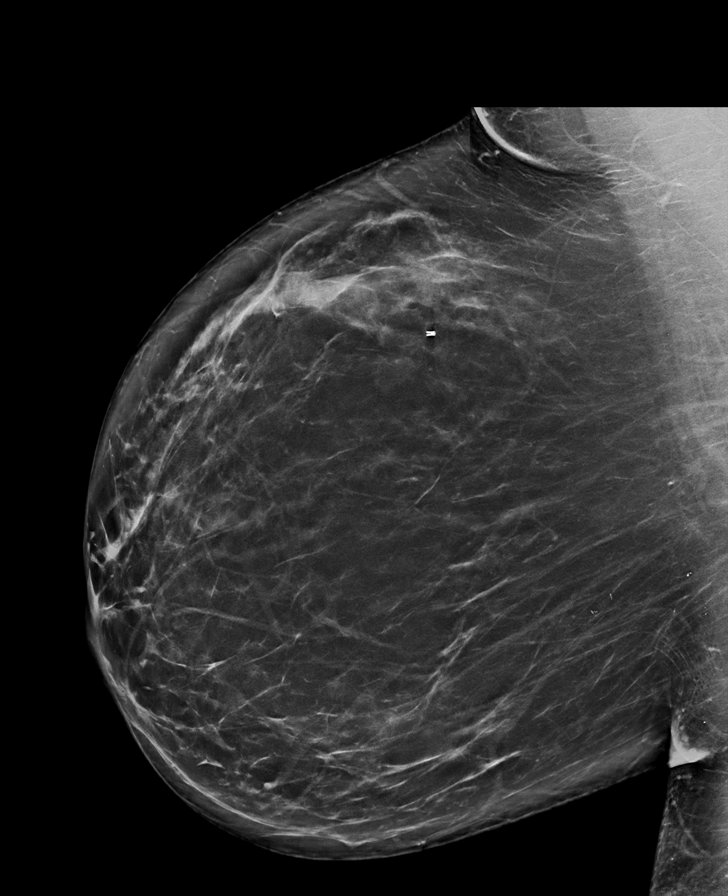

[R CC]
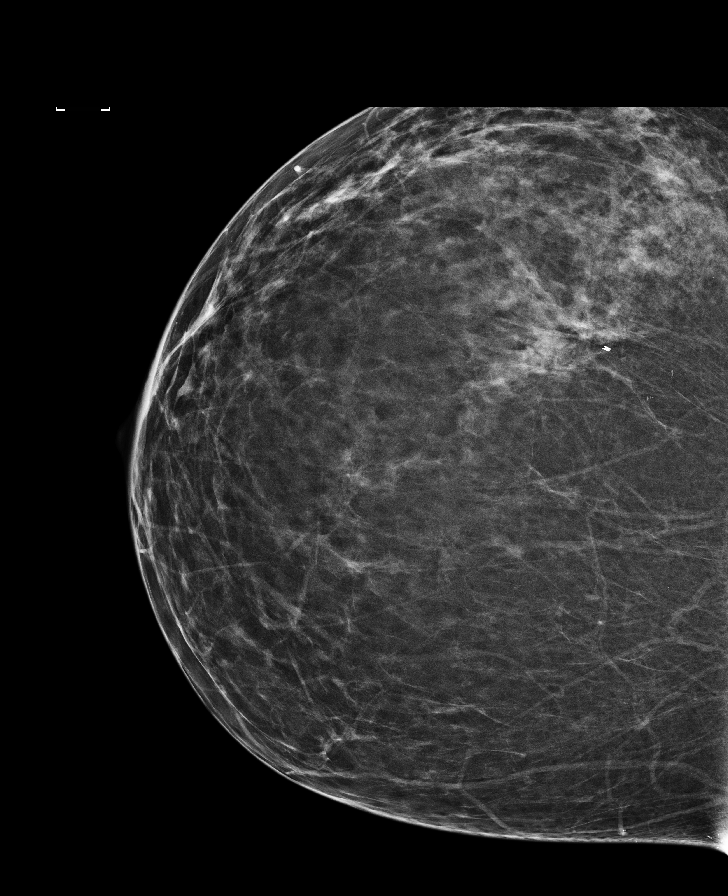

[R CC synth-2D]
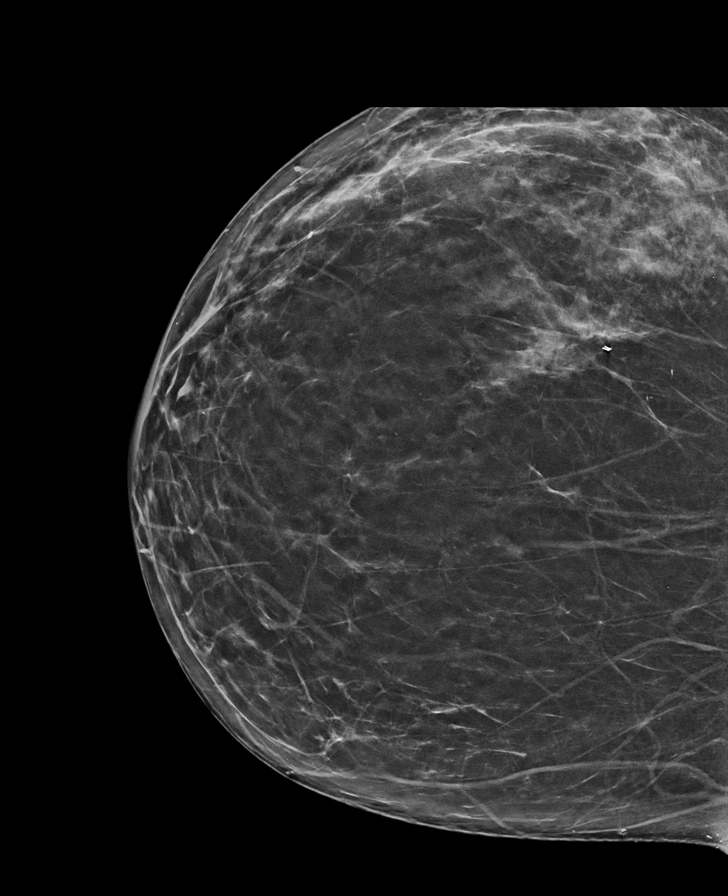

[L MLO]
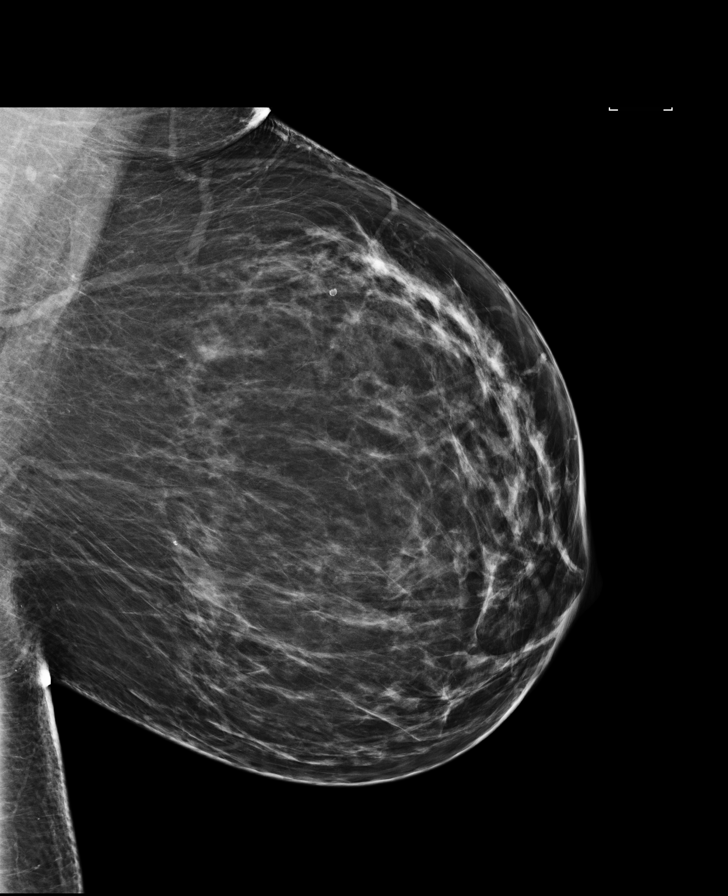

[8 of 28 positions shown; findings below may reference images not displayed]

ACR Breast Density Category b: There are scattered areas of
fibroglandular density.
FINDINGS: There are no findings suspicious for malignancy. Images were
processed with CAD.
IMPRESSION: No mammographic evidence of malignancy. A result letter of this
screening mammogram will be mailed directly to the patient.

RECOMMENDATION:
Screening mammogram in one year. (Code:97-6-RS4)

BI-RADS CATEGORY  1: Negative.

## 2018-10-07 ENCOUNTER — Encounter: Payer: Self-pay | Admitting: Cardiovascular Disease

## 2018-10-07 ENCOUNTER — Ambulatory Visit (INDEPENDENT_AMBULATORY_CARE_PROVIDER_SITE_OTHER): Payer: No Typology Code available for payment source | Admitting: Cardiovascular Disease

## 2018-10-07 VITALS — BP 110/76 | HR 97 | Ht 62.0 in | Wt 230.0 lb

## 2018-10-07 DIAGNOSIS — E785 Hyperlipidemia, unspecified: Secondary | ICD-10-CM

## 2018-10-07 DIAGNOSIS — R079 Chest pain, unspecified: Secondary | ICD-10-CM

## 2018-10-07 DIAGNOSIS — R0602 Shortness of breath: Secondary | ICD-10-CM

## 2018-10-07 DIAGNOSIS — G4733 Obstructive sleep apnea (adult) (pediatric): Secondary | ICD-10-CM

## 2018-10-07 DIAGNOSIS — R002 Palpitations: Secondary | ICD-10-CM | POA: Diagnosis not present

## 2018-10-07 DIAGNOSIS — Z9989 Dependence on other enabling machines and devices: Secondary | ICD-10-CM

## 2018-10-07 DIAGNOSIS — I10 Essential (primary) hypertension: Secondary | ICD-10-CM | POA: Diagnosis not present

## 2018-10-07 DIAGNOSIS — E119 Type 2 diabetes mellitus without complications: Secondary | ICD-10-CM

## 2018-10-07 NOTE — Progress Notes (Signed)
SUBJECTIVE: The patient presents for follow-up of palpitations.  Event monitoring demonstrated predominantly sinus rhythm with one episode of sinus tachycardia, 141 bpm.  Average heart rate was 91 bpm.  There were no significant arrhythmias.  Nuclear stress test was normal on 01/07/2018, EF 71%.  Echocardiogram demonstrated normal left ventricular systolic function, LVEF 60 to 65%.  Diastolic function could not be assessed.  Wall thickness was normal.  She has put on 18 pound since her last visit with me.  She told me her father passed away about 2 days after his visit with Dr. Harl Bowie earlier this year.  She has had a lot of stress and anxiety due to this as well as work-related stress.  She continues to experience chest pain and tightness associate with shortness of breath and fatigue.  She has symptoms related to acid reflux which she says are different than this.    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 She lives with her aging parents.  She is divorced.  She has a daughter who is a Engineer, building services in [place], [place].  Review of Systems: As per "subjective", otherwise negative.  Allergies  Allergen Reactions  . Liraglutide -Weight Management     Other reaction(s): Other (See Comments) GI upset  . Sorbitan Other (See Comments)  . Adhesive [Tape] Itching and Rash    Paper tape or coban is easier on the skin.  . Latex Rash  . Penicillins Rash    Current Outpatient Medications  Medication Sig Dispense Refill  . albuterol (VENTOLIN HFA) 108 (90 Base) MCG/ACT inhaler Inhale 2 puffs into the lungs every 6 (six) hours as needed for wheezing or shortness of breath.    Marland Kitchen atorvastatin (LIPITOR) 10 MG tablet Take 1 tablet by mouth daily at 6 PM. Taking    . cetirizine (ZYRTEC) 10 MG tablet Take 1 tablet by mouth daily as needed.     . chlorpheniramine (CHLOR-TRIMETON) 4 MG tablet Take 4 mg by mouth 2 (two) times  daily as needed for allergies.    . citalopram (CELEXA) 40 MG tablet Take 40 mg by mouth daily.    . clindamycin (CLEOCIN T) 1 % SWAB Apply 1 applicator topically 2 (two) times daily as needed.    . doxycycline (VIBRA-TABS) 100 MG tablet Take 100 mg by mouth as directed.    . DULoxetine (CYMBALTA) 60 MG capsule Take 60 mg by mouth daily.    . Fluticasone-Salmeterol (ADVAIR DISKUS) 250-50 MCG/DOSE AEPB Inhale 1 puff into the lungs every 12 (twelve) hours as needed.    . furosemide (LASIX) 40 MG tablet Take 1 tablet by mouth daily as needed.    Marland Kitchen levothyroxine (SYNTHROID, LEVOTHROID) 25 MCG tablet Take 1 tablet by mouth at bedtime.     Marland Kitchen loratadine (CLARITIN) 10 MG tablet Take 10 mg by mouth daily.    . magnesium oxide (MAG-OX) 400 MG tablet Take 400 mg by mouth daily.    . metFORMIN (GLUCOPHAGE) 1000 MG tablet Take 1,000 mg by mouth 2 (two) times daily with a meal.     . Methylcobalamin (METHYL B-12 PO) Take 1 tablet by mouth daily.    . metoprolol succinate (TOPROL-XL) 50 MG 24 hr tablet Take 1 tablet by mouth at bedtime. Check BP prior to taking Metoprolol, if low hold the dose.  If SBP is elevated over 140 or DBP is above 90, pt and take 0.5 tablet.    Marland Kitchen  mometasone (NASONEX) 50 MCG/ACT nasal spray Place 2 sprays into the nose daily.    . Potassium 99 MG TABS Take 1 tablet by mouth daily.    Marland Kitchen spironolactone (ALDACTONE) 25 MG tablet Take 1 tablet by mouth 2 (two) times daily.     No current facility-administered medications for this visit.     Past Medical History:  Diagnosis Date  . Allergy   . Anxiety   . Asthma   . Cancer ([place])    melanoma on my chest  . COPD (chronic obstructive pulmonary disease) ([place])   . DDD (degenerative disc disease), cervical    and lower back  . Depression   . Diabetes mellitus without complication ([place])    type 2  . DJD (degenerative joint disease)   . Dysrhythmia   . Empty sella syndrome ([place])    followed at [facility]  . Family history of adverse  reaction to anesthesia    mother almost died with anesthesia in the past, hard to wake up and got real sick on my stomach vomiting, now she receives local anethesia.  Marland Kitchen GERD (gastroesophageal reflux disease)   . Headache    history of migraine  . History of palpitations 07/2004  . Hyperlipidemia   . Hypertension   . Hypothyroidism   . Lymphocytosis   . Neuropathy 2016  . PCOS (polycystic ovarian syndrome)   . Pituitary microadenoma ([place])    followed at [facility]  . Pneumonia 2008  . Sleep apnea   . Thyroid disease   . Von Willebrand disease ([place]) 2012    Past Surgical History:  Procedure Laterality Date  . BACK SURGERY     removal of 2 lumbar vertebrae  . BREAST BIOPSY Right 2010   NEG  . CHOLECYSTECTOMY    . COLONOSCOPY  03/03/2013, 02/06/2008  . COLONOSCOPY WITH PROPOFOL N/A 08/05/2018   Procedure: COLONOSCOPY WITH PROPOFOL;  Surgeon: Manya Silvas, MD;  [facility] ENDOSCOPY;  Service: Endoscopy;  Laterality: N/A;  . EGD  02/06/2008  . HALLUX VALGUS CHEILECTOMY Left 08/21/2016   Procedure: HALLUX VALGUS CHEILECTOMY;  Surgeon: Sharlotte Alamo, DPM;  Location: [facility] ORS;  Service: Podiatry;  Laterality: Left;  . HEMORRHOID SURGERY  10/2014  . SPINE SURGERY    . TONSILLECTOMY AND ADENOIDECTOMY      Social History   Socioeconomic History  . Marital status: Divorced    Spouse name: Not on file  . Number of children: Not on file  . Years of education: Not on file  . Highest education level: Not on file  Occupational History  . Not on file  Social Needs  . Financial resource strain: Not on file  . Food insecurity:    Worry: Not on file    Inability: Not on file  . Transportation needs:    Medical: Not on file    Non-medical: Not on file  Tobacco Use  . Smoking status: Former Smoker    Years: 8.00    Types: Cigarettes    Last attempt to quit: 04/09/1994    Years since quitting: 24.5  . Smokeless tobacco: Never Used  Substance and Sexual Activity  . Alcohol use: No     Alcohol/week: 0.0 standard drinks  . Drug use: No  . Sexual activity: Not on file  Lifestyle  . Physical activity:    Days per week: Not on file    Minutes per session: Not on file  . Stress: Not on file  Relationships  . Social connections:  Talks on phone: Not on file    Gets together: Not on file    Attends religious service: Not on file    Active member of club or organization: Not on file    Attends meetings of clubs or organizations: Not on file    Relationship status: Not on file  . Intimate partner violence:    Fear of current or ex partner: Not on file    Emotionally abused: Not on file    Physically abused: Not on file    Forced sexual activity: Not on file  Other Topics Concern  . Not on file  Social History Narrative  . Not on file     Vitals:   10/07/18 1341  BP: 110/76  Pulse: 97  SpO2: 97%  Weight: 230 lb (104.3 kg)  Height: 5\' 2"  (1.575 m)    Wt Readings from Last 3 Encounters:  10/07/18 230 lb (104.3 kg)  08/05/18 230 lb (104.3 kg)  02/18/18 212 lb (96.2 kg)     PHYSICAL EXAM General: NAD HEENT: Normal. Neck: No JVD, no thyromegaly. Lungs: Clear to auscultation bilaterally with normal respiratory effort. CV: Regular rate and rhythm, normal S1/S2, no S3/S4, no murmur. No pretibial or periankle edema.  No carotid bruit.   Abdomen: Soft, nontender, obese, no distention.  Neurologic: Alert and oriented.  Psych: Normal affect. Skin: Normal. Musculoskeletal: No gross deformities.    ECG: Reviewed above under Subjective   Labs: Lab Results  Component Value Date/Time   K 4.5 07/21/2018 07:45 AM   BUN 20 07/21/2018 07:45 AM   CREATININE 0.73 07/21/2018 07:45 AM   ALT 17 07/21/2018 07:45 AM   TSH 1.137 07/21/2018 07:45 AM   HGB 14.3 07/21/2018 07:45 AM   HGB 14.7 05/18/2013 11:50 AM     Lipids: Lab Results  Component Value Date/Time   LDLCALC 69 07/11/2015 07:55 AM   CHOL 143 07/11/2015 07:55 AM   TRIG 167 (H) 07/11/2015 07:55 AM     HDL 41 07/11/2015 07:55 AM       ASSESSMENT AND PLAN:  1.  Chest pain and shortness of breath: Currently on Toprol-XL 50 mg daily.  She underwent a normal nuclear stress test in March 2019 but continues to experience symptoms.  I am not certain if this is due to occult ischemic heart disease versus symptoms related to anxiety, depression, and possibly panic disorder.  I will obtain a coronary CT angiogram.  2.  Hypertension: Blood pressure is normal.  No changes to therapy.  3.  Hyperlipidemia: Lipids previously reviewed.  Continue atorvastatin 10 mg.  4.  Type 2 diabetes mellitus: Currently on metformin.  5.  Sleep apnea: On CPAP.  6.  Palpitations: Event monitor reviewed above without significant arrhythmias.  She is currently on Toprol-XL 50 mg daily.   Disposition: Follow up 3 months   Kate Sable, M.D., F.A.C.C.

## 2018-10-07 NOTE — Patient Instructions (Signed)
Medication Instructions:  Your physician recommends that you continue on your current medications as directed. Please refer to the Current Medication list given to you today.   Labwork: none  Testing/Procedures: Coronary CT   Follow-Up: Your physician recommends that you schedule a follow-up appointment in: 2-3 months     Any Other Special Instructions Will Be Listed Below (If Applicable).     If you need a refill on your cardiac medications before your next appointment, please call your pharmacy.

## 2018-11-09 DIAGNOSIS — Z01818 Encounter for other preprocedural examination: Secondary | ICD-10-CM

## 2018-11-10 ENCOUNTER — Other Ambulatory Visit: Payer: Self-pay

## 2018-11-10 DIAGNOSIS — Z01818 Encounter for other preprocedural examination: Secondary | ICD-10-CM

## 2018-11-23 ENCOUNTER — Telehealth (HOSPITAL_COMMUNITY): Payer: Self-pay | Admitting: Emergency Medicine

## 2018-11-23 NOTE — Telephone Encounter (Signed)
Left message on voicemail with name and callback number Charron Coultas RN Navigator Cardiac Imaging  [facility] [phone] Office [phone] Cell  

## 2018-11-23 NOTE — Telephone Encounter (Signed)
Pt returning phone call--  pt verbalizes understanding of appt date/time, parking situation and where to check in, pre-test NPO status and medications ordered, and verified current allergies; name and call back number provided for further questions should they arise Marchia Bond RN Navigator Cardiac Imaging [facility] [phone] office [phone] cell

## 2018-11-24 ENCOUNTER — Other Ambulatory Visit
Admission: RE | Admit: 2018-11-24 | Discharge: 2018-11-24 | Disposition: A | Discharge status: Home / Self Care | Payer: No Typology Code available for payment source | Source: Ambulatory Visit | Attending: Cardiovascular Disease | Admitting: Cardiovascular Disease

## 2018-11-24 DIAGNOSIS — Z01812 Encounter for preprocedural laboratory examination: Secondary | ICD-10-CM | POA: Insufficient documentation

## 2018-11-24 LAB — BASIC METABOLIC PANEL
Anion gap: 7 (ref 5–15)
BUN: 13 mg/dL (ref 6–20)
CO2: 26 mmol/L (ref 22–32)
Calcium: 8.8 mg/dL — ABNORMAL LOW (ref 8.9–10.3)
Chloride: 103 mmol/L (ref 98–111)
Creatinine, Ser: 0.68 mg/dL (ref 0.44–1.00)
GFR calc Af Amer: >60 mL/min (ref >60)
GFR calc non Af Amer: >60 mL/min (ref >60)
Glucose, Bld: 93 mg/dL (ref 70–99)
Potassium: 4.1 mmol/L (ref 3.5–5.1)
Sodium: 136 mmol/L (ref 135–145)

## 2018-11-25 ENCOUNTER — Ambulatory Visit (HOSPITAL_COMMUNITY)
Admission: RE | Admit: 2018-11-25 | Discharge: 2018-11-25 | Disposition: A | Discharge status: Home / Self Care | Payer: No Typology Code available for payment source | Source: Ambulatory Visit | Attending: Cardiovascular Disease | Admitting: Cardiovascular Disease

## 2018-11-25 ENCOUNTER — Ambulatory Visit (HOSPITAL_COMMUNITY): Admission: RE | Admit: 2018-11-25 | Payer: No Typology Code available for payment source | Source: Ambulatory Visit

## 2018-11-25 DIAGNOSIS — R0602 Shortness of breath: Secondary | ICD-10-CM | POA: Insufficient documentation

## 2018-11-25 DIAGNOSIS — R079 Chest pain, unspecified: Secondary | ICD-10-CM | POA: Diagnosis not present

## 2018-11-25 MED ORDER — METOPROLOL TARTRATE 5 MG/5ML IV SOLN
INTRAVENOUS | Status: AC
  Administered 2018-11-25: 5 mg via INTRAVENOUS
  Filled 2018-11-25: qty 10

## 2018-11-25 MED ORDER — NITROGLYCERIN 0.4 MG SL SUBL
SUBLINGUAL_TABLET | SUBLINGUAL | Status: AC
  Administered 2018-11-25: 0.8 mg via SUBLINGUAL
  Filled 2018-11-25: qty 2

## 2018-11-25 MED ORDER — NITROGLYCERIN 0.4 MG SL SUBL
0.8000 mg | SUBLINGUAL_TABLET | SUBLINGUAL | Status: DC | PRN
  Administered 2018-11-25: 0.8 mg via SUBLINGUAL
  Filled 2018-11-25 (×2): qty 25

## 2018-11-25 MED ORDER — METOPROLOL TARTRATE 5 MG/5ML IV SOLN
5.0000 mg | INTRAVENOUS | Status: DC | PRN
  Administered 2018-11-25 (×2): 5 mg via INTRAVENOUS
  Filled 2018-11-25 (×3): qty 5

## 2018-11-25 MED ORDER — IOPAMIDOL (ISOVUE-370) INJECTION 76%
80.0000 mL | Freq: Once | INTRAVENOUS | Status: AC | PRN
  Administered 2018-11-25: 80 mL via INTRAVENOUS

## 2018-12-16 ENCOUNTER — Ambulatory Visit: Payer: No Typology Code available for payment source | Admitting: Cardiovascular Disease

## 2018-12-16 ENCOUNTER — Encounter: Payer: Self-pay | Admitting: Cardiovascular Disease

## 2018-12-16 VITALS — BP 110/66 | HR 96 | Ht 62.0 in | Wt 230.0 lb

## 2018-12-16 DIAGNOSIS — R0602 Shortness of breath: Secondary | ICD-10-CM | POA: Diagnosis not present

## 2018-12-16 DIAGNOSIS — R079 Chest pain, unspecified: Secondary | ICD-10-CM

## 2018-12-16 DIAGNOSIS — Z9989 Dependence on other enabling machines and devices: Secondary | ICD-10-CM

## 2018-12-16 DIAGNOSIS — G4733 Obstructive sleep apnea (adult) (pediatric): Secondary | ICD-10-CM

## 2018-12-16 DIAGNOSIS — I10 Essential (primary) hypertension: Secondary | ICD-10-CM | POA: Diagnosis not present

## 2018-12-16 DIAGNOSIS — E785 Hyperlipidemia, unspecified: Secondary | ICD-10-CM

## 2018-12-16 DIAGNOSIS — E119 Type 2 diabetes mellitus without complications: Secondary | ICD-10-CM

## 2018-12-16 DIAGNOSIS — R002 Palpitations: Secondary | ICD-10-CM | POA: Diagnosis not present

## 2018-12-16 MED ORDER — METOPROLOL SUCCINATE ER 25 MG PO TB24: ORAL_TABLET | ORAL | 3 refills | Status: DC

## 2018-12-16 MED ORDER — SPIRONOLACTONE 25 MG PO TABS: 25.0000 mg | ORAL_TABLET | Freq: Every day | ORAL | 3 refills | Status: DC

## 2018-12-16 NOTE — Patient Instructions (Signed)
Medication Instructions: Take Toprol XL 50 mg am and 25 mg pm   START Spironolactone 25 mg daily at 12 pm  Labwork: None today  Procedures/Testing: None today  Follow-Up: 1 year with Dr.Koneswaran  Any Additional Special Instructions Will Be Listed Below (If Applicable).     If you need a refill on your cardiac medications before your next appointment, please call your pharmacy.

## 2018-12-16 NOTE — Progress Notes (Signed)
SUBJECTIVE: The patient presents for follow-up of palpitations.  Event monitoring demonstrated predominantly sinus rhythm with one episode of sinus tachycardia, 141 bpm.  Average heart rate was 91 bpm.  There were no significant arrhythmias.  Nuclear stress test was normal on 01/07/2018, EF 71%.  Echocardiogram demonstrated normal left ventricular systolic function, LVEF 60 to 65%. Diastolic function could not be assessed. Wall thickness was normal.  She has a lot of stress and anxiety related to work and the loss of her father and having to take care of her 20 year old mother.  She thinks her chest pain is due to anxiety.  She continues to have palpitations and thinks it is unassociated with her anxiety.  She wants to lose weight and exercise so she recently joined a gym.  Her mother joined with her.    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 She lives with her mother.  Her father passed away in 02-05-18. She is divorced. She has a daughter who is a Engineer, building services in [place], [place].  Review of Systems: As per "subjective", otherwise negative.  Allergies  Allergen Reactions  . Liraglutide -Weight Management     Other reaction(s): Other (See Comments) GI upset  . Sorbitan Other (See Comments)  . Adhesive [Tape] Itching and Rash    Paper tape or coban is easier on the skin.  . Latex Rash  . Penicillins Rash    Current Outpatient Medications  Medication Sig Dispense Refill  . albuterol (VENTOLIN HFA) 108 (90 Base) MCG/ACT inhaler Inhale 2 puffs into the lungs every 6 (six) hours as needed for wheezing or shortness of breath.    Marland Kitchen atorvastatin (LIPITOR) 10 MG tablet Take 1 tablet by mouth daily at 6 PM. Taking    . cetirizine (ZYRTEC) 10 MG tablet Take 1 tablet by mouth daily as needed.     . chlorpheniramine (CHLOR-TRIMETON) 4 MG tablet Take 4 mg by mouth 2 (two) times daily as needed for allergies.    .  citalopram (CELEXA) 40 MG tablet Take 40 mg by mouth daily.    . clindamycin (CLEOCIN T) 1 % SWAB Apply 1 applicator topically 2 (two) times daily as needed.    . doxycycline (VIBRA-TABS) 100 MG tablet Take 100 mg by mouth as directed.    . DULoxetine (CYMBALTA) 60 MG capsule Take 60 mg by mouth daily.    . Fluticasone-Salmeterol (ADVAIR DISKUS) 250-50 MCG/DOSE AEPB Inhale 1 puff into the lungs every 12 (twelve) hours as needed.    . furosemide (LASIX) 40 MG tablet Take 1 tablet by mouth daily as needed.    Marland Kitchen levothyroxine (SYNTHROID, LEVOTHROID) 25 MCG tablet Take 1 tablet by mouth at bedtime.     Marland Kitchen loratadine (CLARITIN) 10 MG tablet Take 10 mg by mouth daily.    . magnesium oxide (MAG-OX) 400 MG tablet Take 400 mg by mouth daily.    . metFORMIN (GLUCOPHAGE) 1000 MG tablet Take 1,000 mg by mouth 2 (two) times daily with a meal.     . Methylcobalamin (METHYL B-12 PO) Take 1 tablet by mouth daily.    . metoprolol succinate (TOPROL-XL) 50 MG 24 hr tablet Take 1 tablet by mouth at bedtime. Check BP prior to taking Metoprolol, if low hold the dose.  If SBP is elevated over 140 or DBP is above 90, pt and take 0.5 tablet.    . mometasone (NASONEX) 50 MCG/ACT nasal spray Place 2 sprays into the nose daily.    Marland Kitchen  Potassium 99 MG TABS Take 1 tablet by mouth daily.    Marland Kitchen spironolactone (ALDACTONE) 25 MG tablet Take 1 tablet by mouth 2 (two) times daily.     No current facility-administered medications for this visit.     Past Medical History:  Diagnosis Date  . Allergy   . Anxiety   . Asthma   . Cancer ([place])    melanoma on my chest  . COPD (chronic obstructive pulmonary disease) ([place])   . DDD (degenerative disc disease), cervical    and lower back  . Depression   . Diabetes mellitus without complication ([place])    type 2  . DJD (degenerative joint disease)   . Dysrhythmia   . Empty sella syndrome ([place])    followed at [facility]  . Family history of adverse reaction to anesthesia    mother almost  died with anesthesia in the past, hard to wake up and got real sick on my stomach vomiting, now she receives local anethesia.  Marland Kitchen GERD (gastroesophageal reflux disease)   . Headache    history of migraine  . History of palpitations 07/2004  . Hyperlipidemia   . Hypertension   . Hypothyroidism   . Lymphocytosis   . Neuropathy 2016  . PCOS (polycystic ovarian syndrome)   . Pituitary microadenoma ([place])    followed at [facility]  . Pneumonia 2008  . Sleep apnea   . Thyroid disease   . Von Willebrand disease ([place]) 2012    Past Surgical History:  Procedure Laterality Date  . BACK SURGERY     removal of 2 lumbar vertebrae  . BREAST BIOPSY Right 2010   NEG  . CHOLECYSTECTOMY    . COLONOSCOPY  03/03/2013, 02/06/2008  . COLONOSCOPY WITH PROPOFOL N/A 08/05/2018   Procedure: COLONOSCOPY WITH PROPOFOL;  Surgeon: Manya Silvas, MD;  Location: [facility] ENDOSCOPY;  Service: Endoscopy;  Laterality: N/A;  . EGD  02/06/2008  . HALLUX VALGUS CHEILECTOMY Left 08/21/2016   Procedure: HALLUX VALGUS CHEILECTOMY;  Surgeon: Sharlotte Alamo, DPM;  Location: [facility] ORS;  Service: Podiatry;  Laterality: Left;  . HEMORRHOID SURGERY  10/2014  . SPINE SURGERY    . TONSILLECTOMY AND ADENOIDECTOMY      Social History   Socioeconomic History  . Marital status: Divorced    Spouse name: Not on file  . Number of children: Not on file  . Years of education: Not on file  . Highest education level: Not on file  Occupational History  . Not on file  Social Needs  . Financial resource strain: Not on file  . Food insecurity:    Worry: Not on file    Inability: Not on file  . Transportation needs:    Medical: Not on file    Non-medical: Not on file  Tobacco Use  . Smoking status: Former Smoker    Years: 8.00    Types: Cigarettes    Last attempt to quit: 04/09/1994    Years since quitting: 24.7  . Smokeless tobacco: Never Used  Substance and Sexual Activity  . Alcohol use: No    Alcohol/week: 0.0 standard drinks  .  Drug use: No  . Sexual activity: Not on file  Lifestyle  . Physical activity:    Days per week: Not on file    Minutes per session: Not on file  . Stress: Not on file  Relationships  . Social connections:    Talks on phone: Not on file    Gets together: Not on file  Attends religious service: Not on file    Active member of club or organization: Not on file    Attends meetings of clubs or organizations: Not on file    Relationship status: Not on file  . Intimate partner violence:    Fear of current or ex partner: Not on file    Emotionally abused: Not on file    Physically abused: Not on file    Forced sexual activity: Not on file  Other Topics Concern  . Not on file  Social History Narrative  . Not on file     Vitals:   12/16/18 1308  BP: 110/66  Pulse: 96  SpO2: 97%  Weight: 230 lb (104.3 kg)  Height: 5\' 2"  (1.575 m)    Wt Readings from Last 3 Encounters:  12/16/18 230 lb (104.3 kg)  08/17/16 232 lb (105.2 kg)  07/29/16 232 lb 14.4 oz (105.6 kg)     PHYSICAL EXAM General: NAD HEENT: Normal. Neck: No JVD, no thyromegaly. Lungs: Clear to auscultation bilaterally with normal respiratory effort. CV: Regular rate and rhythm, normal S1/S2, no S3/S4, no murmur. No pretibial or periankle edema.  No carotid bruit.   Abdomen: Soft, nontender, no distention.  Neurologic: Alert and oriented.  Psych: Normal affect. Skin: Normal. Musculoskeletal: No gross deformities.    ECG: Reviewed above under Subjective   Labs: Lab Results  Component Value Date/Time   K 4.1 11/24/2018 11:50 AM   BUN 13 11/24/2018 11:50 AM   CREATININE 0.68 11/24/2018 11:50 AM   ALT 17 07/21/2018 07:45 AM   TSH 1.137 07/21/2018 07:45 AM   HGB 14.3 07/21/2018 07:45 AM   HGB 14.7 05/18/2013 11:50 AM     Lipids: Lab Results  Component Value Date/Time   LDLCALC 69 07/11/2015 07:55 AM   CHOL 143 07/11/2015 07:55 AM   TRIG 167 (H) 07/11/2015 07:55 AM   HDL 41 07/11/2015 07:55 AM        ASSESSMENT AND PLAN: 1.  Chest pain and shortness of breath: This is likely due to anxiety, depression, and possibly panic disorder.  Coronary CT angiography was normal with a calcium score of 0.  2.  Hypertension: Blood pressure is normal.    I will monitor given medication adjustments as noted below.  3.  Hyperlipidemia: Lipids previously reviewed.  Continue atorvastatin 10 mg.  4.  Type 2 diabetes mellitus: Currently on metformin.  5.  Sleep apnea: On CPAP.  6.  Palpitations: Event monitor reviewed above without significant arrhythmias.  She is currently on Toprol-XL 50 mg daily.  She asks about increasing the dose of Toprol-XL.  In order to prevent hypotension, I will reduce spironolactone to 25 mg and increase Toprol-XL to 50 mg every morning and 25 mg every afternoon as it appears she was taking 25 mg twice daily.   Disposition: Follow up 1 year   Kate Sable, M.D., F.A.C.C.

## 2019-01-20 ENCOUNTER — Ambulatory Visit (INDEPENDENT_AMBULATORY_CARE_PROVIDER_SITE_OTHER): Payer: Self-pay | Admitting: Internal Medicine

## 2019-02-03 MED FILL — SPIRONOLACTONE 25 MG TABS: 25 | 90 days supply | Qty: 90 | Fill #0

## 2019-02-03 MED FILL — ATORVASTATIN 10 MG TABLET: 10 | 90 days supply | Qty: 90 | Fill #0

## 2019-02-03 MED FILL — MONTELUKAST SOD 10 MG TAB: 10 | 30 days supply | Qty: 30 | Fill #0

## 2019-02-03 MED FILL — DULOXETINE HCL 60 MG CPEP: 60 | 90 days supply | Qty: 90 | Fill #0

## 2019-02-03 MED FILL — FUROSEMIDE 40 MG TAB: 40 | 30 days supply | Qty: 30 | Fill #0

## 2019-02-03 MED FILL — PROGESTERONE 200 MG CAPSULE: 200 | 30 days supply | Qty: 60 | Fill #0

## 2019-02-03 MED FILL — NP THYROID 60 MG TABLET: 60 | 30 days supply | Qty: 30 | Fill #0

## 2019-02-03 MED FILL — DULoxetine HCL 30 MG CPEP: 30 | 90 days supply | Qty: 90 | Fill #0

## 2019-02-13 MED FILL — NP THYROID 30 MG TABLET: 30 | 30 days supply | Qty: 30 | Fill #0

## 2019-02-17 MED FILL — METOPROLOL SUCCINATE ER 25: 25 | 90 days supply | Qty: 270 | Fill #0

## 2019-02-27 MED FILL — PROGESTERONE 200 MG CAPSULE: 200 | 30 days supply | Qty: 60 | Fill #1

## 2019-02-27 MED FILL — NP THYROID 60 MG TABLET: 60 | 30 days supply | Qty: 30 | Fill #1

## 2019-02-27 MED FILL — MONTELUKAST SOD 10 MG TAB: 10 | 30 days supply | Qty: 30 | Fill #1

## 2019-02-27 MED FILL — FUROSEMIDE 40 MG TAB: 40 | 30 days supply | Qty: 30 | Fill #1

## 2019-03-07 MED FILL — predniSONE 10 MG TABS: 10 | 8 days supply | Qty: 20 | Fill #0

## 2019-03-09 MED FILL — ALBUTEROL SULFATE HFA 108 (: 108 (90 BAS | 25 days supply | Qty: 18 | Fill #0

## 2019-03-09 MED FILL — NP THYROID 30 MG TABLET: 30 | 30 days supply | Qty: 30 | Fill #1

## 2019-03-11 MED FILL — NP THYROID 60 MG TABLET: 60 | 30 days supply | Qty: 60 | Fill #0

## 2019-03-13 MED FILL — predniSONE 10 MG TABS: 10 | 8 days supply | Qty: 20 | Fill #0

## 2019-03-21 MED FILL — predniSONE 10 MG TABS: 10 | 8 days supply | Qty: 20 | Fill #0

## 2019-03-29 MED FILL — FUROSEMIDE 40 MG TAB: 40 | 30 days supply | Qty: 30 | Fill #2

## 2019-03-29 MED FILL — PROGESTERONE 200 MG CAPSULE: 200 | 30 days supply | Qty: 60 | Fill #2

## 2019-03-29 MED FILL — ALBUTEROL SULFATE HFA 108 (: 108 (90 BAS | 25 days supply | Qty: 18 | Fill #1

## 2019-03-29 MED FILL — MONTELUKAST SOD 10 MG TAB: 10 | 30 days supply | Qty: 30 | Fill #2

## 2019-04-04 MED FILL — NP THYROID 60 MG TABLET: 60 | 30 days supply | Qty: 60 | Fill #1

## 2019-04-08 MED FILL — NP THYROID 30 MG TABLET: 30 | 30 days supply | Qty: 30 | Fill #2

## 2019-04-19 MED FILL — lamoTRIgine 25 MG TABS: 25 | 30 days supply | Qty: 60 | Fill #0

## 2019-04-22 MED FILL — ALBUTEROL SULFATE HFA 108 (: 108 (90 BAS | 25 days supply | Qty: 18 | Fill #2

## 2019-04-28 MED FILL — CITALOPRAM HBR 40 MG TABLET: 40 | 90 days supply | Qty: 90 | Fill #0

## 2019-04-28 MED FILL — traZODone HCL 100 MG TABS: 100 | 90 days supply | Qty: 180 | Fill #0

## 2019-04-28 MED FILL — PROGESTERONE 200 MG CAPSULE: 200 | 30 days supply | Qty: 60 | Fill #0

## 2019-04-28 MED FILL — ATORVASTATIN 10 MG TABLET: 10 | 90 days supply | Qty: 90 | Fill #0

## 2019-04-28 MED FILL — MONTELUKAST SOD 10 MG TAB: 10 | 30 days supply | Qty: 30 | Fill #3

## 2019-04-28 MED FILL — DULOXETINE HCL 60 MG CPEP: 60 | 90 days supply | Qty: 90 | Fill #0

## 2019-04-28 MED FILL — SPIRONOLACTONE 25 MG TABS: 25 | 90 days supply | Qty: 90 | Fill #1

## 2019-04-28 MED FILL — DULoxetine HCL 30 MG CPEP: 30 | 90 days supply | Qty: 90 | Fill #0

## 2019-04-28 MED FILL — HYDROXYZINE PAMOATE 25 MG C: 25 | 90 days supply | Qty: 180 | Fill #0

## 2019-04-28 MED FILL — FUROSEMIDE 40 MG TAB: 40 | 30 days supply | Qty: 30 | Fill #3

## 2019-05-02 MED FILL — ACCU-CHEK FASTCLIX LANCETS: 90 days supply | Qty: 102 | Fill #0

## 2019-05-02 MED FILL — ACCU-CHEK GUIDE TEST STRIP: 90 days supply | Qty: 100 | Fill #0

## 2019-05-04 MED FILL — NP THYROID 60 MG TABLET: 60 | 30 days supply | Qty: 60 | Fill #2

## 2019-05-12 MED FILL — METOPROLOL SUCCINATE ER 25: 25 | 90 days supply | Qty: 270 | Fill #1

## 2019-05-17 MED FILL — ALBUTEROL SULFATE HFA 108 (: 108 (90 BAS | 25 days supply | Qty: 18 | Fill #3

## 2019-05-22 MED FILL — PROGESTERONE 200 MG CAPSULE: 200 | 30 days supply | Qty: 60 | Fill #1

## 2019-05-29 MED FILL — MONTELUKAST SOD 10 MG TAB: 10 | 30 days supply | Qty: 30 | Fill #4

## 2019-05-29 MED FILL — FUROSEMIDE 40 MG TAB: 40 | 30 days supply | Qty: 30 | Fill #4

## 2019-06-03 MED FILL — NP THYROID 60 MG TABLET: 60 | 30 days supply | Qty: 60 | Fill #3

## 2019-06-05 MED FILL — NP THYROID 90 MG TABLET: 90 | 30 days supply | Qty: 60 | Fill #0

## 2019-06-06 MED FILL — CLINDAMYCIN PH 1% GEL: 1 | 14 days supply | Qty: 30 | Fill #0

## 2019-06-06 MED FILL — DOXYCYCLINE HYC 100 MG CAPS: 100 | 30 days supply | Qty: 60 | Fill #0

## 2019-06-21 MED FILL — PROGESTERONE 200 MG CAPSULE: 200 | 30 days supply | Qty: 60 | Fill #2

## 2019-06-27 MED FILL — MONTELUKAST SOD 10 MG TAB: 10 | 30 days supply | Qty: 30 | Fill #5

## 2019-06-27 MED FILL — FUROSEMIDE 40 MG TAB: 40 | 90 days supply | Qty: 90 | Fill #0

## 2019-06-29 MED FILL — NP THYROID 90 MG TABLET: 90 | 30 days supply | Qty: 60 | Fill #1

## 2019-06-30 MED FILL — DOXYCYCLINE HYC 100 MG CAPS: 100 | 30 days supply | Qty: 60 | Fill #1

## 2019-07-06 ENCOUNTER — Other Ambulatory Visit: Payer: Self-pay | Admitting: Cardiovascular Disease

## 2019-07-06 MED FILL — SUBVENITE 150 MG TABS: 150 | 30 days supply | Qty: 30 | Fill #0

## 2019-07-06 MED FILL — metFORMIN HCL 1000 MG TABS: 1000 | 90 days supply | Qty: 180 | Fill #0

## 2019-07-21 ENCOUNTER — Other Ambulatory Visit (INDEPENDENT_AMBULATORY_CARE_PROVIDER_SITE_OTHER): Payer: Self-pay | Admitting: Internal Medicine

## 2019-07-21 MED ORDER — PROGESTERONE MICRONIZED 200 MG PO CAPS: 400.0000 mg | ORAL_CAPSULE | Freq: Every evening | ORAL | 3 refills | Status: DC

## 2019-07-21 MED FILL — DULoxetine HCL 30 MG CPEP: 30 | 90 days supply | Qty: 90 | Fill #1

## 2019-07-21 MED FILL — traZODone HCL 100 MG TABS: 100 | 90 days supply | Qty: 180 | Fill #1

## 2019-07-21 MED FILL — PROGESTERONE 200 MG CAPSULE: 200 | 30 days supply | Qty: 60 | Fill #0

## 2019-07-21 MED FILL — predniSONE 10 MG TABS: 10 | 12 days supply | Qty: 30 | Fill #0

## 2019-07-21 MED FILL — ATORVASTATIN 10 MG TABLET: 10 | 90 days supply | Qty: 90 | Fill #0

## 2019-07-21 MED FILL — CITALOPRAM HBR 40 MG TABLET: 40 | 90 days supply | Qty: 90 | Fill #1

## 2019-07-21 MED FILL — DULOXETINE HCL 60 MG CPEP: 60 | 90 days supply | Qty: 90 | Fill #1

## 2019-07-21 MED FILL — HYDROXYZINE PAMOATE 25 MG C: 25 | 90 days supply | Qty: 180 | Fill #0

## 2019-07-21 MED FILL — SULFAMETHOXAZOLE-TMP DS TAB: 800-160 | 5 days supply | Qty: 10 | Fill #0

## 2019-07-25 MED FILL — ACCU-CHEK FASTCLIX LANCETS: 90 days supply | Qty: 102 | Fill #1

## 2019-07-25 MED FILL — ACCU-CHEK GUIDE TEST STRIP: 50 days supply | Qty: 50 | Fill #1

## 2019-07-27 MED FILL — MONTELUKAST SOD 10 MG TAB: 10 | 30 days supply | Qty: 30 | Fill #6

## 2019-07-27 MED FILL — SPIRONOLACTONE 25 MG TABS: 25 | 90 days supply | Qty: 90 | Fill #2

## 2019-07-30 MED FILL — DOXYCYCLINE HYC 100 MG CAPS: 100 | 30 days supply | Qty: 60 | Fill #2

## 2019-07-31 MED FILL — SUBVENITE 150 MG TABS: 150 | 30 days supply | Qty: 30 | Fill #1

## 2019-08-10 MED FILL — METOPROLOL SUCCINATE ER 25: 25 | 90 days supply | Qty: 270 | Fill #2

## 2019-08-15 MED FILL — VALACYCLOVIR HCL 500 MG TAB: 500 | 3 days supply | Qty: 6 | Fill #0

## 2019-08-15 MED FILL — PROGESTERONE 200 MG CAPSULE: 200 | 30 days supply | Qty: 60 | Fill #1

## 2019-08-29 MED FILL — SUBVENITE 150 MG TABS: 150 | 30 days supply | Qty: 30 | Fill #2

## 2019-08-29 MED FILL — DOXYCYCLINE HYC 100 MG CAPS: 100 | 30 days supply | Qty: 60 | Fill #3

## 2019-09-06 ENCOUNTER — Ambulatory Visit (INDEPENDENT_AMBULATORY_CARE_PROVIDER_SITE_OTHER): Payer: No Typology Code available for payment source | Admitting: Internal Medicine

## 2019-09-06 MED FILL — metFORMIN HCL 1000 MG TABS: 1000 | 90 days supply | Qty: 180 | Fill #1

## 2019-09-13 MED FILL — PROGESTERONE 200 MG CAPSULE: 200 | 30 days supply | Qty: 60 | Fill #2

## 2019-09-13 MED FILL — ACCU-CHEK GUIDE TEST STRIP: 50 days supply | Qty: 50 | Fill #2

## 2019-09-19 MED FILL — FUROSEMIDE 40 MG TAB: 40 | 90 days supply | Qty: 90 | Fill #1

## 2019-09-25 MED FILL — MONTELUKAST SOD 10 MG TAB: 10 | 30 days supply | Qty: 30 | Fill #8

## 2019-10-17 MED FILL — ATORVASTATIN 10 MG TABLET: 10 | 90 days supply | Qty: 90 | Fill #1

## 2019-10-17 MED FILL — PROGESTERONE MICRONIZED 200: 200 | 30 days supply | Qty: 60 | Fill #3

## 2019-10-17 MED FILL — HYDROXYZINE PAMOATE 25 MG C: 25 | 90 days supply | Qty: 180 | Fill #0

## 2019-10-21 MED FILL — DULOXETINE HCL 60 MG CPEP: 60 | 90 days supply | Qty: 90 | Fill #0

## 2019-10-21 MED FILL — CITALOPRAM HBR 40 MG TABLET: 40 | 90 days supply | Qty: 90 | Fill #0

## 2019-10-21 MED FILL — DULoxetine HCL 30 MG CPEP: 30 | 90 days supply | Qty: 90 | Fill #0

## 2019-10-21 MED FILL — traZODone HCL 100 MG TABS: 100 | 90 days supply | Qty: 180 | Fill #0

## 2019-10-25 MED FILL — SPIRONOLACTONE 25 MG TABS: 25 | 90 days supply | Qty: 90 | Fill #3

## 2019-10-25 MED FILL — MONTELUKAST SOD 10 MG TAB: 10 | 30 days supply | Qty: 30 | Fill #9

## 2019-11-03 MED FILL — DOXYCYCLINE HYC 100 MG CAPS: 100 | 30 days supply | Qty: 60 | Fill #0

## 2019-11-06 ENCOUNTER — Ambulatory Visit (INDEPENDENT_AMBULATORY_CARE_PROVIDER_SITE_OTHER): Payer: No Typology Code available for payment source | Admitting: Internal Medicine

## 2019-11-08 ENCOUNTER — Other Ambulatory Visit: Payer: Self-pay | Admitting: Cardiovascular Disease

## 2019-11-08 MED FILL — METOPROLOL SUCCINATE ER 25: 25 | 90 days supply | Qty: 270 | Fill #0

## 2019-11-10 ENCOUNTER — Other Ambulatory Visit: Payer: Self-pay | Admitting: Podiatry

## 2019-11-13 MED FILL — PROGESTERONE MICRONIZED 200: 200 | 30 days supply | Qty: 60 | Fill #0

## 2019-11-15 ENCOUNTER — Other Ambulatory Visit
Admission: RE | Admit: 2019-11-15 | Discharge: 2019-11-15 | Disposition: A | Discharge status: Home / Self Care | Payer: No Typology Code available for payment source | Source: Ambulatory Visit | Attending: Podiatry | Admitting: Podiatry

## 2019-11-15 ENCOUNTER — Other Ambulatory Visit: Payer: Self-pay

## 2019-11-15 ENCOUNTER — Encounter
Admission: RE | Admit: 2019-11-15 | Discharge: 2019-11-15 | Disposition: A | Discharge status: Home / Self Care | Payer: No Typology Code available for payment source | Source: Ambulatory Visit | Attending: Podiatry | Admitting: Podiatry

## 2019-11-15 DIAGNOSIS — Z01818 Encounter for other preprocedural examination: Secondary | ICD-10-CM | POA: Insufficient documentation

## 2019-11-15 DIAGNOSIS — Z20822 Contact with and (suspected) exposure to covid-19: Secondary | ICD-10-CM | POA: Diagnosis not present

## 2019-11-15 HISTORY — DX: Prediabetes: R73.03

## 2019-11-15 HISTORY — DX: Personal history of Methicillin resistant Staphylococcus aureus infection: Z86.14

## 2019-11-15 HISTORY — DX: Cardiac murmur, unspecified: R01.1

## 2019-11-15 LAB — SARS CORONAVIRUS 2 (TAT 6-24 HRS): SARS Coronavirus 2: NEGATIVE

## 2019-11-15 NOTE — Patient Instructions (Addendum)
Your procedure is scheduled on: 11-17-19 FRIDAY Report to Same Day Surgery 2nd floor medical mall [facility] Entrance-take elevator on left to 2nd floor.  Check in with surgery information desk.) To find out your arrival time please call [phone] between 1PM - 3PM on 11-16-19 THURSDAY  Remember: Instructions that are not followed completely may result in serious medical risk, up to and including death, or upon the discretion of your surgeon and anesthesiologist your surgery may need to be rescheduled.    _x___ 1. Do not eat food after midnight the night before your procedure. NO GUM OR CANDY AFTER MIDNIGHT. You may drink clear liquids up to 2 hours before you are scheduled to arrive at the hospital for your procedure.  Do not drink clear liquids within 2 hours of your scheduled arrival to the hospital.  Clear liquids include  --Water or Apple juice without pulp  -- Gatorade  --Black Coffee or Clear Tea (No milk, no creamers, do not add anything to the coffee or Tea   ____Ensure clear carbohydrate drink on the way to the hospital for bariatric patients  _X___GATORADE G2 drink 3 hours PRIOR TO ARRIVAL TIME TO HOSPITAL-IF ARRIVAL TIME IS 6 AM, HAVE GATORADE FINISHED BY 4:30 AM-OTHERWISE FINISH GATORADE 3 HOURS PRIOR TO YOUR ARRIVAL TIME TO HOSPITAL     __x__ 2. No Alcohol for 24 hours before or after surgery.   __x__3. No Smoking or e-cigarettes for 24 prior to surgery.  Do not use any chewable tobacco products for at least 6 hour prior to surgery   ____  4. Bring all medications with you on the day of surgery if instructed.    __x__ 5. Notify your doctor if there is any change in your medical condition     (cold, fever, infections).    x___6. On the morning of surgery brush your teeth with toothpaste and water.  You may rinse your mouth with mouth wash if you wish.  Do not swallow any toothpaste or mouthwash.   Do not wear jewelry, make-up, hairpins, clips or nail polish.  Do  not wear lotions, powders, or perfumes.   Do not shave 48 hours prior to surgery. Men may shave face and neck.  Do not bring valuables to the hospital.    [facility] is not responsible for any belongings or valuables.               Contacts, dentures or bridgework may not be worn into surgery.  Leave your suitcase in the car. After surgery it may be brought to your room.  For patients admitted to the hospital, discharge time is determined by your treatment team.  _  Patients discharged the day of surgery will not be allowed to drive home.  You will need someone to drive you home and stay with you the night of your procedure.    Please read over the following fact sheets that you were given:   [facility] Preparing for Surgery/INCENTIVE SPIROMETER   _x___ TAKE THE FOLLOWING MEDICATION THE MORNING OF SURGERY WITH A SMALL SIP OF WATER. These include:  1. CELEXA (CITALOPRAM)  2. CYMBALTA (DULOXETINE)  3. LAMICTAL (LAMOTRIGENE)  4. METOPROLOL  5. PRILOSEC (OMEPRAZOLE)  6. TAKE A PRILOSEC THE NIGHT BEFORE YOUR SURGERY  7.  ____Fleets enema or Magnesium Citrate as directed.   _x___ Use CHG Soap or sage wipes as directed on instruction sheet   _X___ Use inhalers on the day of surgery and bring to hospital  day of surgery-USE YOUR ALBUTEROL INHALER AM OF SURGERY AND BRING INHALER TO HOSPITAL  __X__ Stop Metformin 2 days prior to surgery-STOP NOW (LAST DOSE WAS 11-14-19)    ____ Take 1/2 of usual insulin dose the night before surgery and none on the morning surgery.   ____ Follow recommendations from Cardiologist, Pulmonologist or PCP regarding stopping Aspirin, Coumadin, Plavix ,Eliquis, Effient, or Pradaxa, and Pletal.  X____Stop Anti-inflammatories such as Advil, Aleve, Ibuprofen, Motrin, Naproxen, Naprosyn, Goodies powders or aspirin products NOW-OK to take Tylenol   ____ Stop supplements until after surgery.     _X___ Bring C-Pap to the hospital.

## 2019-11-16 ENCOUNTER — Other Ambulatory Visit: Payer: No Typology Code available for payment source

## 2019-11-16 ENCOUNTER — Telehealth: Payer: Self-pay

## 2019-11-16 ENCOUNTER — Telehealth: Payer: Self-pay | Admitting: Cardiovascular Disease

## 2019-11-16 ENCOUNTER — Encounter: Payer: Self-pay | Admitting: Anesthesiology

## 2019-11-16 ENCOUNTER — Encounter
Admission: RE | Admit: 2019-11-16 | Discharge: 2019-11-16 | Disposition: A | Discharge status: Home / Self Care | Payer: No Typology Code available for payment source | Source: Ambulatory Visit | Attending: Podiatry | Admitting: Podiatry

## 2019-11-16 DIAGNOSIS — Z01818 Encounter for other preprocedural examination: Secondary | ICD-10-CM | POA: Diagnosis not present

## 2019-11-16 LAB — CBC
HCT: 42.8 % (ref 36.0–46.0)
Hemoglobin: 14.1 g/dL (ref 12.0–15.0)
MCH: 29.7 pg (ref 26.0–34.0)
MCHC: 32.9 g/dL (ref 30.0–36.0)
MCV: 90.1 fL (ref 80.0–100.0)
Platelets: 394 10*3/uL (ref 150–400)
RBC: 4.75 MIL/uL (ref 3.87–5.11)
RDW: 13.9 % (ref 11.5–15.5)
WBC: 12.6 10*3/uL — ABNORMAL HIGH (ref 4.0–10.5)
nRBC: 0 % (ref 0.0–0.2)

## 2019-11-16 LAB — BASIC METABOLIC PANEL
Anion gap: 10 (ref 5–15)
BUN: 19 mg/dL (ref 6–20)
CO2: 26 mmol/L (ref 22–32)
Calcium: 9.1 mg/dL (ref 8.9–10.3)
Chloride: 102 mmol/L (ref 98–111)
Creatinine, Ser: 0.75 mg/dL (ref 0.44–1.00)
GFR calc Af Amer: >60 mL/min (ref >60)
GFR calc non Af Amer: >60 mL/min (ref >60)
Glucose, Bld: 159 mg/dL — ABNORMAL HIGH (ref 70–99)
Potassium: 4 mmol/L (ref 3.5–5.1)
Sodium: 138 mmol/L (ref 135–145)

## 2019-11-16 NOTE — Telephone Encounter (Signed)

## 2019-11-16 NOTE — Pre-Procedure Instructions (Signed)
Heather Burnett at Dr Karna Christmas office and informed her that pt needs medical clearance for abnormal EKG. Ekg and clearance faxed to [facility] office and Dr Barrie Lyme office. Margarita Grizzle aware that if Dr Caryl Comes will not clear pt that she will have to cancel pts surgery with the OR and inform pt

## 2019-11-16 NOTE — Pre-Procedure Instructions (Signed)
Secure chat with Dr Amie Critchley:  Pt having achilles tendon repair tomorrow with Vickki Muff. Pt has htn, sleep apnea and copd. Ekg done today showing t wave inversion in inferior lead. Can you compare EKG with the one from 2019 and let me know if pt needs clearance?  Yes, with that change I think it should be evaluated   medical clearance?   yes

## 2019-11-16 NOTE — Telephone Encounter (Signed)
Pt is aware of 11-21-19 virtual visit, however she is questioning whether Dr.Koneswaren will now have to see her since her recent EKG done today. Pt states EKG is in results location as of 8:15am. Phone: [phone] may leaver a voice mail or message with her Mother.  Thanks renee

## 2019-11-17 ENCOUNTER — Encounter: Payer: Self-pay | Admitting: Cardiovascular Disease

## 2019-11-17 ENCOUNTER — Ambulatory Visit (INDEPENDENT_AMBULATORY_CARE_PROVIDER_SITE_OTHER): Payer: No Typology Code available for payment source | Admitting: Cardiovascular Disease

## 2019-11-17 ENCOUNTER — Other Ambulatory Visit: Payer: Self-pay

## 2019-11-17 VITALS — BP 122/74 | HR 76 | Temp 98.7°F | Ht 62.0 in | Wt 246.0 lb

## 2019-11-17 DIAGNOSIS — R0609 Other forms of dyspnea: Secondary | ICD-10-CM

## 2019-11-17 DIAGNOSIS — R0600 Dyspnea, unspecified: Secondary | ICD-10-CM

## 2019-11-17 DIAGNOSIS — Z01818 Encounter for other preprocedural examination: Secondary | ICD-10-CM | POA: Diagnosis not present

## 2019-11-17 DIAGNOSIS — R002 Palpitations: Secondary | ICD-10-CM | POA: Diagnosis not present

## 2019-11-17 DIAGNOSIS — G4733 Obstructive sleep apnea (adult) (pediatric): Secondary | ICD-10-CM

## 2019-11-17 DIAGNOSIS — I10 Essential (primary) hypertension: Secondary | ICD-10-CM

## 2019-11-17 DIAGNOSIS — E785 Hyperlipidemia, unspecified: Secondary | ICD-10-CM

## 2019-11-17 DIAGNOSIS — R079 Chest pain, unspecified: Secondary | ICD-10-CM | POA: Diagnosis not present

## 2019-11-17 DIAGNOSIS — Z9989 Dependence on other enabling machines and devices: Secondary | ICD-10-CM

## 2019-11-17 DIAGNOSIS — R9431 Abnormal electrocardiogram [ECG] [EKG]: Secondary | ICD-10-CM

## 2019-11-17 DIAGNOSIS — E119 Type 2 diabetes mellitus without complications: Secondary | ICD-10-CM

## 2019-11-17 MED ORDER — METOPROLOL SUCCINATE ER 50 MG PO TB24: 50.0000 mg | ORAL_TABLET | Freq: Two times a day (BID) | ORAL | 3 refills | Status: DC

## 2019-11-17 MED FILL — METOPROLOL SUCCINATE ER 50: 50 | 90 days supply | Qty: 180 | Fill #0

## 2019-11-17 NOTE — Progress Notes (Signed)
SUBJECTIVE: The patient presents for evaluation of dyspnea on exertion and abnormal ECG.  She was apparently supposed undergo foot surgery today which was canceled due to an abnormal ECG.  She called our office complaining of dyspnea on exertion when she climbed a flight of stairs and was asked about getting a cardiac catheterization.  Nuclear stress test was normal on 01/07/2018, EF 71%.  Echocardiogram demonstrated normal left ventricular systolic function, LVEF 60 to 65%. Diastolic function could not be assessed. Wall thickness was normal.  Coronary CT angiography was normal with a calcium score of 0 on 11/26/2018.  She has a lot of stress and anxiety related to work and the loss of her father and having to take care of her 47 year old mother.  She has a history of anxiety and palpitations.  I personally reviewed ECG performed yesterday which demonstrates sinus rhythm with inferior T wave inversions in leads III and aVF which are new when compared to prior ECGs.  She continues to complain of exertional dyspnea when climbing a few flight of stairs.  She has chest pains which she believes are related to anxiety.  She tore her left Achilles tendon require surgery.  She has been having tingling and numbness in her legs and her feet.  She does have a history of lower back surgery.  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 She lives with her mother.  Her father passed away in 01/19/2018. She is divorced. She has a daughter who is a Engineer, building services in [place], [place].   Review of Systems: As per "subjective", otherwise negative.  Allergies  Allergen Reactions  . Penicillins Shortness Of Breath, Swelling and Rash    Did it involve swelling of the face/tongue/throat, SOB, or low BP? Yes Did it involve sudden or severe rash/hives, skin peeling, or any reaction on the inside of your mouth or nose? Yes Did you need to seek  medical attention at a hospital or doctor's office? Yes When did it last happen?Age 72 or 73 If all above answers are "NO", may proceed with cephalosporin use.   . Liraglutide -Weight Management Other (See Comments)    GI upset  . Sorbitan Other (See Comments)    GI upset  . Adhesive [Tape] Itching and Rash    Paper tape or coban is easier on the skin.  . Latex Rash    Current Outpatient Medications  Medication Sig Dispense Refill  . acetaminophen (TYLENOL) 325 MG tablet Take 650 mg by mouth every 6 (six) hours as needed.    Marland Kitchen acetaminophen (TYLENOL) 500 MG tablet Take 500 mg by mouth every 6 (six) hours as needed.    Marland Kitchen albuterol (VENTOLIN HFA) 108 (90 Base) MCG/ACT inhaler Inhale 2 puffs into the lungs every 6 (six) hours as needed for wheezing or shortness of breath.    Marland Kitchen atorvastatin (LIPITOR) 10 MG tablet Take 10 mg by mouth daily at 6 PM.     . cetirizine (ZYRTEC) 10 MG tablet Take 10 mg by mouth daily as needed (seasonal allergies.).     Marland Kitchen chlorpheniramine (CHLOR-TRIMETON) 4 MG tablet Take 4 mg by mouth 2 (two) times daily as needed (seasonal allergies.).     Marland Kitchen Cholecalciferol (VITAMIN D-3) 125 MCG (5000 UT) TABS Take 5,000 Units by mouth daily.    . citalopram (CELEXA) 40 MG tablet Take 20 mg by mouth every morning.     . clindamycin (CLINDAGEL) 1 % gel Apply 1 application topically  daily as needed (acne).     Marland Kitchen doxycycline (VIBRAMYCIN) 100 MG capsule Take 100 mg by mouth 2 (two) times daily as needed (acne flare ups).     . DULoxetine (CYMBALTA) 30 MG capsule Take 30 mg by mouth daily in the afternoon.    . DULoxetine (CYMBALTA) 60 MG capsule Take 60 mg by mouth daily.    . Fluticasone-Salmeterol (ADVAIR DISKUS) 250-50 MCG/DOSE AEPB Inhale 1 puff into the lungs every 12 (twelve) hours as needed (seasonal allergies).     . furosemide (LASIX) 40 MG tablet Take 40 mg by mouth daily in the afternoon.     . hydrOXYzine (ATARAX/VISTARIL) 25 MG tablet Take 25 mg by mouth at  bedtime.    . lamoTRIgine (LAMICTAL) 200 MG tablet Take 200 mg by mouth every morning.     . metFORMIN (GLUCOPHAGE) 1000 MG tablet Take 1,000 mg by mouth 2 (two) times daily with a meal. Lunch & supper    . metoprolol succinate (TOPROL-XL) 25 MG 24 hr tablet TAKE 2 TABLETS (50MG ) BY MOUTH IN THE MORNING AND 1 TABLET (25MG ) BY MOUTH IN THE EVENING (Patient taking differently: Take 50 mg by mouth 2 (two) times daily. ) 270 tablet 2  . montelukast (SINGULAIR) 10 MG tablet Take 10 mg by mouth at bedtime as needed.    . NP THYROID 90 MG tablet Take 90 mg by mouth every evening.    . Omeprazole Magnesium (PRILOSEC OTC PO) Take 1 tablet by mouth as needed.    . progesterone (PROMETRIUM) 200 MG capsule TAKE 2 CAPSULES BY MOUTH EVERY NIGHT (Patient taking differently: Take 200 mg by mouth at bedtime. ) 60 capsule 2  . spironolactone (ALDACTONE) 25 MG tablet Take 1 tablet (25 mg total) by mouth daily. (Patient taking differently: Take 25 mg by mouth 2 (two) times daily. ) 90 tablet 3  . traZODone (DESYREL) 50 MG tablet Take 50 mg by mouth at bedtime as needed for sleep.     No current facility-administered medications for this visit.    Past Medical History:  Diagnosis Date  . Allergy   . Anxiety   . Asthma    WELL CONTROLLED  . Cancer ([place])    melanoma on my chest  . COPD (chronic obstructive pulmonary disease) ([place])   . DDD (degenerative disc disease), cervical    and lower back  . Depression   . DJD (degenerative joint disease)   . Dysrhythmia   . Empty sella syndrome ([place])    follow[facility] at [facility]  . Family history of adverse reaction to anesthesia    mother almost died with anesthesia in the past, hard to wake up and got real sick on my stomach vomiting, now she receives local anethesia.  Marland Kitchen GERD (gastroesophageal reflux disease)   . Headache    history of migraine  . Heart murmur   . History of methicillin resistant staphylococcus aureus (MRSA)   . History of palpitations 07/2004  .  Hyperlipidemia   . Hypertension   . Hypothyroidism   . Lymphocytosis   . Neuropathy 2016  . PCOS (polycystic ovarian syndrome)   . Pituitary microadenoma ([place])    [facility]  . Pneumonia 2008  . Pre-diabetes   . Sleep apnea    USES CPAP  . Thyroid disease   . Von Willebrand disease ([place]) 2012   PT STATES SHE HAS NEVER HAD ANY ISSUES WITH BLEEDING    Past Surgical History:  Procedure Laterality Date  .  BACK SURGERY     removal of 2 lumbar vertebrae  . BREAST BIOPSY Right 2010   NEG  . CHOLECYSTECTOMY    . COLONOSCOPY  03/03/2013, 02/06/2008  . COLONOSCOPY WITH PROPOFOL N/A 08/05/2018   Procedure: COLONOSCOPY WITH PROPOFOL;  Surgeon: Manya Silvas, MD;  [facility] ENDOSCOPY;  Service: Endoscopy;  Laterality: N/A;  . EGD  02/06/2008  . HALLUX VALGUS CHEILECTOMY Left 08/21/2016   Procedure: HALLUX VALGUS CHEILECTOMY;  Surgeon: Sharlotte Alamo, DPM;  Location: [facility] ORS;  Service: Podiatry;  Laterality: Left;  . HEMORRHOID SURGERY  10/2014  . SPINE SURGERY    . TONSILLECTOMY AND ADENOIDECTOMY      Social History   Socioeconomic History  . Marital status: Divorced    Spouse name: Not on file  . Number of children: Not on file  . Years of education: Not on file  . Highest education level: Not on file  Occupational History  . Not on file  Tobacco Use  . Smoking status: Former Smoker    Years: 8.00    Types: Cigarettes    Quit date: 04/09/1994    Years since quitting: 25.6  . Smokeless tobacco: Never Used  Substance and Sexual Activity  . Alcohol use: No    Alcohol/week: 0.0 standard drinks  . Drug use: No  . Sexual activity: Not on file  Other Topics Concern  . Not on file  Social History Narrative  . Not on file   Social Determinants of Health   Financial Resource Strain:   . Difficulty of Paying Living Expenses: Not on file  Food Insecurity:   . Worried About Charity fundraiser in the Last Year: Not on file  . Ran Out of Food in the Last Year: Not on  file  Transportation Needs:   . Lack of Transportation (Medical): Not on file  . Lack of Transportation (Non-Medical): Not on file  Physical Activity:   . Days of Exercise per Week: Not on file  . Minutes of Exercise per Session: Not on file  Stress:   . Feeling of Stress : Not on file  Social Connections:   . Frequency of Communication with Friends and Family: Not on file  . Frequency of Social Gatherings with Friends and Family: Not on file  . Attends Religious Services: Not on file  . Active Member of Clubs or Organizations: Not on file  . Attends Archivist Meetings: Not on file  . Marital Status: Not on file  Intimate Partner Violence:   . Fear of Current or Ex-Partner: Not on file  . Emotionally Abused: Not on file  . Physically Abused: Not on file  . Sexually Abused: Not on file     Vitals:   11/17/19 1426  BP: 122/74  Pulse: 76  Temp: 98.7 F (37.1 C)  SpO2: 98%  Height: 5\' 2"  (1.575 m)    Wt Readings from Last 3 Encounters:  11/15/19 231 lb 0.7 oz (104.8 kg)  12/16/18 230 lb (104.3 kg)  10/07/18 230 lb (104.3 kg)     PHYSICAL EXAM General: NAD, obese female HEENT: Normal. Neck: No JVD, no thyromegaly. Lungs: Clear to auscultation bilaterally with normal respiratory effort. CV: Regular rate and rhythm, normal S1/S2, no S3/S4, no murmur. No pretibial or periankle edema.  No carotid bruit.   Abdomen: Soft, nontender, no distention.  Neurologic: Alert and oriented.  Psych: Normal affect. Skin: Normal. Musculoskeletal: No gross deformities.      Labs: Lab Results  Component Value Date/Time   K 4.0 11/16/2019 08:15 AM   BUN 19 11/16/2019 08:15 AM   CREATININE 0.75 11/16/2019 08:15 AM   ALT 17 07/21/2018 07:45 AM   TSH 1.137 07/21/2018 07:45 AM   HGB 14.1 11/16/2019 08:15 AM   HGB 14.7 05/18/2013 11:50 AM     Lipids: Lab Results  Component Value Date/Time   LDLCALC 69 07/11/2015 07:55 AM   CHOL 143 07/11/2015 07:55 AM   TRIG 167  (H) 07/11/2015 07:55 AM   HDL 41 07/11/2015 07:55 AM       ASSESSMENT AND PLAN: 1.  Chest pain: This is likely due to anxiety, depression, and possibly panic disorder.  Coronary CT angiography was normal with a calcium score of 0.  2. Hypertension:Blood pressure is normal.   I will monitor as I am stopping spironolactone and increasing Toprol-XL to 50 mg twice daily.  3. Hyperlipidemia: Lipids previously reviewed. Continue atorvastatin 10 mg.  4. Type 2 diabetes mellitus: Currently on metformin.  5. Sleep apnea: On CPAP.  6. Palpitations: Event monitor reviewed above without significant arrhythmias. She is currently on Toprol-XL 50 mg every morning and 25 mg every afternoon.  She continues to experience palpitations and dyspnea when climbing a flight of stairs.  I will increase Toprol-XL to 50 mg twice daily.  I will stop spironolactone so as not to precipitate hypotension.  I suspect a lot of this is due to cardiovascular deconditioning which I explained to her.  I gave her the option of not adjusting medications and allowing her to lose weight and exercise after her Achilles surgery, but she prefers that I increase the dose of Toprol-XL.  7.  Abnormal EKG: Inferior T wave inversions are nonspecific in the setting of a normal coronary CT angiogram.  I suspect more of this has to do with lead placement.  8.  Preoperative risk ratification: She can proceed with left Achilles surgery as planned with a low risk of major adverse cardiovascular events.    Disposition: Follow up 1 yr virtually  Time spent: 40 minutes, of which greater than 50% was spent reviewing symptoms, relevant blood tests and studies, and discussing management plan with the patient.    Kate Sable, M.D., F.A.C.C.

## 2019-11-17 NOTE — Addendum Note (Signed)
Addended by: Barbarann Ehlers A on: 11/17/2019 03:23 PM   Modules accepted: Orders

## 2019-11-17 NOTE — Patient Instructions (Signed)
Medication Instructions:  STOP Spironolactone    INCREASE Toprol XL 50 mg twice a day    *If you need a refill on your cardiac medications before your next appointment, please call your pharmacy*  Lab Work: None today  If you have labs (blood work) drawn today and your tests are completely normal, you will receive your results only by: Marland Kitchen MyChart Message (if you have MyChart) OR . A paper copy in the mail If you have any lab test that is abnormal or we need to change your treatment, we will call you to review the results.  Testing/Procedures: None today   Follow-Up: At [facility], you and your health needs are our priority.  As part of our continuing mission to provide you with exceptional heart care, we have created designated Provider Care Teams.  These Care Teams include your primary Cardiologist (physician) and Advanced Practice Providers (APPs -  Physician Assistants and Nurse Practitioners) who all work together to provide you with the care you need, when you need it.  Your next appointment:   12 month(s)  The format for your next appointment:   Virtual Visit   Provider:   Kate Sable, MD  Other Instructions None      Thank you for choosing [facility] !

## 2019-11-21 ENCOUNTER — Telehealth: Payer: No Typology Code available for payment source | Admitting: Cardiovascular Disease

## 2019-11-24 MED FILL — MONTELUKAST SOD 10 MG TAB: 10 | 30 days supply | Qty: 30 | Fill #10

## 2019-11-29 ENCOUNTER — Other Ambulatory Visit: Payer: No Typology Code available for payment source

## 2019-11-30 ENCOUNTER — Telehealth (INDEPENDENT_AMBULATORY_CARE_PROVIDER_SITE_OTHER): Payer: No Typology Code available for payment source | Admitting: Internal Medicine

## 2019-11-30 ENCOUNTER — Encounter (INDEPENDENT_AMBULATORY_CARE_PROVIDER_SITE_OTHER): Payer: Self-pay | Admitting: Internal Medicine

## 2019-11-30 DIAGNOSIS — I10 Essential (primary) hypertension: Secondary | ICD-10-CM | POA: Diagnosis not present

## 2019-11-30 DIAGNOSIS — Z6841 Body Mass Index (BMI) 40.0 and over, adult: Secondary | ICD-10-CM

## 2019-11-30 MED ORDER — NP THYROID 90 MG PO TABS: 90.0000 mg | ORAL_TABLET | Freq: Every evening | ORAL | 2 refills | Status: DC

## 2019-11-30 MED FILL — NP THYROID 90 MG TABLET: 90 | 30 days supply | Qty: 30 | Fill #0

## 2019-11-30 NOTE — Progress Notes (Signed)
Metrics: Intervention Frequency ACO  Documented Smoking Status Yearly  Screened one or more times in 24 months  Cessation Counseling or  Active cessation medication Past 24 months  Past 24 months   Guideline developer: UpToDate (See UpToDate for funding source) Date Released: 2014       Wellness Office Visit  Subjective:  Patient ID: Heather Burnett, female    DOB: 05-20-1971  Age: 49 y.o. MRN: [id number]  CC: This is an audio telemedicine visit with the patient who is at home and I am in my office.  She was unable to connect through video.  I was able to easily recognize her voice. The follow-up is for her multiple medical problems including obesity, hypertension, PCOS. HPI  I have not seen her since August 2020.  She has had foot problems and is due to have surgery but she had to be cleared by cardiology and hopefully she can have the surgery at the end of this month. She continues on her chronic medications as before which are listed.  She requires a refill of the NP thyroid. She has no other specific complaints at the present time. Past Medical History:  Diagnosis Date  . Allergy   . Anxiety   . Asthma    WELL CONTROLLED  . Cancer ([place])    melanoma on my chest  . COPD (chronic obstructive pulmonary disease) ([place])   . DDD (degenerative disc disease), cervical    and lower back  . Depression   . DJD (degenerative joint disease)   . Dysrhythmia   . Empty sella syndrome ([place])    followed at [facility]  . Family history of adverse reaction to anesthesia    mother almost died with anesthesia in the past, hard to wake up and got real sick on my stomach vomiting, now she receives local anethesia.  Marland Kitchen GERD (gastroesophageal reflux disease)   . Headache    history of migraine  . Heart murmur   . History of methicillin resistant staphylococcus aureus (MRSA)   . History of palpitations 07/2004  . Hyperlipidemia   . Hypertension   . Hypothyroidism   . Lymphocytosis   . Neuropathy  2016  . PCOS (polycystic ovarian syndrome)   . Pituitary microadenoma ([place])    followed at [facility]  . Pneumonia 2008  . Pre-diabetes   . Sleep apnea    USES CPAP  . Thyroid disease   . Von Willebrand disease ([place]) 2012   PT STATES SHE HAS NEVER HAD ANY ISSUES WITH BLEEDING      Family History  Problem Relation Age of Onset  . Diabetes Father   . Hyperlipidemia Father   . Hypertension Father   . Cancer Father   . Heart attack Father   . Breast cancer Sister 79  . Breast cancer Maternal Aunt 54  . Breast cancer Maternal Grandmother 79    Social History   Social History Narrative  . Not on file   Social History   Tobacco Use  . Smoking status: Former Smoker    Years: 8.00    Types: Cigarettes    Quit date: 04/09/1994    Years since quitting: 25.6  . Smokeless tobacco: Never Used  Substance Use Topics  . Alcohol use: No    Alcohol/week: 0.0 standard drinks    Current Meds  Medication Sig  . acetaminophen (TYLENOL) 500 MG tablet Take 500 mg by mouth every 6 (six) hours as needed.  Marland Kitchen albuterol (VENTOLIN HFA) 108 (90  Base) MCG/ACT inhaler Inhale 2 puffs into the lungs every 6 (six) hours as needed for wheezing or shortness of breath.  Marland Kitchen atorvastatin (LIPITOR) 10 MG tablet Take 10 mg by mouth daily at 6 PM.   . cetirizine (ZYRTEC) 10 MG tablet Take 10 mg by mouth daily as needed (seasonal allergies.).   Marland Kitchen Cholecalciferol (VITAMIN D-3) 125 MCG (5000 UT) TABS Take 5,000 Units by mouth daily.  . citalopram (CELEXA) 40 MG tablet Take 20 mg by mouth every morning.   . clindamycin (CLINDAGEL) 1 % gel Apply 1 application topically daily as needed (acne).   Marland Kitchen doxycycline (VIBRAMYCIN) 100 MG capsule Take 100 mg by mouth 2 (two) times daily as needed (acne flare ups).   . DULoxetine (CYMBALTA) 30 MG capsule Take 30 mg by mouth daily in the afternoon.  . DULoxetine (CYMBALTA) 60 MG capsule Take 60 mg by mouth daily.  . Fluticasone-Salmeterol (ADVAIR DISKUS) 250-50 MCG/DOSE AEPB  Inhale 1 puff into the lungs every 12 (twelve) hours as needed (seasonal allergies).   . furosemide (LASIX) 40 MG tablet Take 40 mg by mouth daily in the afternoon.   . hydrOXYzine (ATARAX/VISTARIL) 25 MG tablet Take 25 mg by mouth at bedtime.  . lamoTRIgine (LAMICTAL) 200 MG tablet Take 200 mg by mouth every morning.   . metFORMIN (GLUCOPHAGE) 1000 MG tablet Take 1,000 mg by mouth 2 (two) times daily with a meal. Lunch & supper  . metoprolol succinate (TOPROL XL) 50 MG 24 hr tablet Take 1 tablet (50 mg total) by mouth 2 (two) times daily. Take with or immediately following a meal.  . montelukast (SINGULAIR) 10 MG tablet Take 10 mg by mouth at bedtime as needed.  . NP THYROID 90 MG tablet Take 1 tablet (90 mg total) by mouth every evening.  . Omeprazole Magnesium (PRILOSEC OTC PO) Take 1 tablet by mouth as needed.  . progesterone (PROMETRIUM) 200 MG capsule TAKE 2 CAPSULES BY MOUTH EVERY NIGHT (Patient taking differently: Take 200 mg by mouth at bedtime. )  . [DISCONTINUED] acetaminophen (TYLENOL) 325 MG tablet Take 650 mg by mouth every 6 (six) hours as needed.  . [DISCONTINUED] NP THYROID 90 MG tablet Take 90 mg by mouth every evening.      Objective:   Today's Vitals: There were no vitals taken for this visit. Vitals with BMI 11/30/2019 11/17/2019 11/15/2019  Height (No Data) 5\' 2"  5\' 2"   Weight (No Data) 246 lbs 231 lbs 1 oz  BMI - [id number]  Systolic (No Data) [id number] -  Diastolic (No Data) 74 -  Pulse - 76 -  Some encounter information is confidential and restricted. Go to Review Flowsheets activity to see all data.     Physical Exam   She appears alert and orientated on the phone and her speech was clear.    Assessment   1. Benign essential HTN   2. Class 3 severe obesity due to excess calories with serious comorbidity and body mass index (BMI) of 40.0 to 44.9 in adult [facility])       Tests ordered No orders of the defined types were placed in this  encounter.    Plan: 1. I have refilled her NP thyroid today and we will see her in the next couple of months in the office for follow-up and we will do blood work then.  By this time she will of hopefully made a reasonable recovery from her foot surgery. 2. This phone call lasted 6 minutes and 12  seconds.   Meds ordered this encounter  Medications  . NP THYROID 90 MG tablet    Sig: Take 1 tablet (90 mg total) by mouth every evening.    Dispense:  30 tablet    Refill:  2    Naythan Douthit Luther Parody, MD

## 2019-12-05 MED FILL — metFORMIN HCL 1000 MG TABS: 1000 | 90 days supply | Qty: 180 | Fill #2

## 2019-12-09 MED FILL — PROGESTERONE 200 MG CAPSULE: 200 | 30 days supply | Qty: 60 | Fill #1

## 2019-12-13 ENCOUNTER — Other Ambulatory Visit
Admission: RE | Admit: 2019-12-13 | Discharge: 2019-12-13 | Disposition: A | Discharge status: Home / Self Care | Payer: No Typology Code available for payment source | Source: Ambulatory Visit | Attending: Podiatry | Admitting: Podiatry

## 2019-12-13 DIAGNOSIS — Z20822 Contact with and (suspected) exposure to covid-19: Secondary | ICD-10-CM | POA: Diagnosis not present

## 2019-12-13 DIAGNOSIS — Z01812 Encounter for preprocedural laboratory examination: Secondary | ICD-10-CM | POA: Diagnosis present

## 2019-12-13 LAB — SARS CORONAVIRUS 2 (TAT 6-24 HRS): SARS Coronavirus 2: NEGATIVE

## 2019-12-15 ENCOUNTER — Encounter: Payer: Self-pay | Admitting: Podiatry

## 2019-12-15 ENCOUNTER — Other Ambulatory Visit: Payer: Self-pay

## 2019-12-15 ENCOUNTER — Ambulatory Visit
Admission: RE | Admit: 2019-12-15 | Discharge: 2019-12-15 | Disposition: A | Discharge status: Home / Self Care | Payer: No Typology Code available for payment source | Attending: Podiatry | Admitting: Podiatry

## 2019-12-15 ENCOUNTER — Encounter
Admission: RE | Disposition: A | Discharge status: Home / Self Care | Payer: Self-pay | Source: Home / Self Care | Attending: Podiatry

## 2019-12-15 ENCOUNTER — Ambulatory Visit: Payer: No Typology Code available for payment source | Admitting: Anesthesiology

## 2019-12-15 DIAGNOSIS — Z87891 Personal history of nicotine dependence: Secondary | ICD-10-CM | POA: Insufficient documentation

## 2019-12-15 DIAGNOSIS — G4730 Sleep apnea, unspecified: Secondary | ICD-10-CM | POA: Diagnosis not present

## 2019-12-15 DIAGNOSIS — Z83511 Family history of glaucoma: Secondary | ICD-10-CM | POA: Insufficient documentation

## 2019-12-15 DIAGNOSIS — J449 Chronic obstructive pulmonary disease, unspecified: Secondary | ICD-10-CM | POA: Diagnosis not present

## 2019-12-15 DIAGNOSIS — D680 Von Willebrand's disease: Secondary | ICD-10-CM | POA: Diagnosis not present

## 2019-12-15 DIAGNOSIS — Z7984 Long term (current) use of oral hypoglycemic drugs: Secondary | ICD-10-CM | POA: Diagnosis not present

## 2019-12-15 DIAGNOSIS — R0600 Dyspnea, unspecified: Secondary | ICD-10-CM | POA: Insufficient documentation

## 2019-12-15 DIAGNOSIS — Z8249 Family history of ischemic heart disease and other diseases of the circulatory system: Secondary | ICD-10-CM | POA: Insufficient documentation

## 2019-12-15 DIAGNOSIS — G43909 Migraine, unspecified, not intractable, without status migrainosus: Secondary | ICD-10-CM | POA: Diagnosis not present

## 2019-12-15 DIAGNOSIS — I10 Essential (primary) hypertension: Secondary | ICD-10-CM | POA: Diagnosis not present

## 2019-12-15 DIAGNOSIS — Q6602 Congenital talipes equinovarus, left foot: Secondary | ICD-10-CM | POA: Insufficient documentation

## 2019-12-15 DIAGNOSIS — F419 Anxiety disorder, unspecified: Secondary | ICD-10-CM | POA: Insufficient documentation

## 2019-12-15 DIAGNOSIS — Z8349 Family history of other endocrine, nutritional and metabolic diseases: Secondary | ICD-10-CM | POA: Insufficient documentation

## 2019-12-15 DIAGNOSIS — Z8041 Family history of malignant neoplasm of ovary: Secondary | ICD-10-CM | POA: Insufficient documentation

## 2019-12-15 DIAGNOSIS — S86012A Strain of left Achilles tendon, initial encounter: Secondary | ICD-10-CM | POA: Insufficient documentation

## 2019-12-15 DIAGNOSIS — Z823 Family history of stroke: Secondary | ICD-10-CM | POA: Insufficient documentation

## 2019-12-15 DIAGNOSIS — Z800 Family history of malignant neoplasm of digestive organs: Secondary | ICD-10-CM | POA: Insufficient documentation

## 2019-12-15 DIAGNOSIS — E282 Polycystic ovarian syndrome: Secondary | ICD-10-CM | POA: Diagnosis not present

## 2019-12-15 DIAGNOSIS — E559 Vitamin D deficiency, unspecified: Secondary | ICD-10-CM | POA: Diagnosis not present

## 2019-12-15 DIAGNOSIS — G609 Hereditary and idiopathic neuropathy, unspecified: Secondary | ICD-10-CM | POA: Diagnosis not present

## 2019-12-15 DIAGNOSIS — Z79899 Other long term (current) drug therapy: Secondary | ICD-10-CM | POA: Insufficient documentation

## 2019-12-15 DIAGNOSIS — Z888 Allergy status to other drugs, medicaments and biological substances status: Secondary | ICD-10-CM | POA: Insufficient documentation

## 2019-12-15 DIAGNOSIS — M7662 Achilles tendinitis, left leg: Secondary | ICD-10-CM | POA: Diagnosis present

## 2019-12-15 DIAGNOSIS — E785 Hyperlipidemia, unspecified: Secondary | ICD-10-CM | POA: Diagnosis not present

## 2019-12-15 DIAGNOSIS — K219 Gastro-esophageal reflux disease without esophagitis: Secondary | ICD-10-CM | POA: Insufficient documentation

## 2019-12-15 DIAGNOSIS — D689 Coagulation defect, unspecified: Secondary | ICD-10-CM | POA: Diagnosis not present

## 2019-12-15 DIAGNOSIS — Z9049 Acquired absence of other specified parts of digestive tract: Secondary | ICD-10-CM | POA: Insufficient documentation

## 2019-12-15 DIAGNOSIS — Z880 Allergy status to penicillin: Secondary | ICD-10-CM | POA: Diagnosis not present

## 2019-12-15 DIAGNOSIS — R002 Palpitations: Secondary | ICD-10-CM | POA: Insufficient documentation

## 2019-12-15 DIAGNOSIS — E118 Type 2 diabetes mellitus with unspecified complications: Secondary | ICD-10-CM | POA: Insufficient documentation

## 2019-12-15 DIAGNOSIS — Z833 Family history of diabetes mellitus: Secondary | ICD-10-CM | POA: Insufficient documentation

## 2019-12-15 DIAGNOSIS — Z91048 Other nonmedicinal substance allergy status: Secondary | ICD-10-CM | POA: Insufficient documentation

## 2019-12-15 DIAGNOSIS — X58XXXA Exposure to other specified factors, initial encounter: Secondary | ICD-10-CM | POA: Diagnosis not present

## 2019-12-15 DIAGNOSIS — R011 Cardiac murmur, unspecified: Secondary | ICD-10-CM | POA: Insufficient documentation

## 2019-12-15 DIAGNOSIS — Z7982 Long term (current) use of aspirin: Secondary | ICD-10-CM | POA: Diagnosis not present

## 2019-12-15 DIAGNOSIS — Z803 Family history of malignant neoplasm of breast: Secondary | ICD-10-CM | POA: Insufficient documentation

## 2019-12-15 DIAGNOSIS — Z825 Family history of asthma and other chronic lower respiratory diseases: Secondary | ICD-10-CM | POA: Insufficient documentation

## 2019-12-15 DIAGNOSIS — Z91040 Latex allergy status: Secondary | ICD-10-CM | POA: Insufficient documentation

## 2019-12-15 DIAGNOSIS — D72820 Lymphocytosis (symptomatic): Secondary | ICD-10-CM | POA: Insufficient documentation

## 2019-12-15 DIAGNOSIS — Z8049 Family history of malignant neoplasm of other genital organs: Secondary | ICD-10-CM | POA: Insufficient documentation

## 2019-12-15 HISTORY — PX: GASTROC RECESSION EXTREMITY: SHX6262

## 2019-12-15 HISTORY — PX: ACHILLES TENDON SURGERY: SHX542

## 2019-12-15 LAB — GLUCOSE, CAPILLARY
Glucose-Capillary: 134 mg/dL — ABNORMAL HIGH (ref 70–99)
Glucose-Capillary: 115 mg/dL — ABNORMAL HIGH (ref 70–99)

## 2019-12-15 LAB — POCT PREGNANCY, URINE: Preg Test, Ur: NEGATIVE

## 2019-12-15 SURGERY — RECESSION, TENDON, GASTROCNEMIUS
Anesthesia: General | Laterality: Left

## 2019-12-15 MED ORDER — BUPIVACAINE HCL (PF) 0.25 % IJ SOLN
INTRAMUSCULAR | Status: AC
  Filled 2019-12-15: qty 30

## 2019-12-15 MED ORDER — ROCURONIUM BROMIDE 100 MG/10ML IV SOLN
INTRAVENOUS | Status: DC | PRN
  Administered 2019-12-15: 50 mg via INTRAVENOUS

## 2019-12-15 MED ORDER — FENTANYL CITRATE (PF) 100 MCG/2ML IJ SOLN
INTRAMUSCULAR | Status: DC | PRN
  Administered 2019-12-15: 100 ug via INTRAVENOUS

## 2019-12-15 MED ORDER — OXYCODONE-ACETAMINOPHEN 5-325 MG PO TABS: 1.0000 | ORAL_TABLET | ORAL | 0 refills | Status: DC | PRN

## 2019-12-15 MED ORDER — CLINDAMYCIN PHOSPHATE 900 MG/50ML IV SOLN
900.0000 mg | INTRAVENOUS | Status: AC
  Administered 2019-12-15: 900 mg via INTRAVENOUS

## 2019-12-15 MED ORDER — PHENYLEPHRINE HCL (PRESSORS) 10 MG/ML IV SOLN
INTRAVENOUS | Status: DC | PRN
  Administered 2019-12-15: 200 ug via INTRAVENOUS
  Administered 2019-12-15 (×4): 100 ug via INTRAVENOUS

## 2019-12-15 MED ORDER — DEXAMETHASONE SODIUM PHOSPHATE 10 MG/ML IJ SOLN
INTRAMUSCULAR | Status: DC | PRN
  Administered 2019-12-15: 4 mg via INTRAVENOUS

## 2019-12-15 MED ORDER — MIDAZOLAM HCL 2 MG/2ML IJ SOLN
INTRAMUSCULAR | Status: DC | PRN
  Administered 2019-12-15: 2 mg via INTRAVENOUS

## 2019-12-15 MED ORDER — CLINDAMYCIN PHOSPHATE 900 MG/50ML IV SOLN
INTRAVENOUS | Status: AC
  Filled 2019-12-15: qty 50

## 2019-12-15 MED ORDER — KETOROLAC TROMETHAMINE 30 MG/ML IJ SOLN
INTRAMUSCULAR | Status: DC | PRN
  Administered 2019-12-15: 30 mg via INTRAVENOUS

## 2019-12-15 MED ORDER — PROPOFOL 10 MG/ML IV BOLUS
INTRAVENOUS | Status: AC
  Filled 2019-12-15: qty 20

## 2019-12-15 MED ORDER — OXYCODONE-ACETAMINOPHEN 5-325 MG PO TABS
1.0000 | ORAL_TABLET | Freq: Once | ORAL | Status: AC
  Administered 2019-12-15: 1 via ORAL

## 2019-12-15 MED ORDER — ONDANSETRON HCL 4 MG/2ML IJ SOLN
INTRAMUSCULAR | Status: DC | PRN
  Administered 2019-12-15: 4 mg via INTRAVENOUS

## 2019-12-15 MED ORDER — ACETAMINOPHEN 10 MG/ML IV SOLN
INTRAVENOUS | Status: DC | PRN
  Administered 2019-12-15: 1000 mg via INTRAVENOUS

## 2019-12-15 MED ORDER — FENTANYL CITRATE (PF) 100 MCG/2ML IJ SOLN
INTRAMUSCULAR | Status: AC
  Filled 2019-12-15: qty 2

## 2019-12-15 MED ORDER — BUPIVACAINE-EPINEPHRINE (PF) 0.25% -1:200000 IJ SOLN
INTRAMUSCULAR | Status: DC | PRN
  Administered 2019-12-15: 10 mL

## 2019-12-15 MED ORDER — BUPIVACAINE LIPOSOME 1.3 % IJ SUSP
INTRAMUSCULAR | Status: AC
  Filled 2019-12-15: qty 20

## 2019-12-15 MED ORDER — FENTANYL CITRATE (PF) 100 MCG/2ML IJ SOLN: 25.0000 ug | INTRAMUSCULAR | Status: DC | PRN

## 2019-12-15 MED ORDER — EPINEPHRINE PF 1 MG/ML IJ SOLN
INTRAMUSCULAR | Status: AC
  Filled 2019-12-15: qty 1

## 2019-12-15 MED ORDER — ONDANSETRON HCL 4 MG/2ML IJ SOLN: 4.0000 mg | Freq: Once | INTRAMUSCULAR | Status: DC | PRN

## 2019-12-15 MED ORDER — SODIUM CHLORIDE 0.9 % IV SOLN
INTRAVENOUS | Status: DC | PRN
  Administered 2019-12-15: 50 ug/min via INTRAVENOUS

## 2019-12-15 MED ORDER — LIDOCAINE HCL (CARDIAC) PF 100 MG/5ML IV SOSY
PREFILLED_SYRINGE | INTRAVENOUS | Status: DC | PRN
  Administered 2019-12-15: 100 mg via INTRAVENOUS

## 2019-12-15 MED ORDER — OXYCODONE-ACETAMINOPHEN 5-325 MG PO TABS
ORAL_TABLET | ORAL | Status: AC
  Filled 2019-12-15: qty 1

## 2019-12-15 MED ORDER — LIDOCAINE HCL (PF) 1 % IJ SOLN
INTRAMUSCULAR | Status: AC
  Filled 2019-12-15: qty 30

## 2019-12-15 MED ORDER — POVIDONE-IODINE 7.5 % EX SOLN
Freq: Once | CUTANEOUS | Status: AC
  Filled 2019-12-15: qty 118

## 2019-12-15 MED ORDER — LIDOCAINE-EPINEPHRINE 1 %-1:100000 IJ SOLN
INTRAMUSCULAR | Status: AC
  Filled 2019-12-15: qty 1

## 2019-12-15 MED ORDER — MIDAZOLAM HCL 2 MG/2ML IJ SOLN
INTRAMUSCULAR | Status: AC
  Filled 2019-12-15: qty 2

## 2019-12-15 MED ORDER — LACTATED RINGERS IV SOLN: INTRAVENOUS | Status: DC

## 2019-12-15 MED ORDER — BUPIVACAINE LIPOSOME 1.3 % IJ SUSP
INTRAMUSCULAR | Status: DC | PRN
  Administered 2019-12-15: 20 mL

## 2019-12-15 MED ORDER — LACTATED RINGERS IV SOLN: INTRAVENOUS | Status: DC | PRN

## 2019-12-15 MED ORDER — SUGAMMADEX SODIUM 500 MG/5ML IV SOLN
INTRAVENOUS | Status: DC | PRN
  Administered 2019-12-15: 200 mg via INTRAVENOUS

## 2019-12-15 MED ORDER — SODIUM CHLORIDE 0.9 % IV SOLN: INTRAVENOUS | Status: DC

## 2019-12-15 MED ORDER — PROPOFOL 10 MG/ML IV BOLUS
INTRAVENOUS | Status: DC | PRN
  Administered 2019-12-15: 200 mg via INTRAVENOUS

## 2019-12-15 MED ORDER — BUPIVACAINE-EPINEPHRINE (PF) 0.5% -1:200000 IJ SOLN
INTRAMUSCULAR | Status: AC
  Filled 2019-12-15: qty 30

## 2019-12-15 MED ORDER — BUPIVACAINE HCL (PF) 0.5 % IJ SOLN
INTRAMUSCULAR | Status: AC
  Filled 2019-12-15: qty 30

## 2019-12-15 MED FILL — OXYCODONE-APAP 5-325MG: 5-325 | 4 days supply | Qty: 20 | Fill #0

## 2019-12-15 SURGICAL SUPPLY — 74 items
BLADE MED AGGRESSIVE (BLADE) IMPLANT
BLADE OSCILLATING/SAGITTAL (BLADE)
BLADE SURG 15 STRL LF DISP TIS (BLADE) ×2 IMPLANT
BLADE SURG 15 STRL SS (BLADE) ×2
BLADE SURG MINI STRL (BLADE) ×2 IMPLANT
BLADE SW THK.38XMED LNG THN (BLADE) IMPLANT
BNDG COHESIVE 4X5 TAN STRL (GAUZE/BANDAGES/DRESSINGS) ×2 IMPLANT
BNDG CONFORM 2 STRL LF (GAUZE/BANDAGES/DRESSINGS) ×2 IMPLANT
BNDG CONFORM 3 STRL LF (GAUZE/BANDAGES/DRESSINGS) ×2 IMPLANT
BNDG ELASTIC 4X5.8 VLCR NS LF (GAUZE/BANDAGES/DRESSINGS) ×4 IMPLANT
BNDG ESMARK 4X12 TAN STRL LF (GAUZE/BANDAGES/DRESSINGS) ×2 IMPLANT
BNDG ESMARK 6X12 TAN STRL LF (GAUZE/BANDAGES/DRESSINGS) ×2 IMPLANT
BNDG GAUZE 4.5X4.1 6PLY STRL (MISCELLANEOUS) ×2 IMPLANT
BUR 4X55 1 (BURR) IMPLANT
CANISTER SUCT 1200ML W/VALVE (MISCELLANEOUS) ×2 IMPLANT
COVER WAND RF STERILE (DRAPES) ×2 IMPLANT
CUFF TOURN SGL QUICK 18X4 (TOURNIQUET CUFF) IMPLANT
CUFF TOURN SGL QUICK 24 (TOURNIQUET CUFF)
CUFF TRNQT CYL 24X4X16.5-23 (TOURNIQUET CUFF) IMPLANT
DRAPE FLUOR MINI C-ARM 54X84 (DRAPES) IMPLANT
DRSG TEGADERM 4X4.75 (GAUZE/BANDAGES/DRESSINGS) ×2 IMPLANT
DURAPREP 26ML APPLICATOR (WOUND CARE) ×2 IMPLANT
ELECT REM PT RETURN 9FT ADLT (ELECTROSURGICAL) ×2
ELECTRODE REM PT RTRN 9FT ADLT (ELECTROSURGICAL) ×1 IMPLANT
GAUZE SPONGE 4X4 12PLY STRL (GAUZE/BANDAGES/DRESSINGS) ×2 IMPLANT
GAUZE XEROFORM 1X8 LF (GAUZE/BANDAGES/DRESSINGS) ×2 IMPLANT
GLOVE BIO SURGEON STRL SZ7.5 (GLOVE) ×2 IMPLANT
GLOVE INDICATOR 8.0 STRL GRN (GLOVE) ×2 IMPLANT
GOWN STRL REUS W/ TWL LRG LVL3 (GOWN DISPOSABLE) ×2 IMPLANT
GOWN STRL REUS W/TWL LRG LVL3 (GOWN DISPOSABLE) ×2
HANDLE YANKAUER SUCT BULB TIP (MISCELLANEOUS) ×2 IMPLANT
KIT SHOULDER TRACTION (DRAPES) IMPLANT
KIT TURNOVER KIT A (KITS) ×2 IMPLANT
LABEL OR SOLS (LABEL) ×2 IMPLANT
NDL MAYO CATGUT SZ5 (NEEDLE)
NDL SAFETY ECLIPSE 18X1.5 (NEEDLE) ×1 IMPLANT
NDL SUT 5 .5 CRC TPR PNT MAYO (NEEDLE) IMPLANT
NEEDLE FILTER BLUNT 18X 1/2SAF (NEEDLE) ×1
NEEDLE FILTER BLUNT 18X1 1/2 (NEEDLE) ×1 IMPLANT
NEEDLE HYPO 18GX1.5 SHARP (NEEDLE) ×1
NEEDLE HYPO 25X1 1.5 SAFETY (NEEDLE) ×6 IMPLANT
NS IRRIG 500ML POUR BTL (IV SOLUTION) ×2 IMPLANT
PACK EXTREMITY ARMC (MISCELLANEOUS) ×2 IMPLANT
PAD CAST CTTN 4X4 STRL (SOFTGOODS) ×1 IMPLANT
PADDING CAST BLEND 4X4 NS (MISCELLANEOUS) ×2 IMPLANT
PADDING CAST COTTON 4X4 STRL (SOFTGOODS) ×1
PENCIL ELECTRO HAND CTR (MISCELLANEOUS) ×2 IMPLANT
RASP SM TEAR CROSS CUT (RASP) IMPLANT
SPLINT CAST 1 STEP 5X30 WHT (MISCELLANEOUS) ×2 IMPLANT
SPLINT FAST PLASTER 5X30 (CAST SUPPLIES)
SPLINT PLASTER CAST FAST 5X30 (CAST SUPPLIES) IMPLANT
SPONGE LAP 18X18 RF (DISPOSABLE) ×2 IMPLANT
STOCKINETTE M/LG 89821 (MISCELLANEOUS) ×2 IMPLANT
STRIP CLOSURE SKIN 1/2X4 (GAUZE/BANDAGES/DRESSINGS) ×2 IMPLANT
STRIP CLOSURE SKIN 1/4X4 (GAUZE/BANDAGES/DRESSINGS) ×2 IMPLANT
SUT ETHILON 3-0 (SUTURE) IMPLANT
SUT MNCRL AB 4-0 PS2 18 (SUTURE) ×4 IMPLANT
SUT MNCRL+ 5-0 UNDYED PC-3 (SUTURE) IMPLANT
SUT MNCRL+ 5-0 VIOLET P-3 (SUTURE) IMPLANT
SUT MONOCRYL 5-0 (SUTURE)
SUT PDS AB 0 CT1 27 (SUTURE) IMPLANT
SUT VIC AB 0 SH 27 (SUTURE) IMPLANT
SUT VIC AB 2-0 SH 27 (SUTURE)
SUT VIC AB 2-0 SH 27XBRD (SUTURE) IMPLANT
SUT VIC AB 3-0 SH 27 (SUTURE) ×3
SUT VIC AB 3-0 SH 27X BRD (SUTURE) ×3 IMPLANT
SUT VIC AB 4-0 FS2 27 (SUTURE) ×2 IMPLANT
SUT VICRYL AB 3-0 FS1 BRD 27IN (SUTURE) IMPLANT
SWABSTK COMLB BENZOIN TINCTURE (MISCELLANEOUS) ×2 IMPLANT
SYR 10ML LL (SYRINGE) ×4 IMPLANT
SYR 3ML LL SCALE MARK (SYRINGE) ×2 IMPLANT
TRAY FOLEY MTR SLVR 16FR STAT (SET/KITS/TRAYS/PACK) ×2 IMPLANT
WAND TOPAZ MICRO DEBRIDER (MISCELLANEOUS) ×2 IMPLANT
WIRE MAGNUM (SUTURE) IMPLANT

## 2019-12-15 NOTE — Anesthesia Procedure Notes (Signed)
Procedure Name: Intubation Date/Time: 12/15/2019 7:30 AM Performed by: Willette [place], CRNA Pre-anesthesia Checklist: Patient identified, Patient being monitored, Timeout performed, Emergency Drugs available and Suction available Patient Re-evaluated:Patient Re-evaluated prior to induction Oxygen Delivery Method: Circle system utilized Preoxygenation: Pre-oxygenation with 100% oxygen Induction Type: IV induction Ventilation: Mask ventilation without difficulty Laryngoscope Size: McGraph and 4 Grade View: Grade I Tube type: Oral Tube size: 7.0 mm Number of attempts: 1 Airway Equipment and Method: Stylet Placement Confirmation: ETT inserted through vocal cords under direct vision,  positive ETCO2 and breath sounds checked- equal and bilateral Secured at: 21 cm Tube secured with: Tape Dental Injury: Teeth and Oropharynx as per pre-operative assessment

## 2019-12-15 NOTE — Transfer of Care (Signed)
Immediate Anesthesia Transfer of Care Note  Patient: Heather Burnett  Procedure(s) Performed: GASTROC RECESSION EXTREMITY (Left ) ACHILLES TENDON REPAIR PRIMARY (Left )  Patient Location: PACU  Anesthesia Type:General  Level of Consciousness: awake, alert  and oriented  Airway & Oxygen Therapy: Patient Spontanous Breathing and Patient connected to face mask oxygen  Post-op Assessment: Report given to RN and Post -op Vital signs reviewed and stable  Post vital signs: Reviewed and stable  Last Vitals:  Vitals Value Taken Time  BP 116/79 12/15/19 0859  Temp    Pulse 85 12/15/19 0901  Resp    SpO2 100 % 12/15/19 0901  Vitals shown include unvalidated device data.  Last Pain:  Vitals:   12/15/19 0624  TempSrc: Oral         Complications: No apparent anesthesia complications

## 2019-12-15 NOTE — H&P (Signed)
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  The various methods of treatment have been discussed with the patient.  No guarantees were given.  After consideration of risks, benefits and other options for treatment, the patient has consented to surgery.  I have reviewed the patients' chart and labs.    Lungs:  CTA CV: RRR.  Pt has obtained cardiac clearance.  A history and physical examination was performed in my office.  The patient was reexamined.  There have been no changes to this history and physical examination.  Samara Deist A

## 2019-12-15 NOTE — Anesthesia Postprocedure Evaluation (Signed)
Anesthesia Post Note  Patient: Heather Burnett  Procedure(s) Performed: GASTROC RECESSION EXTREMITY (Left ) ACHILLES TENDON REPAIR PRIMARY (Left )  Patient location during evaluation: PACU Anesthesia Type: General Level of consciousness: awake and alert Pain management: pain level controlled Vital Signs Assessment: post-procedure vital signs reviewed and stable Respiratory status: spontaneous breathing, nonlabored ventilation, respiratory function stable and patient connected to nasal cannula oxygen Cardiovascular status: blood pressure returned to baseline and stable Postop Assessment: no apparent nausea or vomiting Anesthetic complications: no     Last Vitals:  Vitals:   12/15/19 0958 12/15/19 1039  BP: (!) 115/55 122/64  Pulse: 74 80  Resp: 18 20  Temp: (!) 36.1 C   SpO2: 96% 99%    Last Pain:  Vitals:   12/15/19 0958  TempSrc: Temporal  PainSc: 0-No pain                 Molli Barrows

## 2019-12-15 NOTE — Anesthesia Preprocedure Evaluation (Signed)
Anesthesia Evaluation  Patient identified by MRN, date of birth, ID band Patient awake    Reviewed: Allergy & Precautions, H&P , NPO status , Patient's Chart, lab work & pertinent test results, reviewed documented beta blocker date and time   Airway Mallampati: II  TM Distance: >3 FB Neck ROM: full    Dental  (+) Teeth Intact   Pulmonary neg pulmonary ROS, asthma , sleep apnea and Continuous Positive Airway Pressure Ventilation , pneumonia, COPD, former smoker,    Pulmonary exam normal        Cardiovascular Exercise Tolerance: Good hypertension, On Medications negative cardio ROS Normal cardiovascular exam+ dysrhythmias + Valvular Problems/Murmurs  Rhythm:regular Rate:Normal     Neuro/Psych  Headaches, PSYCHIATRIC DISORDERS Anxiety Depression negative neurological ROS  negative psych ROS   GI/Hepatic negative GI ROS, Neg liver ROS, GERD  ,  Endo/Other  negative endocrine ROSHypothyroidism   Renal/GU negative Renal ROS  negative genitourinary   Musculoskeletal   Abdominal   Peds  Hematology negative hematology ROS (+)   Anesthesia Other Findings Past Medical History: No date: Allergy No date: Anxiety No date: Asthma     Comment:  WELL CONTROLLED No date: Cancer ([place])     Comment:  melanoma on my chest No date: COPD (chronic obstructive pulmonary disease) (HCC) No date: DDD (degenerative disc disease), cervical     Comment:  and lower back No date: Depression No date: DJD (degenerative joint disease) No date: Dysrhythmia No date: Empty sella syndrome (HCC)     Comment:  followed at [facility] No date: Family history of adverse reaction to anesthesia     Comment:  mother almost died with anesthesia in the past, hard to               wake up and got real sick on my stomach vomiting, now she              receives local anethesia. No date: GERD (gastroesophageal reflux disease) No date: Headache     Comment:  history  of migraine No date: Heart murmur No date: History of methicillin resistant staphylococcus aureus (MRSA) 07/2004: History of palpitations No date: Hyperlipidemia No date: Hypertension No date: Hypothyroidism No date: Lymphocytosis 2016: Neuropathy No date: PCOS (polycystic ovarian syndrome) No date: Pituitary microadenoma [facility])     Comment:  followed at [facility] 2008: Pneumonia No date: Pre-diabetes No date: Sleep apnea     Comment:  USES CPAP No date: Thyroid disease 2012: Von Willebrand disease ([place])     Comment:  PT STATES SHE HAS NEVER HAD ANY ISSUES WITH BLEEDING Past Surgical History: No date: BACK SURGERY     Comment:  removal of 2 lumbar vertebrae 2010: BREAST BIOPSY; Right     Comment:  NEG No date: CHOLECYSTECTOMY 03/03/2013, 02/06/2008: COLONOSCOPY 08/05/2018: COLONOSCOPY WITH PROPOFOL; N/A     Comment:  Procedure: COLONOSCOPY WITH PROPOFOL;  Surgeon: Manya Silvas, MD;  Location: [facility] ENDOSCOPY;  Service:               Endoscopy;  Laterality: N/A; 02/06/2008: EGD 08/21/2016: HALLUX VALGUS CHEILECTOMY; Left     Comment:  Procedure: HALLUX VALGUS CHEILECTOMY;  Surgeon: Sharlotte Alamo, DPM;  Location: [facility] ORS;  Service: Podiatry;                Laterality: Left; 10/2014: HEMORRHOID  SURGERY No date: SPINE SURGERY No date: TONSILLECTOMY AND ADENOIDECTOMY   Reproductive/Obstetrics negative OB ROS                             Anesthesia Physical Anesthesia Plan  ASA: III  Anesthesia Plan: General ETT   Post-op Pain Management:    Induction:   PONV Risk Score and Plan: 3  Airway Management Planned:   Additional Equipment:   Intra-op Plan:   Post-operative Plan:   Informed Consent: I have reviewed the patients History and Physical, chart, labs and discussed the procedure including the risks, benefits and alternatives for the proposed anesthesia with the patient or authorized representative who has indicated  his/her understanding and acceptance.     Dental Advisory Given  Plan Discussed with: CRNA  Anesthesia Plan Comments:         Anesthesia Quick Evaluation

## 2019-12-15 NOTE — Discharge Instructions (Addendum)
[place] DR. TROXLER, DR. Vickki Muff, AND [facility]   PATIENT IS NON WEIGHT BEARING PER DR. Vickki Muff   1. Take your medication as prescribed.  Pain medication should be taken only as needed.  2. Keep the dressing clean, dry and intact.  3. Keep your foot elevated above the heart level for the first 48 hours.  4. Walking to the bathroom and brief periods of walking are acceptable, unless we have instructed you to be non-weight bearing.  5. Always wear your post-op shoe when walking.  Always use your crutches if you are to be non-weight bearing.  6. Do not take a shower. Baths are permissible as long as the foot is kept out of the water.   7. Every hour you are awake:  - Bend your knee 15 times.  8. Call [facility] [phone]) if any of the following problems occur: - You develop a temperature or fever. - The bandage becomes saturated with blood. - Medication does not stop your pain. - Injury of the foot occurs. - Any symptoms of infection including redness, odor, or red streaks running from wound.     AMBULATORY SURGERY  DISCHARGE INSTRUCTIONS   1) The drugs that you were given will stay in your system until tomorrow so for the next 24 hours you should not:  A) Drive an automobile B) Make any legal decisions C) Drink any alcoholic beverage   2) You may resume regular meals tomorrow.  Today it is better to start with liquids and gradually work up to solid foods.  You may eat anything you prefer, but it is better to start with liquids, then soup and crackers, and gradually work up to solid foods.   3) Please notify your doctor immediately if you have any unusual bleeding, trouble breathing, redness and pain at the surgery site, drainage, fever, or pain not relieved by medication.    4) Additional Instructions:        Please contact your  physician with any problems or Same Day Surgery at [phone], Monday through Friday 6 am to 4 pm, or [place] at [facility] number at [phone].

## 2019-12-15 NOTE — Op Note (Signed)
Operative note   Surgeon:Ezio Wieck Lawyer: None    Preop diagnosis: 1.  Achilles tendinosis left 2.  Gastroc equinus left lower leg    Postop diagnosis: Same    Procedure: 1.  Achilles debridement with repair left Achilles tendon 2.  Strayer gastroc recession left lower leg    EBL: Minimal    Anesthesia:local and general.  Local consisted of a one-to-one mixture of 0.25% bupivacaine and Exparel long-acting anesthetic.  A total of 20 cc was used preoperatively.  An additional 10 cc of Exparel was used at the end of the case along both incision sites    Hemostasis: Epinephrine infiltrated along the incision site with bupivacaine    Specimen: Achilles tendinosis left lower leg    Complications: None    Operative indications:Heather Burnett is an 49 y.o. that presents today for surgical intervention.  The risks/benefits/alternatives/complications have been discussed and consent has been given.    Procedure:  Patient was brought into the OR and placed on the operating table in theprone position. After anesthesia was obtained theleft lower extremity was prepped and draped in usual sterile fashion.  Attention was directed to the posterior aspect of the left gastroc soleal junction where longitudinal incision was performed.  Sharp and blunt dissection carried down to the peritenon.  The small saphenous vein was noted at this time.  The peritenon was then incised and the sural nerve was noted deep to the peritenon.  This was retracted throughout the entire procedure.  At this time the gastroc soleal junction was noted and the gastroc recession was placed from medial to lateral.  Good distraction of the gastroc soleal region was performed.  The soleus muscle belly was noted deep and approximate 2 cm lengthening had been performed.  The wound was flushed with copious amounts of irrigation.  The peritenon was closed with a 3-0 Vicryl.  The subcutaneous tissue closed with a 3-0 Vicryl and  the skin closed with a 4-0 Monocryl.  Attention was then directed to the Achilles tendon at the level of the watershed region.  A bulbous palpable Achilles tendon was noted.  A longitudinal incision was performed along the Achilles itself.  Sharp and blunt dissection carried down to the peritenon.  This was then incised and reflected.  This was tagged for later anastomosis.  The bulbous Achilles tendon was noted at the level of the watershed band.  The central aspect of the Achilles was then debrided of the nonviable tissue.  Approximately 50% of the Achilles tendon was remaining and intact at this time.  The wound was flushed with copious amounts of irrigation.  Next a Topaz wand was used to infiltrate along the Achilles itself.  The longitudinal split within the Achilles was closed with a 3-0 Vicryl.  The peritenon was closed with a 3-0 Vicryl.  The subcutaneous tissue closed with a 4-0 Vicryl and the skin closed with a 4-0 Monocryl.  Bulky sterile dressing was applied.  Patient was placed in a neutral position and placed in an equalizer walker boot.    Patient tolerated the procedure and anesthesia well.  Was transported from the OR to the PACU with all vital signs stable and vascular status intact. To be discharged per routine protocol.  Will follow up in approximately 1 week in the outpatient clinic.

## 2019-12-15 NOTE — OR Nursing (Signed)
Per Dr. Vickki Muff via tele, patient is non weight bearing

## 2019-12-18 LAB — SURGICAL PATHOLOGY

## 2019-12-19 MED FILL — FUROSEMIDE 40 MG TAB: 40 | 90 days supply | Qty: 90 | Fill #2

## 2020-01-06 MED FILL — PROGESTERONE MICRONIZED 200: 200 | 30 days supply | Qty: 60 | Fill #2

## 2020-01-11 ENCOUNTER — Other Ambulatory Visit: Payer: Self-pay | Admitting: Podiatry

## 2020-01-11 ENCOUNTER — Other Ambulatory Visit: Payer: Self-pay

## 2020-01-11 ENCOUNTER — Ambulatory Visit
Admission: RE | Admit: 2020-01-11 | Discharge: 2020-01-11 | Disposition: A | Discharge status: Home / Self Care | Payer: No Typology Code available for payment source | Source: Ambulatory Visit | Attending: Podiatry | Admitting: Podiatry

## 2020-01-11 ENCOUNTER — Other Ambulatory Visit (HOSPITAL_COMMUNITY): Payer: Self-pay | Admitting: Podiatry

## 2020-01-11 DIAGNOSIS — M62462 Contracture of muscle, left lower leg: Secondary | ICD-10-CM

## 2020-01-11 DIAGNOSIS — M216X2 Other acquired deformities of left foot: Secondary | ICD-10-CM

## 2020-01-11 DIAGNOSIS — M21862 Other specified acquired deformities of left lower leg: Secondary | ICD-10-CM

## 2020-01-13 MED FILL — DULoxetine HCL 30 MG CPEP: 30 | 90 days supply | Qty: 90 | Fill #1

## 2020-01-13 MED FILL — traZODone HCL 100 MG TABS: 100 | 90 days supply | Qty: 180 | Fill #1

## 2020-01-13 MED FILL — CITALOPRAM HBR 40 MG TABLET: 40 | 90 days supply | Qty: 90 | Fill #1

## 2020-01-13 MED FILL — DULOXETINE HCL 60 MG CPEP: 60 | 90 days supply | Qty: 90 | Fill #1

## 2020-01-18 MED FILL — ATORVASTATIN 10 MG TABLET: 10 | 90 days supply | Qty: 90 | Fill #0

## 2020-01-23 ENCOUNTER — Other Ambulatory Visit: Payer: Self-pay

## 2020-01-23 MED FILL — SPIRONOLACTONE 25 MG TABS: 25 | 90 days supply | Qty: 90 | Fill #0

## 2020-01-31 MED FILL — METOPROLOL SUCCINATE ER 25: 25 | 90 days supply | Qty: 270 | Fill #1

## 2020-02-05 ENCOUNTER — Other Ambulatory Visit (INDEPENDENT_AMBULATORY_CARE_PROVIDER_SITE_OTHER): Payer: Self-pay | Admitting: Internal Medicine

## 2020-02-05 MED FILL — PROGESTERONE 200 MG CAPS: 200 | 30 days supply | Qty: 60 | Fill #0

## 2020-02-08 ENCOUNTER — Ambulatory Visit (INDEPENDENT_AMBULATORY_CARE_PROVIDER_SITE_OTHER): Payer: No Typology Code available for payment source | Admitting: Internal Medicine

## 2020-02-08 ENCOUNTER — Other Ambulatory Visit (INDEPENDENT_AMBULATORY_CARE_PROVIDER_SITE_OTHER): Payer: Self-pay | Admitting: Internal Medicine

## 2020-02-08 ENCOUNTER — Other Ambulatory Visit: Payer: Self-pay

## 2020-02-08 ENCOUNTER — Encounter (INDEPENDENT_AMBULATORY_CARE_PROVIDER_SITE_OTHER): Payer: Self-pay | Admitting: Internal Medicine

## 2020-02-08 VITALS — BP 130/90 | HR 102 | Temp 97.8°F | Ht 62.0 in | Wt 249.2 lb

## 2020-02-08 DIAGNOSIS — E282 Polycystic ovarian syndrome: Secondary | ICD-10-CM | POA: Diagnosis not present

## 2020-02-08 DIAGNOSIS — I10 Essential (primary) hypertension: Secondary | ICD-10-CM

## 2020-02-08 DIAGNOSIS — E782 Mixed hyperlipidemia: Secondary | ICD-10-CM

## 2020-02-08 DIAGNOSIS — E039 Hypothyroidism, unspecified: Secondary | ICD-10-CM

## 2020-02-08 HISTORY — DX: Hypothyroidism, unspecified: E03.9

## 2020-02-08 MED ORDER — SPIRONOLACTONE 50 MG PO TABS: 50.0000 mg | ORAL_TABLET | Freq: Every day | ORAL | 1 refills | Status: DC

## 2020-02-08 NOTE — Patient Instructions (Signed)
[facility] Dietary Recommendations for Weight Loss What to Avoid . Avoid added sugars o Often added sugar can be found in processed foods such as many condiments, dry cereals, cakes, cookies, chips, crisps, crackers, candies, sweetened drinks, etc.  o Read labels and AVOID/DECREASE use of foods with the following in their ingredient list: Sugar, fructose, high fructose corn syrup, sucrose, glucose, maltose, dextrose, molasses, cane sugar, brown sugar, any type of syrup, agave nectar, etc.   . Avoid snacking in between meals . Avoid foods made with flour o If you are going to eat food made with flour, choose those made with whole-grains; and, minimize your consumption as much as is tolerable . Avoid processed foods o These foods are generally stocked in the middle of the grocery store. Focus on shopping on the perimeter of the grocery.  . Avoid Meat  o We recommend following a plant-based diet at [facility]. Thus, we recommend avoiding meat as a general rule. Consider eating beans, legumes, eggs, and/or dairy products for regular protein sources o If you plan on eating meat limit to 4 ounces of meat at a time and choose lean options such as Fish, chicken, turkey. Avoid red meat intake such as pork and/or steak What to Include . Vegetables o GREEN LEAFY VEGETABLES: Kale, spinach, mustard greens, collard greens, cabbage, broccoli, etc. o OTHER: Asparagus, cauliflower, eggplant, carrots, peas, Brussel sprouts, tomatoes, bell peppers, zucchini, beets, cucumbers, etc. . Grains, seeds, and legumes o Beans: kidney beans, black eyed peas, garbanzo beans, black beans, pinto beans, etc. o Whole, unrefined grains: brown rice, barley, bulgur, oatmeal, etc. . Healthy fats  o Avoid highly processed fats such as vegetable oil o Examples of healthy fats: avocado, olives, virgin olive oil, dark chocolate (?72% Cocoa), nuts (peanuts, almonds, walnuts, cashews, pecans, etc.) . None to Low  Intake of Animal Sources of Protein o Meat sources: chicken, turkey, salmon, tuna. Limit to 4 ounces of meat at one time. o Consider limiting dairy sources, but when choosing dairy focus on: PLAIN Greek yogurt, cottage cheese, high-protein milk . Fruit o Choose berries  When to Eat . Intermittent Fasting: o Choosing not to eat for a specific time period, but DO FOCUS ON HYDRATION when fasting o Multiple Techniques: - Time Restricted Eating: eat 3 meals in a day, each meal lasting no more than 60 minutes, no snacks between meals - 16-18 hour fast: fast for 16 to 18 hours up to 7 days a week. Often suggested to start with 2-3 nonconsecutive days per week.  . Remember the time you sleep is counted as fasting.  . Examples of eating schedule: Fast from 7:00pm-11:00am. Eat between 11:00am-7:00pm.  - 24-hour fast: fast for 24 hours up to every other day. Often suggested to start with 1 day per week . Remember the time you sleep is counted as fasting . Examples of eating schedule:  o Eating day: eat 2-3 meals on your eating day. If doing 2 meals, each meal should last no more than 90 minutes. If doing 3 meals, each meal should last no more than 60 minutes. Finish last meal by 7:00pm. o Fasting day: Fast until 7:00pm.  o IF YOU FEEL UNWELL FOR ANY REASON/IN ANY WAY WHEN FASTING, STOP FASTING BY EATING A NUTRITIOUS SNACK OR LIGHT MEAL o ALWAYS FOCUS ON HYDRATION DURING FASTS - Acceptable Hydration sources: water, broths, tea/coffee (black tea/coffee is best but using a small amount of whole-fat dairy products in coffee/tea is acceptable).  -   Poor Hydration Sources: anything with sugar or artificial sweeteners added to it  These recommendations have been developed for patients that are actively receiving medical care from either Dr. Anastasio Champion or Jeralyn Ruths, DNP, NP-C at [facility]. These recommendations are developed for patients with specific medical conditions and are not meant to be  distributed or used by others that are not actively receiving care from either provider listed above at [facility]. It is not appropriate to participate in the above eating plans without proper medical supervision.   Reference: Rexanne Mano. The obesity code. [place]; 2016.    [email or web address]  Melatonin 1mg  capsules(#60)

## 2020-02-08 NOTE — Progress Notes (Signed)
Metrics: Intervention Frequency ACO  Documented Smoking Status Yearly  Screened one or more times in 24 months  Cessation Counseling or  Active cessation medication Past 24 months  Past 24 months   Guideline developer: UpToDate (See UpToDate for funding source) Date Released: 2014       Wellness Office Visit  Subjective:  Patient ID: Heather Burnett, female    DOB: Jan 17, 1971  Age: 49 y.o. MRN: [id number]  CC: This lady comes in for follow-up of diabetes, hypertension, morbid obesity, hypothyroidism.  She also has PCOS. HPI  She had finally left foot surgery 2 months ago and is making a reasonably good recovery.  Apparently was quite extensive surgery. She has gained weight as she has not been active and eating habits have not been optimal. She is tolerating all the medications that she has been on including NP thyroid and spironolactone.  Her acne seems to bother her still.  She is on a small dose of spironolactone. She continues with testosterone cream and progesterone. Past Medical History:  Diagnosis Date  . Allergy   . Anxiety   . Asthma    WELL CONTROLLED  . Cancer ([place])    melanoma on my chest  . COPD (chronic obstructive pulmonary disease) ([place])   . DDD (degenerative disc disease), cervical    and lower back  . Depression   . DJD (degenerative joint disease)   . Dysrhythmia   . Empty sella syndrome ([place])    followed at [facility]  . Family history of adverse reaction to anesthesia    mother almost died with anesthesia in the past, hard to wake up and got real sick on my stomach vomiting, now she receives local anethesia.  Marland Kitchen GERD (gastroesophageal reflux disease)   . Headache    history of migraine  . Heart murmur   . History of methicillin resistant staphylococcus aureus (MRSA)   . History of palpitations 07/2004  . Hyperlipidemia   . Hypertension   . Hypothyroidism   . Hypothyroidism, adult 02/08/2020  . Lymphocytosis   . Neuropathy 2016  . PCOS (polycystic  ovarian syndrome)   . Pituitary microadenoma ([place])    followed at [facility]  . Pneumonia 2008  . Pre-diabetes   . Sleep apnea    USES CPAP  . Thyroid disease   . Von Willebrand disease ([place]) 2012   PT STATES SHE HAS NEVER HAD ANY ISSUES WITH BLEEDING      Family History  Problem Relation Age of Onset  . Diabetes Father   . Hyperlipidemia Father   . Hypertension Father   . Cancer Father   . Heart attack Father   . Breast cancer Sister 60  . Breast cancer Maternal Aunt 66  . Breast cancer Maternal Grandmother 80    Social History   Social History Narrative   Divorced since 2009.Lives with 47 year old mum.CMA at [employer].   Social History   Tobacco Use  . Smoking status: Former Smoker    Years: 8.00    Types: Cigarettes    Quit date: 04/09/1994    Years since quitting: 25.8  . Smokeless tobacco: Never Used  Substance Use Topics  . Alcohol use: No    Alcohol/week: 0.0 standard drinks    Current Meds  Medication Sig  . acetaminophen (TYLENOL) 500 MG tablet Take 500 mg by mouth every 6 (six) hours as needed.  Marland Kitchen albuterol (VENTOLIN HFA) 108 (90 Base) MCG/ACT inhaler Inhale 2 puffs into the lungs every  6 (six) hours as needed for wheezing or shortness of breath.  Marland Kitchen atorvastatin (LIPITOR) 10 MG tablet Take 10 mg by mouth daily at 6 PM.   . cetirizine (ZYRTEC) 10 MG tablet Take 10 mg by mouth daily as needed (seasonal allergies.).   Marland Kitchen Cholecalciferol (VITAMIN D-3) 125 MCG (5000 UT) TABS Take 5,000 Units by mouth daily.  . citalopram (CELEXA) 40 MG tablet Take 20 mg by mouth every morning.   . clindamycin (CLINDAGEL) 1 % gel Apply 1 application topically daily as needed (acne).   Marland Kitchen doxycycline (VIBRAMYCIN) 100 MG capsule Take 100 mg by mouth 2 (two) times daily as needed (acne flare ups).   . DULoxetine (CYMBALTA) 30 MG capsule Take 30 mg by mouth daily in the afternoon.  . DULoxetine (CYMBALTA) 60 MG capsule Take 60 mg by mouth daily.  . Fluticasone-Salmeterol (ADVAIR  DISKUS) 250-50 MCG/DOSE AEPB Inhale 1 puff into the lungs every 12 (twelve) hours as needed (seasonal allergies).   . furosemide (LASIX) 40 MG tablet Take 40 mg by mouth daily in the afternoon.   . hydrOXYzine (ATARAX/VISTARIL) 25 MG tablet Take 25 mg by mouth at bedtime.  . lamoTRIgine (LAMICTAL) 200 MG tablet Take 200 mg by mouth every morning.   . metFORMIN (GLUCOPHAGE) 1000 MG tablet Take 1,000 mg by mouth 2 (two) times daily with a meal. Lunch & supper  . metoprolol succinate (TOPROL XL) 50 MG 24 hr tablet Take 1 tablet (50 mg total) by mouth 2 (two) times daily. Take with or immediately following a meal.  . montelukast (SINGULAIR) 10 MG tablet Take 10 mg by mouth at bedtime as needed.  . NP THYROID 90 MG tablet Take 1 tablet (90 mg total) by mouth every evening.  . progesterone (PROMETRIUM) 200 MG capsule TAKE 2 CAPSULES BY MOUTH EVERY NIGHT  . Testosterone 20 % CREA Apply 5 mg topically daily.       Objective:   Today's Vitals: BP 130/90 (BP Location: Left Arm, Patient Position: Sitting, Cuff Size: Normal)   Pulse (!) 102   Temp 97.8 F (36.6 C) (Temporal)   Ht 5\' 2"  (1.575 m)   Wt 249 lb 3.2 oz (113 kg)   SpO2 97%   BMI 45.58 kg/m  Vitals with BMI 02/08/2020 12/15/2019 12/15/2019  Height 5\' 2"  - -  Weight 249 lbs 3 oz - -  BMI [id number] - -  Systolic [id number]  Diastolic 90 64 55  Pulse [id number] 80 74  Some encounter information is confidential and restricted. Go to Review Flowsheets activity to see all data.     Physical Exam  She looks systemically well.  Blood pressure is reasonable.  She remains morbidly obese.     Assessment   1. Bilateral polycystic ovarian syndrome   2. Benign essential HTN   3. Essential hypertension   4. Morbid obesity ([place])   5. Mixed hyperlipidemia   6. Hypothyroidism, adult       Tests ordered No orders of the defined types were placed in this encounter.    Plan: 1. For her hypothyroidism, we will increase the dose of NP thyroid  to 120 mg daily and I have given her NP thyroid 30 mg tablet samples to add to the NP thyroid 90 mg tablets she already has to see if she tolerates this dose.  She will let me know and then I can send a prescription for NP 120 mg tablets to be taken 1 daily. 2. For PCOS and  acne, we will increase spironolactone to 50 mg daily and I have sent a new prescription for this. 3. I told her that she needs to really focus on nutrition that we have discussed many times before including intermittent fasting and also to focus on a plant-based diet as much as possible. 4. I will see her soon in about a month's time to see how she is doing and we will repeat blood work again.   Meds ordered this encounter  Medications  . spironolactone (ALDACTONE) 50 MG tablet    Sig: Take 1 tablet (50 mg total) by mouth daily.    Dispense:  90 tablet    Refill:  1    Jazzmine Kleiman Luther Parody, MD

## 2020-03-05 MED FILL — METFORMIN HCL 1000 MG TABS: 1000 | 90 days supply | Qty: 180 | Fill #0

## 2020-03-12 ENCOUNTER — Other Ambulatory Visit: Payer: Self-pay

## 2020-03-12 ENCOUNTER — Encounter (INDEPENDENT_AMBULATORY_CARE_PROVIDER_SITE_OTHER): Payer: Self-pay | Admitting: Internal Medicine

## 2020-03-12 ENCOUNTER — Ambulatory Visit (INDEPENDENT_AMBULATORY_CARE_PROVIDER_SITE_OTHER): Payer: No Typology Code available for payment source | Admitting: Internal Medicine

## 2020-03-12 VITALS — BP 145/88 | HR 89 | Temp 97.1°F | Resp 18 | Ht 62.0 in | Wt 238.0 lb

## 2020-03-12 DIAGNOSIS — E119 Type 2 diabetes mellitus without complications: Secondary | ICD-10-CM

## 2020-03-12 DIAGNOSIS — I10 Essential (primary) hypertension: Secondary | ICD-10-CM

## 2020-03-12 DIAGNOSIS — E039 Hypothyroidism, unspecified: Secondary | ICD-10-CM

## 2020-03-12 DIAGNOSIS — E282 Polycystic ovarian syndrome: Secondary | ICD-10-CM

## 2020-03-12 DIAGNOSIS — E782 Mixed hyperlipidemia: Secondary | ICD-10-CM

## 2020-03-12 NOTE — Progress Notes (Signed)
Metrics: Intervention Frequency ACO  Documented Smoking Status Yearly  Screened one or more times in 24 months  Cessation Counseling or  Active cessation medication Past 24 months  Past 24 months   Guideline developer: UpToDate (See UpToDate for funding source) Date Released: 2014       Wellness Office Visit  Subjective:  Patient ID: Heather Burnett, female    DOB: 1971/08/18  Age: 49 y.o. MRN: [id number]  CC: This lady comes in for follow-up of PCOS, morbid obesity, diabetes, hypertension, hypothyroidism. HPI  Since the last appointment, she has done well and managed to lose 11 pounds.  She is following a diet of intermittent fasting combined with trying eat healthier.  She is still eating animal protein. She has tolerated the higher dose of NP thyroid 120 mg daily. She reduced her Metformin from 1000 mg twice a day to 1000 mg once a day as she was getting stomach issues.  She continues on statin therapy as well as increased dose of spironolactone. She has not taken testosterone cream on a consistent basis. Past Medical History:  Diagnosis Date  . Allergy   . Anxiety   . Asthma    WELL CONTROLLED  . Cancer ([place])    melanoma on my chest  . COPD (chronic obstructive pulmonary disease) ([place])   . DDD (degenerative disc disease), cervical    and lower back  . Depression   . DJD (degenerative joint disease)   . Dysrhythmia   . Empty sella syndrome ([place])    followed at [facility]  . Family history of adverse reaction to anesthesia    mother almost died with anesthesia in the past, hard to wake up and got real sick on my stomach vomiting, now she receives local anethesia.  Marland Kitchen GERD (gastroesophageal reflux disease)   . Headache    history of migraine  . Heart murmur   . History of methicillin resistant staphylococcus aureus (MRSA)   . History of palpitations 07/2004  . Hyperlipidemia   . Hypertension   . Hypothyroidism   . Hypothyroidism, adult 02/08/2020  . Lymphocytosis   .  Neuropathy 2016  . PCOS (polycystic ovarian syndrome)   . Pituitary microadenoma ([place])    followed at [facility]  . Pneumonia 2008  . Pre-diabetes   . Sleep apnea    USES CPAP  . Thyroid disease   . Von Willebrand disease ([place]) 2012   PT STATES SHE HAS NEVER HAD ANY ISSUES WITH BLEEDING   Past Surgical History:  Procedure Laterality Date  . ACHILLES TENDON SURGERY Left 12/15/2019   Procedure: ACHILLES TENDON REPAIR PRIMARY;  Surgeon: Samara Deist, DPM;  Location: [facility] ORS;  Service: Podiatry;  Laterality: Left;  . BACK SURGERY     removal of 2 lumbar vertebrae  . BREAST BIOPSY Right 2010   NEG  . CHOLECYSTECTOMY    . COLONOSCOPY  03/03/2013, 02/06/2008  . COLONOSCOPY WITH PROPOFOL N/A 08/05/2018   Procedure: COLONOSCOPY WITH PROPOFOL;  Surgeon: Manya Silvas, MD;  Location: [facility] ENDOSCOPY;  Service: Endoscopy;  Laterality: N/A;  . EGD  02/06/2008  . GASTROC RECESSION EXTREMITY Left 12/15/2019   Procedure: GASTROC RECESSION EXTREMITY;  Surgeon: Samara Deist, DPM;  Location: [facility] ORS;  Service: Podiatry;  Laterality: Left;  . HALLUX VALGUS CHEILECTOMY Left 08/21/2016   Procedure: HALLUX VALGUS CHEILECTOMY;  Surgeon: Sharlotte Alamo, DPM;  Location: [facility] ORS;  Service: Podiatry;  Laterality: Left;  . HEMORRHOID SURGERY  10/2014  . SPINE SURGERY    .  TONSILLECTOMY AND ADENOIDECTOMY       Family History  Problem Relation Age of Onset  . Diabetes Father   . Hyperlipidemia Father   . Hypertension Father   . Cancer Father   . Heart attack Father   . Breast cancer Sister 40  . Breast cancer Maternal Aunt 25  . 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   Social History   Tobacco Use  . Smoking status: Former Smoker    Years: 8.00    Types: Cigarettes    Quit date: 04/09/1994    Years since quitting: 25.9  . Smokeless tobacco: Never Used  Substance Use Topics  .  Alcohol use: No    Alcohol/week: 0.0 standard drinks    Current Meds  Medication Sig  . acetaminophen (TYLENOL) 500 MG tablet Take 500 mg by mouth every 6 (six) hours as needed.  Marland Kitchen albuterol (VENTOLIN HFA) 108 (90 Base) MCG/ACT inhaler Inhale 2 puffs into the lungs every 6 (six) hours as needed for wheezing or shortness of breath.  Marland Kitchen atorvastatin (LIPITOR) 10 MG tablet Take 10 mg by mouth daily at 6 PM.   . cetirizine (ZYRTEC) 10 MG tablet Take 10 mg by mouth daily as needed (seasonal allergies.).   Marland Kitchen Cholecalciferol (VITAMIN D-3) 125 MCG (5000 UT) TABS Take 5,000 Units by mouth daily.  . citalopram (CELEXA) 40 MG tablet Take 20 mg by mouth every morning.   . clindamycin (CLINDAGEL) 1 % gel Apply 1 application topically daily as needed (acne).   Marland Kitchen doxycycline (VIBRAMYCIN) 100 MG capsule Take 100 mg by mouth 2 (two) times daily as needed (acne flare ups).   . DULoxetine (CYMBALTA) 30 MG capsule Take 30 mg by mouth daily in the afternoon.  . DULoxetine (CYMBALTA) 60 MG capsule Take 60 mg by mouth daily.  . Fluticasone-Salmeterol (ADVAIR DISKUS) 250-50 MCG/DOSE AEPB Inhale 1 puff into the lungs every 12 (twelve) hours as needed (seasonal allergies).   . furosemide (LASIX) 40 MG tablet Take 40 mg by mouth daily in the afternoon.   . hydrOXYzine (ATARAX/VISTARIL) 25 MG tablet Take 25 mg by mouth at bedtime.  . lamoTRIgine (LAMICTAL) 200 MG tablet Take 200 mg by mouth every morning.   . metFORMIN (GLUCOPHAGE) 1000 MG tablet Take 1,000 mg by mouth 2 (two) times daily with a meal. Lunch & supper  . metoprolol succinate (TOPROL XL) 50 MG 24 hr tablet Take 1 tablet (50 mg total) by mouth 2 (two) times daily. Take with or immediately following a meal.  . montelukast (SINGULAIR) 10 MG tablet Take 10 mg by mouth at bedtime as needed.  . NP THYROID 90 MG tablet Take 1 tablet (90 mg total) by mouth every evening.  . progesterone (PROMETRIUM) 200 MG capsule TAKE 2 CAPSULES BY MOUTH EVERY NIGHT  .  spironolactone (ALDACTONE) 50 MG tablet Take 1 tablet (50 mg total) by mouth daily.  . Testosterone 20 % CREA Apply 5 mg topically daily.       Depression screen [facility] 2/9 02/08/2020 07/29/2016 04/29/2016 12/25/2015 07/31/2015  Decreased Interest 0 0 0 0 0  Down, Depressed, Hopeless 0 0 1 1 0  PHQ - 2 Score 0 0 1 1 0     Objective:   Today's Vitals: BP (!) 145/88 (BP Location: Right Arm, Patient Position: Sitting, Cuff Size: Normal)   Pulse 89   Temp Marland Kitchen)  97.1 F (36.2 C) (Temporal)   Resp 18   Ht 5\' 2"  (1.575 m)   Wt 238 lb (108 kg)   SpO2 98%   BMI 43.53 kg/m  Vitals with BMI 03/12/2020 02/08/2020 12/15/2019  Height 5\' 2"  5\' 2"  -  Weight 238 lbs 249 lbs 3 oz -  BMI [id number] -  Systolic [id number]  Diastolic 88 90 64  Pulse 89 102 80  Some encounter information is confidential and restricted. Go to Review Flowsheets activity to see all data.     Physical Exam   She looks systemically well.  She has lost 11 pounds since her last visit.  Blood pressure slightly elevated.    Assessment   1. Essential hypertension   2. Bilateral polycystic ovarian syndrome   3. Morbid obesity ([place])   4. Mixed hyperlipidemia   5. Hypothyroidism, adult   6. Diabetes mellitus without complication ([place])       Tests ordered Orders Placed This Encounter  Procedures  . COMPLETE METABOLIC PANEL WITH GFR  . Cardio IQ Adv Lipid and Inflamm Pnl  . Hemoglobin A1c  . T3, free  . Other/Misc lab test     Plan: 1. Blood work is ordered. 2. She will continue with the current medications.  We may need to adjust further.  We will check cardio IQ lipid panel with inflammatory markers as well as insulin resistance. 3. I will see her for close follow-up in about a month's time.  In the meantime, she will continue with medications for hypertension/PCOS/hypothyroidism and diabetes.  These all appear to be stable at the present time and improving.   No orders of the defined types were placed in  this encounter.   Doree Albee, MD

## 2020-03-17 LAB — COMPLETE METABOLIC PANEL WITH GFR
AG Ratio: 1.8 (calc) (ref 1.0–2.5)
ALT: 21 U/L (ref 6–29)
AST: 23 U/L (ref 10–35)
Albumin: 4.6 g/dL (ref 3.6–5.1)
Alkaline phosphatase (APISO): 66 U/L (ref 31–125)
BUN: 13 mg/dL (ref 7–25)
CO2: 33 mmol/L — ABNORMAL HIGH (ref 20–32)
Calcium: 10.6 mg/dL — ABNORMAL HIGH (ref 8.6–10.2)
Chloride: 97 mmol/L — ABNORMAL LOW (ref 98–110)
Creat: 0.99 mg/dL (ref 0.50–1.10)
GFR, Est African American: 79 mL/min/{1.73_m2} (ref >=60)
GFR, Est Non African American: 68 mL/min/{1.73_m2} (ref >=60)
Globulin: 2.6 g/dL (calc) (ref 1.9–3.7)
Glucose, Bld: 105 mg/dL — ABNORMAL HIGH (ref 65–99)
Potassium: 4.4 mmol/L (ref 3.5–5.3)
Sodium: 140 mmol/L (ref 135–146)
Total Bilirubin: 0.5 mg/dL (ref 0.2–1.2)
Total Protein: 7.2 g/dL (ref 6.1–8.1)

## 2020-03-17 LAB — T3, FREE: T3, Free: 4.7 pg/mL — ABNORMAL HIGH (ref 2.3–4.2)

## 2020-03-17 LAB — CARDIO IQ INSULIN RESISTANCE PANEL WITH SCORE
C-PEPTIDE, LC/MS/MS: 10.83 ng/mL — ABNORMAL HIGH (ref 0.68–2.16)
INSULIN, INTACT, LC/MS/MS: 126 u[IU]/mL — ABNORMAL HIGH (ref <=16)
Insulin Resistance Score: 100 — ABNORMAL HIGH (ref <=66)

## 2020-03-17 LAB — HEMOGLOBIN A1C
Hgb A1c MFr Bld: 6.3 % of total Hgb — ABNORMAL HIGH (ref <5.7)
Mean Plasma Glucose: 134 (calc)
eAG (mmol/L): 7.4 (calc)

## 2020-03-17 LAB — CARDIO IQ ADV LIPID AND INFLAMM PNL
Apolipoprotein B: 130 mg/dL — ABNORMAL HIGH (ref <90)
Cholesterol: 242 mg/dL — ABNORMAL HIGH (ref <200)
HDL: 47 mg/dL — ABNORMAL LOW (ref >49)
LDL Large: 11466 nmol/L (ref >6729)
LDL Medium: 359 nmol/L — ABNORMAL HIGH (ref <215)
LDL Particle Number: 2089 nmol/L — ABNORMAL HIGH (ref <1138)
LDL Peak Size: 210.3 Angstrom — ABNORMAL LOW (ref >222.9)
LDL Small: 518 nmol/L — ABNORMAL HIGH (ref <142)
Lipoprotein (a): <10 nmol/L (ref <75)
Non-HDL Cholesterol (Calc): 195 mg/dL (calc) — ABNORMAL HIGH (ref <130)
PLAC: 120 nmol/min/mL (ref <124)
Total CHOL/HDL Ratio: 5.1 calc — ABNORMAL HIGH (ref <3.6)
Triglycerides: 479 mg/dL — ABNORMAL HIGH (ref <150)
hs-CRP: 4.5 mg/L — ABNORMAL HIGH (ref <1.0)

## 2020-03-19 ENCOUNTER — Encounter (INDEPENDENT_AMBULATORY_CARE_PROVIDER_SITE_OTHER): Payer: Self-pay | Admitting: Internal Medicine

## 2020-03-19 ENCOUNTER — Other Ambulatory Visit (INDEPENDENT_AMBULATORY_CARE_PROVIDER_SITE_OTHER): Payer: Self-pay | Admitting: Internal Medicine

## 2020-03-19 MED ORDER — THYROID 120 MG PO TABS: 120.0000 mg | ORAL_TABLET | Freq: Every day | ORAL | 3 refills | Status: DC

## 2020-03-19 MED FILL — NP THYROID 120 MG TABLET: 120 | 30 days supply | Qty: 30 | Fill #0

## 2020-03-19 MED FILL — FUROSEMIDE 40 MG TAB: 40 | 30 days supply | Qty: 30 | Fill #0

## 2020-03-19 NOTE — Telephone Encounter (Signed)
I have sent a higher dose of NP thyroid 120 mg tablets to [facility] and I sent her a message regarding all her questions.

## 2020-04-05 MED FILL — DULOXETINE HCL 60 MG CPEP: 60 | 90 days supply | Qty: 90 | Fill #0

## 2020-04-05 MED FILL — ESZOPICLONE 3 MG TABS: 3 | 30 days supply | Qty: 30 | Fill #0

## 2020-04-05 MED FILL — CITALOPRAM HBR 20 MG TABLET: 20 | 90 days supply | Qty: 90 | Fill #0

## 2020-04-08 MED FILL — SUBVENITE 200 MG TABS: 200 | 90 days supply | Qty: 90 | Fill #0

## 2020-04-11 MED FILL — ATORVASTATIN CALCIUM 10 MG: 10 | 90 days supply | Qty: 90 | Fill #1

## 2020-04-12 MED FILL — DULoxetine HCL 30 MG CPEP: 30 | 90 days supply | Qty: 90 | Fill #0

## 2020-04-15 ENCOUNTER — Other Ambulatory Visit: Payer: Self-pay

## 2020-04-15 ENCOUNTER — Encounter (INDEPENDENT_AMBULATORY_CARE_PROVIDER_SITE_OTHER): Payer: Self-pay | Admitting: Internal Medicine

## 2020-04-15 ENCOUNTER — Other Ambulatory Visit (INDEPENDENT_AMBULATORY_CARE_PROVIDER_SITE_OTHER): Payer: Self-pay | Admitting: Internal Medicine

## 2020-04-15 ENCOUNTER — Ambulatory Visit (INDEPENDENT_AMBULATORY_CARE_PROVIDER_SITE_OTHER): Payer: No Typology Code available for payment source | Admitting: Internal Medicine

## 2020-04-15 DIAGNOSIS — E119 Type 2 diabetes mellitus without complications: Secondary | ICD-10-CM | POA: Diagnosis not present

## 2020-04-15 DIAGNOSIS — E039 Hypothyroidism, unspecified: Secondary | ICD-10-CM

## 2020-04-15 DIAGNOSIS — I10 Essential (primary) hypertension: Secondary | ICD-10-CM | POA: Diagnosis not present

## 2020-04-15 MED ORDER — RYBELSUS 3 MG PO TABS: 3.0000 mg | ORAL_TABLET | Freq: Every day | ORAL | 3 refills | Status: DC

## 2020-04-15 MED ORDER — THYROID 30 MG PO TABS: 30.0000 mg | ORAL_TABLET | Freq: Every day | ORAL | 3 refills | Status: DC

## 2020-04-15 MED FILL — NP THYROID 30 MG TABLET: 30 | 30 days supply | Qty: 30 | Fill #0

## 2020-04-15 MED FILL — RYBELSUS 3 MG TABS: 3 | 30 days supply | Qty: 30 | Fill #0

## 2020-04-15 NOTE — Progress Notes (Signed)
Metrics: Intervention Frequency ACO  Documented Smoking Status Yearly  Screened one or more times in 24 months  Cessation Counseling or  Active cessation medication Past 24 months  Past 24 months   Guideline developer: UpToDate (See UpToDate for funding source) Date Released: 2014       Wellness Office Visit  Subjective:  Patient ID: Heather Burnett, female    DOB: 11/17/1970  Age: 49 y.o. MRN: [id number]  CC: This lady comes in for follow-up of morbid obesity, diabetes, hypertension, hypothyroidism. HPI  She was doing reasonably well but appears to have had a plateau in terms of weight loss.  She has tolerated the higher dose of NP thyroid. She has had difficulty with insomnia.  She has tried many prescription medicines and also melatonin from other sources before. She is doing intermittent fasting almost on a daily basis. She wonders whether the Metformin should be continued because she is having GI side effects.  She does not feel it is effective. Past Medical History:  Diagnosis Date  . Allergy   . Anxiety   . Asthma    WELL CONTROLLED  . Cancer ([place])    melanoma on my chest  . COPD (chronic obstructive pulmonary disease) ([place])   . DDD (degenerative disc disease), cervical    and lower back  . Depression   . DJD (degenerative joint disease)   . Dysrhythmia   . Empty sella syndrome ([place])    followed at [facility]  . Family history of adverse reaction to anesthesia    mother almost died with anesthesia in the past, hard to wake up and got real sick on my stomach vomiting, now she receives local anethesia.  Marland Kitchen GERD (gastroesophageal reflux disease)   . Headache    history of migraine  . Heart murmur   . History of methicillin resistant staphylococcus aureus (MRSA)   . History of palpitations 07/2004  . Hyperlipidemia   . Hypertension   . Hypothyroidism   . Hypothyroidism, adult 02/08/2020  . Lymphocytosis   . Neuropathy 2016  . PCOS (polycystic ovarian syndrome)   .  Pituitary microadenoma ([place])    followed at [facility]  . Pneumonia 2008  . Pre-diabetes   . Sleep apnea    USES CPAP  . Thyroid disease   . Von Willebrand disease ([place]) 2012   PT STATES SHE HAS NEVER HAD ANY ISSUES WITH BLEEDING   Past Surgical History:  Procedure Laterality Date  . ACHILLES TENDON SURGERY Left 12/15/2019   Procedure: ACHILLES TENDON REPAIR PRIMARY;  Surgeon: Samara Deist, DPM;  Location: [facility] ORS;  Service: Podiatry;  Laterality: Left;  . BACK SURGERY     removal of 2 lumbar vertebrae  . BREAST BIOPSY Right 2010   NEG  . CHOLECYSTECTOMY    . COLONOSCOPY  03/03/2013, 02/06/2008  . COLONOSCOPY WITH PROPOFOL N/A 08/05/2018   Procedure: COLONOSCOPY WITH PROPOFOL;  Surgeon: Manya Silvas, MD;  [facility];  Service: Endoscopy;  Laterality: N/A;  . EGD  02/06/2008  . GASTROC RECESSION EXTREMITY Left 12/15/2019   Procedure: GASTROC RECESSION EXTREMITY;  Surgeon: Samara Deist, DPM;  Location: [facility] ORS;  Service: Podiatry;  Laterality: Left;  . HALLUX VALGUS CHEILECTOMY Left 08/21/2016   Procedure: HALLUX VALGUS CHEILECTOMY;  Surgeon: Sharlotte Alamo, DPM;  Location: [facility] ORS;  Service: Podiatry;  Laterality: Left;  . HEMORRHOID SURGERY  10/2014  . SPINE SURGERY    . TONSILLECTOMY AND ADENOIDECTOMY       Family History  Problem  Relation Age of Onset  . Diabetes Father   . Hyperlipidemia Father   . Hypertension Father   . Cancer Father   . Heart attack Father   . Breast cancer Sister 100  . Breast cancer Maternal Aunt 45  . 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   Social History   Tobacco Use  . Smoking status: Former Smoker    Years: 8.00    Types: Cigarettes    Quit date: 04/09/1994    Years since quitting: 26.0  . Smokeless tobacco: Never Used  Substance Use Topics  . Alcohol use: No    Alcohol/week: 0.0 standard drinks     Current Meds  Medication Sig  . acetaminophen (TYLENOL) 500 MG tablet Take 500 mg by mouth every 6 (six) hours as needed.  Marland Kitchen albuterol (VENTOLIN HFA) 108 (90 Base) MCG/ACT inhaler Inhale 2 puffs into the lungs every 6 (six) hours as needed for wheezing or shortness of breath.  Marland Kitchen atorvastatin (LIPITOR) 10 MG tablet Take 10 mg by mouth daily at 6 PM.   . cetirizine (ZYRTEC) 10 MG tablet Take 10 mg by mouth daily as needed (seasonal allergies.).   Marland Kitchen Cholecalciferol (VITAMIN D-3) 125 MCG (5000 UT) TABS Take 5,000 Units by mouth daily.  . citalopram (CELEXA) 40 MG tablet Take 20 mg by mouth every morning.   Marland Kitchen doxycycline (VIBRAMYCIN) 100 MG capsule Take 100 mg by mouth 2 (two) times daily as needed (acne flare ups).   . DULoxetine (CYMBALTA) 60 MG capsule Take 60 mg by mouth daily.  . Fluticasone-Salmeterol (ADVAIR DISKUS) 250-50 MCG/DOSE AEPB Inhale 1 puff into the lungs every 12 (twelve) hours as needed (seasonal allergies).   . furosemide (LASIX) 40 MG tablet Take 40 mg by mouth daily in the afternoon.   . lamoTRIgine (LAMICTAL) 200 MG tablet Take 200 mg by mouth every morning.   . metFORMIN (GLUCOPHAGE) 1000 MG tablet Take 1,000 mg by mouth 2 (two) times daily with a meal. Lunch & supper  . metoprolol succinate (TOPROL XL) 50 MG 24 hr tablet Take 1 tablet (50 mg total) by mouth 2 (two) times daily. Take with or immediately following a meal.  . progesterone (PROMETRIUM) 200 MG capsule TAKE 2 CAPSULES BY MOUTH EVERY NIGHT  . spironolactone (ALDACTONE) 50 MG tablet Take 1 tablet (50 mg total) by mouth daily.  Marland Kitchen thyroid (NP THYROID) 120 MG tablet Take 1 tablet (120 mg total) by mouth daily before breakfast.       Depression screen [facility] 2/9 02/08/2020 07/29/2016 04/29/2016 12/25/2015 07/31/2015  Decreased Interest 0 0 0 0 0  Down, Depressed, Hopeless 0 0 1 1 0  PHQ - 2 Score 0 0 1 1 0     Objective:   Today's Vitals: BP 115/65 (BP Location: Left Arm, Patient Position: Sitting, Cuff Size:  Normal)   Pulse 64   Temp (!) 96.9 F (36.1 C) (Temporal)   Ht 5\' 2"  (1.575 m)   Wt 237 lb 9.6 oz (107.8 kg)   SpO2 95%   BMI 43.46 kg/m  Vitals with BMI 04/15/2020 03/12/2020 02/08/2020  Height 5\' 2"  5\' 2"  5\' 2"   Weight 237 lbs 10 oz 238 lbs 249 lbs 3 oz  BMI 43.45 15.17 61.60  Systolic 737 106 269  Diastolic 65 88 90  Pulse 64 89 102  Some encounter information is confidential and  restricted. Go to Review Flowsheets activity to see all data.     Physical Exam  She looks systemically well.  She has lost 1 further pound but still remains morbidly obese.     Assessment   1. Morbid obesity ([place])   2. Hypothyroidism, adult   3. Benign essential HTN   4. Diabetes mellitus without complication ([place])       Tests ordered No orders of the defined types were placed in this encounter.    Plan: 1. As far as her obesity is concerned, she may benefit from Rybelsus which I prescribed for which has been approved for obesity.  Hopefully she will tolerate it.  We also discussed intermittent fasting that she can do on a prolonged basis perhaps 24 hours or longer, keeping in mind that she should be well-hydrated and have salt intake also. 2. As far as her hypothyroidism is concerned, I think we can optimize further and I am going to add a small dose NP thyroid 30 mg daily in addition to the NP thyroid 120 mg daily that she takes.  She will let me know if she has side effects. 3. Her hypertension is well controlled and she will continue with metoprolol for this.  Spironolactone is also helping her in terms of her blood pressure and PCOS. 4. As far as her diabetes is concerned, nutrition is going to be optimized as best as we can.  She can discontinue Metformin as she is not tolerating it and hopefully Rybelsus will help her. 5. I will see her in a month's time for close follow-up to see how she is doing.  We also discussed exercise and when her podiatrist releases her, hopefully she can get  back to some cardiovascular exercise.   Meds ordered this encounter  Medications  . Semaglutide (RYBELSUS) 3 MG TABS    Sig: Take 3 mg by mouth daily.    Dispense:  30 tablet    Refill:  3  . thyroid (NP THYROID) 30 MG tablet    Sig: Take 1 tablet (30 mg total) by mouth daily before breakfast.    Dispense:  30 tablet    Refill:  3    Mersadie Kavanaugh Luther Parody, MD

## 2020-04-16 MED FILL — SPIRONOLACTONE 25 MG TABS: 25 | 90 days supply | Qty: 90 | Fill #1

## 2020-04-29 ENCOUNTER — Other Ambulatory Visit (INDEPENDENT_AMBULATORY_CARE_PROVIDER_SITE_OTHER): Payer: Self-pay | Admitting: Internal Medicine

## 2020-04-29 ENCOUNTER — Other Ambulatory Visit (INDEPENDENT_AMBULATORY_CARE_PROVIDER_SITE_OTHER): Payer: Self-pay

## 2020-04-29 MED ORDER — PROGESTERONE 200 MG PO CAPS: ORAL_CAPSULE | ORAL | 3 refills | Status: DC

## 2020-04-29 MED FILL — PROGESTERONE 200 MG CAPS: 200 | 30 days supply | Qty: 60 | Fill #0

## 2020-04-30 MED FILL — METOPROLOL SUCCINATE ER 25: 25 | 90 days supply | Qty: 270 | Fill #2

## 2020-05-01 MED FILL — ESZOPICLONE 3 MG TABS: 3 | 30 days supply | Qty: 30 | Fill #0

## 2020-05-07 MED FILL — FREESTYLE LITE TEST STRIP: 90 days supply | Qty: 100 | Fill #0

## 2020-05-07 MED FILL — FREESTYLE LANCETS: 90 days supply | Qty: 100 | Fill #0

## 2020-05-07 MED FILL — FREESTYLE FREEDOM LITE METE: W/DEVICE | 20 days supply | Qty: 1 | Fill #0

## 2020-05-09 ENCOUNTER — Encounter (INDEPENDENT_AMBULATORY_CARE_PROVIDER_SITE_OTHER): Payer: Self-pay | Admitting: Internal Medicine

## 2020-05-09 ENCOUNTER — Other Ambulatory Visit (INDEPENDENT_AMBULATORY_CARE_PROVIDER_SITE_OTHER): Payer: Self-pay | Admitting: Internal Medicine

## 2020-05-09 ENCOUNTER — Other Ambulatory Visit (INDEPENDENT_AMBULATORY_CARE_PROVIDER_SITE_OTHER): Payer: Self-pay

## 2020-05-09 MED ORDER — NP THYROID 60 MG PO TABS: 120.0000 mg | ORAL_TABLET | Freq: Every day | ORAL | 3 refills | Status: DC

## 2020-05-09 MED ORDER — RYBELSUS 3 MG PO TABS: 3.0000 mg | ORAL_TABLET | Freq: Every day | ORAL | 3 refills | Status: DC

## 2020-05-09 MED ORDER — THYROID 30 MG PO TABS: 30.0000 mg | ORAL_TABLET | Freq: Every day | ORAL | 3 refills | Status: DC

## 2020-05-09 MED FILL — RYBELSUS 3 MG TABS: 3 | 30 days supply | Qty: 30 | Fill #0

## 2020-05-09 MED FILL — NP THYROID 60 MG TABLET: 60 | 30 days supply | Qty: 60 | Fill #0

## 2020-05-09 MED FILL — NP THYROID 30 MG TABLET: 30 | 30 days supply | Qty: 30 | Fill #1

## 2020-05-14 ENCOUNTER — Ambulatory Visit (INDEPENDENT_AMBULATORY_CARE_PROVIDER_SITE_OTHER): Payer: No Typology Code available for payment source | Admitting: Internal Medicine

## 2020-05-28 MED FILL — METFORMIN HCL 1000 MG TABS: 1000 | 90 days supply | Qty: 180 | Fill #1

## 2020-06-03 MED FILL — RYBELSUS 3 MG TABS: 3 | 30 days supply | Qty: 30 | Fill #1

## 2020-06-17 ENCOUNTER — Ambulatory Visit (INDEPENDENT_AMBULATORY_CARE_PROVIDER_SITE_OTHER): Payer: No Typology Code available for payment source | Admitting: Internal Medicine

## 2020-06-17 ENCOUNTER — Encounter (INDEPENDENT_AMBULATORY_CARE_PROVIDER_SITE_OTHER): Payer: Self-pay | Admitting: Internal Medicine

## 2020-06-17 ENCOUNTER — Other Ambulatory Visit: Payer: Self-pay

## 2020-06-17 VITALS — BP 115/70 | HR 80 | Temp 97.3°F | Ht 62.0 in | Wt 212.8 lb

## 2020-06-17 DIAGNOSIS — I10 Essential (primary) hypertension: Secondary | ICD-10-CM

## 2020-06-17 DIAGNOSIS — E119 Type 2 diabetes mellitus without complications: Secondary | ICD-10-CM

## 2020-06-17 DIAGNOSIS — E039 Hypothyroidism, unspecified: Secondary | ICD-10-CM | POA: Diagnosis not present

## 2020-06-17 DIAGNOSIS — E782 Mixed hyperlipidemia: Secondary | ICD-10-CM | POA: Diagnosis not present

## 2020-06-17 NOTE — Progress Notes (Signed)
Metrics: Intervention Frequency ACO  Documented Smoking Status Yearly  Screened one or more times in 24 months  Cessation Counseling or  Active cessation medication Past 24 months  Past 24 months   Guideline developer: UpToDate (See UpToDate for funding source) Date Released: 2014       Wellness Office Visit  Subjective:  Patient ID: Heather Burnett, female    DOB: 1970-12-31  Age: 49 y.o. MRN: [id number]  CC: This lady comes in for follow-up of diabetes, obesity, hypertension, PCOS and hypothyroidism.  She also has hyperlipidemia. HPI  When I saw the last time, we increased her desiccated NP thyroid dose and started her on Rybelsus and I stressed her the importance of a plant-based diet.  Thankfully, she is now following a plant-based diet combined with intermittent fasting.  She feels better on Rybelsus also compared to Metformin.  She has tolerated the higher dose of NP thyroid. As a result of all these changes, she has lost significant amount of weight and she feels much better. Past Medical History:  Diagnosis Date  . Allergy   . Anxiety   . Asthma    WELL CONTROLLED  . Cancer ([place])    melanoma on my chest  . COPD (chronic obstructive pulmonary disease) ([place])   . DDD (degenerative disc disease), cervical    and lower back  . Depression   . DJD (degenerative joint disease)   . Dysrhythmia   . Empty sella syndrome ([place])    followed at [facility]  . Family history of adverse reaction to anesthesia    mother almost died with anesthesia in the past, hard to wake up and got real sick on my stomach vomiting, now she receives local anethesia.  Marland Kitchen GERD (gastroesophageal reflux disease)   . Headache    history of migraine  . Heart murmur   . History of methicillin resistant staphylococcus aureus (MRSA)   . History of palpitations 07/2004  . Hyperlipidemia   . Hypertension   . Hypothyroidism   . Hypothyroidism, adult 02/08/2020  . Lymphocytosis   . Neuropathy 2016  . PCOS  (polycystic ovarian syndrome)   . Pituitary microadenoma ([place])    followed at [facility]  . Pneumonia 2008  . Pre-diabetes   . Sleep apnea    USES CPAP  . Thyroid disease   . Von Willebrand disease ([place]) 2012   PT STATES SHE HAS NEVER HAD ANY ISSUES WITH BLEEDING   Past Surgical History:  Procedure Laterality Date  . ACHILLES TENDON SURGERY Left 12/15/2019   Procedure: ACHILLES TENDON REPAIR PRIMARY;  Surgeon: Samara Deist, DPM;  Location: [facility] ORS;  Service: Podiatry;  Laterality: Left;  . BACK SURGERY     removal of 2 lumbar vertebrae  . BREAST BIOPSY Right 2010   NEG  . CHOLECYSTECTOMY    . COLONOSCOPY  03/03/2013, 02/06/2008  . COLONOSCOPY WITH PROPOFOL N/A 08/05/2018   Procedure: COLONOSCOPY WITH PROPOFOL;  Surgeon: Manya Silvas, MD;  Location: [facility] ENDOSCOPY;  Service: Endoscopy;  Laterality: N/A;  . EGD  02/06/2008  . GASTROC RECESSION EXTREMITY Left 12/15/2019   Procedure: GASTROC RECESSION EXTREMITY;  Surgeon: Samara Deist, DPM;  Location: [facility] ORS;  Service: Podiatry;  Laterality: Left;  . HALLUX VALGUS CHEILECTOMY Left 08/21/2016   Procedure: HALLUX VALGUS CHEILECTOMY;  Surgeon: Sharlotte Alamo, DPM;  Location: [facility] ORS;  Service: Podiatry;  Laterality: Left;  . HEMORRHOID SURGERY  10/2014  . SPINE SURGERY    . TONSILLECTOMY AND ADENOIDECTOMY  Family History  Problem Relation Age of Onset  . Diabetes Father   . Hyperlipidemia Father   . Hypertension Father   . Cancer Father   . Heart attack Father   . Breast cancer Sister 48  . Breast cancer Maternal Aunt 49  . 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   Social History   Tobacco Use  . Smoking status: Former Smoker    Years: 8.00    Types: Cigarettes    Quit date: 04/09/1994    Years since quitting: 26.2  . Smokeless tobacco: Never Used  Substance Use Topics  . Alcohol use: No     Alcohol/week: 0.0 standard drinks    Current Meds  Medication Sig  . acetaminophen (TYLENOL) 500 MG tablet Take 500 mg by mouth every 6 (six) hours as needed.  Marland Kitchen albuterol (VENTOLIN HFA) 108 (90 Base) MCG/ACT inhaler Inhale 2 puffs into the lungs every 6 (six) hours as needed for wheezing or shortness of breath.  Marland Kitchen atorvastatin (LIPITOR) 10 MG tablet Take 10 mg by mouth daily at 6 PM.   . cetirizine (ZYRTEC) 10 MG tablet Take 10 mg by mouth daily as needed (seasonal allergies.).   Marland Kitchen Cholecalciferol (VITAMIN D-3) 125 MCG (5000 UT) TABS Take 5,000 Units by mouth daily.  . citalopram (CELEXA) 40 MG tablet Take 20 mg by mouth every morning.   Marland Kitchen doxycycline (VIBRAMYCIN) 100 MG capsule Take 100 mg by mouth 2 (two) times daily as needed (acne flare ups).   . DULoxetine (CYMBALTA) 60 MG capsule Take 60 mg by mouth daily.  . Fluticasone-Salmeterol (ADVAIR DISKUS) 250-50 MCG/DOSE AEPB Inhale 1 puff into the lungs every 12 (twelve) hours as needed (seasonal allergies).   . furosemide (LASIX) 40 MG tablet Take 40 mg by mouth daily in the afternoon.   . lamoTRIgine (LAMICTAL) 200 MG tablet Take 200 mg by mouth every morning.   . metoprolol succinate (TOPROL XL) 50 MG 24 hr tablet Take 1 tablet (50 mg total) by mouth 2 (two) times daily. Take with or immediately following a meal.  . NP THYROID 60 MG tablet Take 2 tablets (120 mg total) by mouth daily before breakfast.  . progesterone (PROMETRIUM) 200 MG capsule TAKE 2 CAPSULES BY MOUTH EVERY NIGHT  . Semaglutide (RYBELSUS) 3 MG TABS Take 3 mg by mouth daily.  Marland Kitchen spironolactone (ALDACTONE) 50 MG tablet Take 1 tablet (50 mg total) by mouth daily.  Marland Kitchen thyroid (NP THYROID) 30 MG tablet Take 1 tablet (30 mg total) by mouth daily before breakfast.      Depression screen [facility] 2/9 02/08/2020 07/29/2016 04/29/2016 12/25/2015 07/31/2015  Decreased Interest 0 0 0 0 0  Down, Depressed, Hopeless 0 0 1 1 0  PHQ - 2 Score 0 0 1 1 0     Objective:   Today's Vitals: BP  115/70 (BP Location: Left Arm, Patient Position: Sitting, Cuff Size: Normal)   Pulse 80   Temp (!) 97.3 F (36.3 C) (Temporal)   Ht 5\' 2"  (1.575 m)   Wt 212 lb 12.8 oz (96.5 kg)   SpO2 96%   BMI 38.92 kg/m  Vitals with BMI 06/17/2020 04/15/2020 03/12/2020  Height 5\' 2"  5\' 2"  5\' 2"   Weight 212 lbs 13 oz 237 lbs 10 oz 238 lbs  BMI 38.91 72.09 47.09  Systolic 628 366 294  Diastolic 70 65 88  Pulse 80 64 89  Some encounter information is confidential and restricted. Go to Review Flowsheets activity to see all data.     Physical Exam  She looks systemically well.  She has lost 25 pounds since the last time I saw her which is fantastic.  Blood pressure in a good range.     Assessment   1. Hypothyroidism, adult   2. Mixed hyperlipidemia   3. Benign essential HTN   4. Morbid obesity ([place])   5. Diabetes mellitus without complication ([place])       Tests ordered Orders Placed This Encounter  Procedures  . COMPLETE METABOLIC PANEL WITH GFR  . Hemoglobin A1c  . T3, free  . Lipid panel     Plan: 1. She will continue with the same dose of desiccated NP thyroid and I will check a T3 level today. 2. She will continue with atorvastatin for hyperlipidemia but if her A1c is in a good range, we can think about discontinuing this. 3. She will continue with metoprolol for hypertension. 4. She will continue with Rybelsus for her diabetes.  We will check an A1c. 5. Further recommendations will depend on blood results and I will see her in 3 months time for follow-up.  Today, I spent some time discussing the importance of COVID-19 vaccination and I have urged her to go ahead and get the vaccine as soon as possible.   No orders of the defined types were placed in this encounter.   Doree Albee, MD

## 2020-06-18 LAB — COMPLETE METABOLIC PANEL WITH GFR
AG Ratio: 1.7 (calc) (ref 1.0–2.5)
ALT: 29 U/L (ref 6–29)
AST: 27 U/L (ref 10–35)
Albumin: 4.7 g/dL (ref 3.6–5.1)
Alkaline phosphatase (APISO): 76 U/L (ref 31–125)
BUN: 15 mg/dL (ref 7–25)
CO2: 29 mmol/L (ref 20–32)
Calcium: 10.8 mg/dL — ABNORMAL HIGH (ref 8.6–10.2)
Chloride: 100 mmol/L (ref 98–110)
Creat: 0.76 mg/dL (ref 0.50–1.10)
GFR, Est African American: 108 mL/min/{1.73_m2} (ref >=60)
GFR, Est Non African American: 93 mL/min/{1.73_m2} (ref >=60)
Globulin: 2.7 g/dL (calc) (ref 1.9–3.7)
Glucose, Bld: 95 mg/dL (ref 65–99)
Potassium: 4.5 mmol/L (ref 3.5–5.3)
Sodium: 142 mmol/L (ref 135–146)
Total Bilirubin: 0.8 mg/dL (ref 0.2–1.2)
Total Protein: 7.4 g/dL (ref 6.1–8.1)

## 2020-06-18 LAB — HEMOGLOBIN A1C
Hgb A1c MFr Bld: 6.1 % of total Hgb — ABNORMAL HIGH (ref <5.7)
Mean Plasma Glucose: 128 (calc)
eAG (mmol/L): 7.1 (calc)

## 2020-06-18 LAB — LIPID PANEL
Cholesterol: 187 mg/dL (ref <200)
HDL: 41 mg/dL — ABNORMAL LOW (ref >=50)
LDL Cholesterol (Calc): 108 mg/dL (calc) — ABNORMAL HIGH
Non-HDL Cholesterol (Calc): 146 mg/dL (calc) — ABNORMAL HIGH (ref <130)
Total CHOL/HDL Ratio: 4.6 (calc) (ref <5.0)
Triglycerides: 267 mg/dL — ABNORMAL HIGH (ref <150)

## 2020-06-18 LAB — T3, FREE: T3, Free: 4.5 pg/mL — ABNORMAL HIGH (ref 2.3–4.2)

## 2020-07-09 ENCOUNTER — Other Ambulatory Visit (HOSPITAL_COMMUNITY): Payer: Self-pay | Admitting: Psychiatry

## 2020-07-10 ENCOUNTER — Other Ambulatory Visit (HOSPITAL_COMMUNITY): Payer: Self-pay | Admitting: Internal Medicine

## 2020-07-10 MED FILL — CITALOPRAM HBR 20 MG TABLET: 20 | 90 days supply | Qty: 90 | Fill #0

## 2020-07-10 MED FILL — ATORVASTATIN CALCIUM 10 MG: 10 | 90 days supply | Qty: 90 | Fill #0

## 2020-07-10 MED FILL — SUBVENITE 200 MG TABS: 200 | 90 days supply | Qty: 90 | Fill #0

## 2020-07-10 MED FILL — DULOXETINE HCL 60 MG CPEP: 60 | 90 days supply | Qty: 90 | Fill #0

## 2020-07-12 ENCOUNTER — Ambulatory Visit
Admission: EM | Admit: 2020-07-12 | Discharge: 2020-07-12 | Disposition: A | Discharge status: Home / Self Care | Payer: No Typology Code available for payment source | Attending: Emergency Medicine | Admitting: Emergency Medicine

## 2020-07-12 ENCOUNTER — Encounter: Payer: Self-pay | Admitting: Emergency Medicine

## 2020-07-12 DIAGNOSIS — M25512 Pain in left shoulder: Secondary | ICD-10-CM | POA: Diagnosis not present

## 2020-07-12 MED ORDER — CYCLOBENZAPRINE HCL 5 MG PO TABS: 5.0000 mg | ORAL_TABLET | Freq: Three times a day (TID) | ORAL | 0 refills | Status: DC | PRN

## 2020-07-12 MED ORDER — PREDNISONE 10 MG (21) PO TBPK: ORAL_TABLET | ORAL | 0 refills | Status: DC

## 2020-07-12 MED ORDER — DEXAMETHASONE SODIUM PHOSPHATE 10 MG/ML IJ SOLN
10.0000 mg | Freq: Once | INTRAMUSCULAR | Status: AC
  Administered 2020-07-12: 10 mg via INTRAMUSCULAR

## 2020-07-12 MED FILL — predniSONE 10 MG (21) TBPK: 10 | 6 days supply | Qty: 21 | Fill #0

## 2020-07-12 NOTE — ED Triage Notes (Signed)
Patient has left shoulder pain x 1 week- no injury. probable tendonitis/arthritis. Has appoinement this coming week to see orthopedic doctor.

## 2020-07-12 NOTE — ED Provider Notes (Signed)
[place]   [id number] 07/12/20 Arrival Time: 4166   Chief Complaint  Patient presents with  . Shoulder Pain     SUBJECTIVE: History from: patient.  Heather Burnett is a 49 y.o. female who presented to the urgent care with a complaint of left shoulder pain for the past 5 days.  Denies any precipitating event, trauma or injury.  Localized pain to the left shoulder.  She describes the pain as constant and achy.  She has tried OTC medications without relief.  Her symptoms are made worse with ROM.  She denies similar symptoms in the past.  Denies chills, fever, nausea, vomiting, diarrhea.   ROS: As per HPI.  All other pertinent ROS negative.      Past Medical History:  Diagnosis Date  . Allergy   . Anxiety   . Asthma    WELL CONTROLLED  . Cancer ([place])    melanoma on my chest  . COPD (chronic obstructive pulmonary disease) ([place])   . DDD (degenerative disc disease), cervical    and lower back  . Depression   . DJD (degenerative joint disease)   . Dysrhythmia   . Empty sella syndrome ([place])    followed at [facility]  . Family history of adverse reaction to anesthesia    mother almost died with anesthesia in the past, hard to wake up and got real sick on my stomach vomiting, now she receives local anethesia.  Marland Kitchen GERD (gastroesophageal reflux disease)   . Headache    history of migraine  . Heart murmur   . History of methicillin resistant staphylococcus aureus (MRSA)   . History of palpitations 07/2004  . Hyperlipidemia   . Hypertension   . Hypothyroidism   . Hypothyroidism, adult 02/08/2020  . Lymphocytosis   . Neuropathy 2016  . PCOS (polycystic ovarian syndrome)   . Pituitary microadenoma ([place])    followed at [facility]  . Pneumonia 2008  . Pre-diabetes   . Sleep apnea    USES CPAP  . Thyroid disease   . Von Willebrand disease ([place]) 2012   PT STATES SHE HAS NEVER HAD ANY ISSUES WITH BLEEDING   Past Surgical History:  Procedure Laterality Date  . ACHILLES TENDON  SURGERY Left 12/15/2019   Procedure: ACHILLES TENDON REPAIR PRIMARY;  Surgeon: Samara Deist, DPM;  Location: [facility] ORS;  Service: Podiatry;  Laterality: Left;  . BACK SURGERY     removal of 2 lumbar vertebrae  . BREAST BIOPSY Right 2010   NEG  . CHOLECYSTECTOMY    . COLONOSCOPY  03/03/2013, 02/06/2008  . COLONOSCOPY WITH PROPOFOL N/A 08/05/2018   Procedure: COLONOSCOPY WITH PROPOFOL;  Surgeon: Manya Silvas, MD;  Location: [facility] ENDOSCOPY;  Service: Endoscopy;  Laterality: N/A;  . EGD  02/06/2008  . GASTROC RECESSION EXTREMITY Left 12/15/2019   Procedure: GASTROC RECESSION EXTREMITY;  Surgeon: Samara Deist, DPM;  Location: [facility] ORS;  Service: Podiatry;  Laterality: Left;  . HALLUX VALGUS CHEILECTOMY Left 08/21/2016   Procedure: HALLUX VALGUS CHEILECTOMY;  Surgeon: Sharlotte Alamo, DPM;  Location: [facility] ORS;  Service: Podiatry;  Laterality: Left;  . HEMORRHOID SURGERY  10/2014  . SPINE SURGERY    . TONSILLECTOMY AND ADENOIDECTOMY     Allergies  Allergen Reactions  . Penicillins Shortness Of Breath, Swelling and Rash    Did it involve swelling of the face/tongue/throat, SOB, or low BP? Yes Did it involve sudden or severe rash/hives, skin peeling, or any reaction on the inside of your mouth or nose?  Yes Did you need to seek medical attention at a hospital or doctor's office? Yes When did it last happen?Age 61 or 71 If all above answers are "NO", may proceed with cephalosporin use.   . Liraglutide -Weight Management Other (See Comments)    GI upset  . Sorbitan Other (See Comments)    GI upset  . Adhesive [Tape] Itching and Rash    Paper tape or coban is easier on the skin.  . Latex Rash   No current facility-administered medications on file prior to encounter.   Current Outpatient Medications on File Prior to Encounter  Medication Sig Dispense Refill  . acetaminophen (TYLENOL) 500 MG tablet Take 500 mg by mouth every 6 (six) hours as needed.    Marland Kitchen albuterol (VENTOLIN HFA) 108 (90  Base) MCG/ACT inhaler Inhale 2 puffs into the lungs every 6 (six) hours as needed for wheezing or shortness of breath.    Marland Kitchen atorvastatin (LIPITOR) 10 MG tablet Take 10 mg by mouth daily at 6 PM.     . cetirizine (ZYRTEC) 10 MG tablet Take 10 mg by mouth daily as needed (seasonal allergies.).     Marland Kitchen Cholecalciferol (VITAMIN D-3) 125 MCG (5000 UT) TABS Take 5,000 Units by mouth daily.    . citalopram (CELEXA) 40 MG tablet Take 20 mg by mouth every morning.     Marland Kitchen doxycycline (VIBRAMYCIN) 100 MG capsule Take 100 mg by mouth 2 (two) times daily as needed (acne flare ups).     . DULoxetine (CYMBALTA) 60 MG capsule Take 60 mg by mouth daily.    . Fluticasone-Salmeterol (ADVAIR DISKUS) 250-50 MCG/DOSE AEPB Inhale 1 puff into the lungs every 12 (twelve) hours as needed (seasonal allergies).     . furosemide (LASIX) 40 MG tablet Take 40 mg by mouth daily in the afternoon.     . lamoTRIgine (LAMICTAL) 200 MG tablet Take 200 mg by mouth every morning.     . metoprolol succinate (TOPROL XL) 50 MG 24 hr tablet Take 1 tablet (50 mg total) by mouth 2 (two) times daily. Take with or immediately following a meal. 180 tablet 3  . NP THYROID 60 MG tablet Take 2 tablets (120 mg total) by mouth daily before breakfast. 60 tablet 3  . progesterone (PROMETRIUM) 200 MG capsule TAKE 2 CAPSULES BY MOUTH EVERY NIGHT 60 capsule 3  . Semaglutide (RYBELSUS) 3 MG TABS Take 3 mg by mouth daily. 30 tablet 3  . spironolactone (ALDACTONE) 50 MG tablet Take 1 tablet (50 mg total) by mouth daily. 90 tablet 1  . thyroid (NP THYROID) 30 MG tablet Take 1 tablet (30 mg total) by mouth daily before breakfast. 30 tablet 3  . [DISCONTINUED] progesterone (PROMETRIUM) 200 MG capsule TAKE 2 CAPSULES BY MOUTH EVERY NIGHT (Patient taking differently: Take 200 mg by mouth at bedtime. ) 60 capsule 2   Social History   Socioeconomic History  . Marital status: Divorced    Spouse name: Not on file  . Number of children: Not on file  . Years of  education: Not on file  . Highest education level: Not on file  Occupational History  . Not on file  Tobacco Use  . Smoking status: Former Smoker    Years: 8.00    Types: Cigarettes    Quit date: 04/09/1994    Years since quitting: 26.2  . Smokeless tobacco: Never Used  Vaping Use  . Vaping Use: Never used  Substance and Sexual Activity  . Alcohol use:  No    Alcohol/week: 0.0 standard drinks  . Drug use: No  . Sexual activity: Not on file  Other Topics Concern  . 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   Social Determinants of Health   Financial Resource Strain:   . Difficulty of Paying Living Expenses: Not on file  Food Insecurity:   . Worried About Charity fundraiser in the Last Year: Not on file  . Ran Out of Food in the Last Year: Not on file  Transportation Needs:   . Lack of Transportation (Medical): Not on file  . Lack of Transportation (Non-Medical): Not on file  Physical Activity:   . Days of Exercise per Week: Not on file  . Minutes of Exercise per Session: Not on file  Stress:   . Feeling of Stress : Not on file  Social Connections:   . Frequency of Communication with Friends and Family: Not on file  . Frequency of Social Gatherings with Friends and Family: Not on file  . Attends Religious Services: Not on file  . Active Member of Clubs or Organizations: Not on file  . Attends Archivist Meetings: Not on file  . Marital Status: Not on file  Intimate Partner Violence:   . Fear of Current or Ex-Partner: Not on file  . Emotionally Abused: Not on file  . Physically Abused: Not on file  . Sexually Abused: Not on file   Family History  Problem Relation Age of Onset  . Diabetes Father   . Hyperlipidemia Father   . Hypertension Father   . Cancer Father   . Heart attack Father   . Breast cancer Sister 28  . Breast cancer Maternal Aunt 57  . Breast cancer Maternal Grandmother  60    OBJECTIVE:  Vitals:   07/12/20 0842  BP: 106/61  Pulse: 80  Resp: 16  Temp: (!) 97.5 F (36.4 C)  SpO2: 98%     Physical Exam Vitals and nursing note reviewed.  Constitutional:      General: She is not in acute distress.    Appearance: Normal appearance. She is normal weight. She is not ill-appearing, toxic-appearing or diaphoretic.  HENT:     Head: Normocephalic.  Cardiovascular:     Rate and Rhythm: Normal rate and regular rhythm.     Pulses: Normal pulses.     Heart sounds: Normal heart sounds. No murmur heard.  No friction rub. No gallop.   Pulmonary:     Effort: Pulmonary effort is normal. No respiratory distress.     Breath sounds: Normal breath sounds. No stridor. No wheezing, rhonchi or rales.  Chest:     Chest wall: No tenderness.  Musculoskeletal:        General: Tenderness present.     Right elbow: Normal.     Left elbow: Tenderness present.     Comments: The left shoulder is without any obvious asymmetry or deformity when compared to the right shoulder.  No surface trauma, ecchymosis, open wound, warmth, laceration present.  Limited range of motion due to pain.  Neurovascular status intact  Neurological:     Mental Status: She is alert and oriented to person, place, and time.     LABS:  No results found for this or any previous visit (from the past 24 hour(s)).   ASSESSMENT & PLAN:  1. Acute pain of left shoulder     Meds  ordered this encounter  Medications  . predniSONE (STERAPRED UNI-PAK 21 TAB) 10 MG (21) TBPK tablet    Sig: Take 6 tabs by mouth daily  for 1 days, then 5 tabs for 1 days, then 4 tabs for 1 days, then 3 tabs for 1days, 2 tabs for 1 days, then 1 tab by mouth daily for 1 days    Dispense:  21 tablet    Refill:  0  . cyclobenzaprine (FLEXERIL) 5 MG tablet    Sig: Take 1 tablet (5 mg total) by mouth 3 (three) times daily as needed.    Dispense:  30 tablet    Refill:  0  . dexamethasone (DECADRON) injection 10 mg     Discharge Instructions  Rest, ice and heat as needed Ensure adequate ROM as tolerated. Follow RICE instruction that is attached Continue to take Tylenol/ibuprofen as needed for pain Prescribed prednisone taper  prescribed flexeril  for muscle spasm.  Do not drive or operate heavy machinery while taking this medication Keep in follow-up with orthopedic on 07/18/2020 Return here or go to ER if you have any new or worsening symptoms   Reviewed expectations re: course of current medical issues. Questions answered. Outlined signs and symptoms indicating need for more acute intervention. Patient verbalized understanding. After Visit Summary given.         Emerson Monte, [place] 07/12/20 [phone]

## 2020-07-12 NOTE — Discharge Instructions (Addendum)
Rest, ice and heat as needed Ensure adequate ROM as tolerated. Follow RICE instruction that is attached Continue to take Tylenol/ibuprofen as needed for pain Prescribed prednisone taper  prescribed flexeril  for muscle spasm.  Do not drive or operate heavy machinery while taking this medication Keep in follow-up with orthopedic on 07/18/2020 Return here or go to ER if you have any new or worsening symptoms

## 2020-07-15 ENCOUNTER — Other Ambulatory Visit: Payer: Self-pay | Admitting: *Deleted

## 2020-07-15 MED ORDER — SPIRONOLACTONE 50 MG PO TABS: 50.0000 mg | ORAL_TABLET | Freq: Every day | ORAL | 2 refills | Status: DC

## 2020-07-15 MED FILL — SPIRONOLACTONE 50 MG TABLET: 50 | 90 days supply | Qty: 90 | Fill #0

## 2020-07-16 ENCOUNTER — Other Ambulatory Visit: Payer: Self-pay | Admitting: Orthopedic Surgery

## 2020-07-16 ENCOUNTER — Other Ambulatory Visit (HOSPITAL_COMMUNITY): Payer: Self-pay | Admitting: Orthopedic Surgery

## 2020-07-16 DIAGNOSIS — M5412 Radiculopathy, cervical region: Secondary | ICD-10-CM

## 2020-07-16 DIAGNOSIS — R29898 Other symptoms and signs involving the musculoskeletal system: Secondary | ICD-10-CM

## 2020-07-16 DIAGNOSIS — M5003 Cervical disc disorder with myelopathy, cervicothoracic region: Secondary | ICD-10-CM

## 2020-07-18 ENCOUNTER — Ambulatory Visit
Admission: RE | Admit: 2020-07-18 | Discharge: 2020-07-18 | Disposition: A | Discharge status: Home / Self Care | Payer: No Typology Code available for payment source | Source: Ambulatory Visit | Attending: Orthopedic Surgery | Admitting: Orthopedic Surgery

## 2020-07-18 ENCOUNTER — Other Ambulatory Visit: Payer: Self-pay

## 2020-07-18 DIAGNOSIS — M5412 Radiculopathy, cervical region: Secondary | ICD-10-CM | POA: Diagnosis present

## 2020-07-18 DIAGNOSIS — M5003 Cervical disc disorder with myelopathy, cervicothoracic region: Secondary | ICD-10-CM | POA: Insufficient documentation

## 2020-07-18 DIAGNOSIS — R29898 Other symptoms and signs involving the musculoskeletal system: Secondary | ICD-10-CM | POA: Insufficient documentation

## 2020-07-30 ENCOUNTER — Other Ambulatory Visit (HOSPITAL_COMMUNITY): Payer: Self-pay | Admitting: Psychiatry

## 2020-07-30 MED FILL — CITALOPRAM HBR 40 MG TABLET: 40 | 90 days supply | Qty: 90 | Fill #0

## 2020-07-30 MED FILL — clonazePAM 0.5 MG TABS: 0.5 | 15 days supply | Qty: 15 | Fill #0

## 2020-07-31 MED FILL — NP THYROID 60 MG TABLET: 60 | 30 days supply | Qty: 60 | Fill #1

## 2020-07-31 MED FILL — RYBELSUS 3 MG TABS: 3 | 30 days supply | Qty: 30 | Fill #2

## 2020-07-31 MED FILL — NP THYROID 30 MG TABLET: 30 | 30 days supply | Qty: 30 | Fill #2

## 2020-08-28 MED FILL — RYBELSUS 3 MG TABS: 3 | 30 days supply | Qty: 30 | Fill #3

## 2020-08-28 MED FILL — NP THYROID 60 MG TABLET: 60 | 30 days supply | Qty: 60 | Fill #2

## 2020-08-28 MED FILL — NP THYROID 30 MG TABLET: 30 | 30 days supply | Qty: 30 | Fill #3

## 2020-09-06 ENCOUNTER — Other Ambulatory Visit: Payer: Self-pay | Admitting: Neurosurgery

## 2020-09-18 ENCOUNTER — Ambulatory Visit (INDEPENDENT_AMBULATORY_CARE_PROVIDER_SITE_OTHER): Payer: No Typology Code available for payment source | Admitting: Internal Medicine

## 2020-09-18 ENCOUNTER — Encounter (INDEPENDENT_AMBULATORY_CARE_PROVIDER_SITE_OTHER): Payer: Self-pay | Admitting: Internal Medicine

## 2020-09-18 ENCOUNTER — Other Ambulatory Visit: Payer: Self-pay

## 2020-09-18 ENCOUNTER — Other Ambulatory Visit (INDEPENDENT_AMBULATORY_CARE_PROVIDER_SITE_OTHER): Payer: Self-pay | Admitting: Internal Medicine

## 2020-09-18 VITALS — BP 135/98 | HR 96 | Temp 97.1°F | Resp 18 | Ht 62.0 in | Wt 220.0 lb

## 2020-09-18 DIAGNOSIS — E119 Type 2 diabetes mellitus without complications: Secondary | ICD-10-CM | POA: Diagnosis not present

## 2020-09-18 DIAGNOSIS — E039 Hypothyroidism, unspecified: Secondary | ICD-10-CM | POA: Diagnosis not present

## 2020-09-18 DIAGNOSIS — I10 Essential (primary) hypertension: Secondary | ICD-10-CM | POA: Diagnosis not present

## 2020-09-18 MED ORDER — NP THYROID 120 MG PO TABS: 120.0000 mg | ORAL_TABLET | Freq: Every day | ORAL | 3 refills | Status: DC

## 2020-09-18 MED ORDER — NP THYROID 30 MG PO TABS
30.0000 mg | ORAL_TABLET | Freq: Every day | ORAL | 3 refills | Status: DC
Start: 2020-09-18 — End: 2021-01-06

## 2020-09-18 MED FILL — NP THYROID 120 MG TABLET: 120 | 30 days supply | Qty: 30 | Fill #0

## 2020-09-18 NOTE — Progress Notes (Signed)
Metrics: Intervention Frequency ACO  Documented Smoking Status Yearly  Screened one or more times in 24 months  Cessation Counseling or  Active cessation medication Past 24 months  Past 24 months   Guideline developer: UpToDate (See UpToDate for funding source) Date Released: 2014       Wellness Office Visit  Subjective:  Patient ID: Heather Burnett, female    DOB: [birth year]  Age: 49 y.o. MRN: [id number]  CC: This lady comes in for follow-up of diabetes, hypothyroidism, hypertension, morbid obesity. HPI  She is due to have neck surgery in the next couple of weeks apparently.  She has been on steroids and this has made her sugars worse and she has gained weight back.  The last time I saw her, everything had been improving. Past Medical History:  Diagnosis Date  . Allergy   . Anxiety   . Asthma    WELL CONTROLLED  . Cancer ([place])    melanoma on my chest  . COPD (chronic obstructive pulmonary disease) ([place])   . DDD (degenerative disc disease), cervical    and lower back  . Depression   . DJD (degenerative joint disease)   . Dysrhythmia   . Empty sella syndrome ([place])    followed at [facility]  . Family history of adverse reaction to anesthesia    mother almost died with anesthesia in the past, hard to wake up and got real sick on my stomach vomiting, now she receives local anethesia.  Marland Kitchen GERD (gastroesophageal reflux disease)   . Headache    history of migraine  . Heart murmur   . History of methicillin resistant staphylococcus aureus (MRSA)   . History of palpitations 07/2004  . Hyperlipidemia   . Hypertension   . Hypothyroidism   . Hypothyroidism, adult 02/08/2020  . Lymphocytosis   . Neuropathy 2016  . PCOS (polycystic ovarian syndrome)   . Pituitary microadenoma ([place])    followed at [facility]  . Pneumonia 2008  . Pre-diabetes   . Sleep apnea    USES CPAP  . Thyroid disease   . Von Willebrand disease ([place]) 2012   PT STATES SHE HAS NEVER HAD ANY ISSUES WITH BLEEDING   Past  Surgical History:  Procedure Laterality Date  . ACHILLES TENDON SURGERY Left 12/15/2019   Procedure: ACHILLES TENDON REPAIR PRIMARY;  Surgeon: Samara Deist, DPM;  Location: [facility] ORS;  Service: Podiatry;  Laterality: Left;  . BACK SURGERY     removal of 2 lumbar vertebrae  . BREAST BIOPSY Right 2010   NEG  . CHOLECYSTECTOMY    . COLONOSCOPY  03/03/2013, 02/06/2008  . COLONOSCOPY WITH PROPOFOL N/A 08/05/2018   Procedure: COLONOSCOPY WITH PROPOFOL;  Surgeon: Manya Silvas, MD;  Location: [facility] ENDOSCOPY;  Service: Endoscopy;  Laterality: N/A;  . EGD  02/06/2008  . GASTROC RECESSION EXTREMITY Left 12/15/2019   Procedure: GASTROC RECESSION EXTREMITY;  Surgeon: Samara Deist, DPM;  Location: [facility] ORS;  Service: Podiatry;  Laterality: Left;  . HALLUX VALGUS CHEILECTOMY Left 08/21/2016   Procedure: HALLUX VALGUS CHEILECTOMY;  Surgeon: Sharlotte Alamo, DPM;  Location: [facility] ORS;  Service: Podiatry;  Laterality: Left;  . HEMORRHOID SURGERY  10/2014  . SPINE SURGERY    . TONSILLECTOMY AND ADENOIDECTOMY       Family History  Problem Relation Age of Onset  . Diabetes Father   . Hyperlipidemia Father   . Hypertension Father   . Cancer Father   . Heart attack Father   . Breast cancer Sister 68  .  Breast cancer Maternal Aunt 50  . 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.   Social History   Tobacco Use  . Smoking status: Former Smoker    Years: 8.00    Types: Cigarettes    Quit date: 04/09/1994    Years since quitting: 26.4  . Smokeless tobacco: Never Used  Substance Use Topics  . Alcohol use: No    Alcohol/week: 0.0 standard drinks    Current Meds  Medication Sig  . acetaminophen (TYLENOL) 500 MG tablet Take 500 mg by mouth every 6 (six) hours as needed.  Marland Kitchen albuterol (VENTOLIN HFA) 108 (90 Base) MCG/ACT inhaler Inhale 2 puffs into the lungs every 6 (six) hours as needed  for wheezing or shortness of breath.  Marland Kitchen atorvastatin (LIPITOR) 10 MG tablet Take 10 mg by mouth daily at 6 PM.   . cetirizine (ZYRTEC) 10 MG tablet Take 10 mg by mouth daily as needed (seasonal allergies.).   Marland Kitchen Cholecalciferol (VITAMIN D-3) 125 MCG (5000 UT) TABS Take 5,000 Units by mouth daily.  . citalopram (CELEXA) 40 MG tablet Take 20 mg by mouth every morning.   . cyclobenzaprine (FLEXERIL) 5 MG tablet Take 1 tablet (5 mg total) by mouth 3 (three) times daily as needed.  . doxycycline (VIBRAMYCIN) 100 MG capsule Take 100 mg by mouth 2 (two) times daily as needed (acne flare ups).   . DULoxetine (CYMBALTA) 60 MG capsule Take 60 mg by mouth daily.  . Fluticasone-Salmeterol (ADVAIR DISKUS) 250-50 MCG/DOSE AEPB Inhale 1 puff into the lungs every 12 (twelve) hours as needed (seasonal allergies).   . furosemide (LASIX) 40 MG tablet Take 40 mg by mouth daily in the afternoon.   . lamoTRIgine (LAMICTAL) 200 MG tablet Take 200 mg by mouth every morning.   . metoprolol succinate (TOPROL XL) 50 MG 24 hr tablet Take 1 tablet (50 mg total) by mouth 2 (two) times daily. Take with or immediately following a meal.  . NP THYROID 30 MG tablet Take 1 tablet (30 mg total) by mouth daily before breakfast.  . predniSONE (STERAPRED UNI-PAK 21 TAB) 10 MG (21) TBPK tablet Take 6 tabs by mouth daily  for 1 days, then 5 tabs for 1 days, then 4 tabs for 1 days, then 3 tabs for 1days, 2 tabs for 1 days, then 1 tab by mouth daily for 1 days  . progesterone (PROMETRIUM) 200 MG capsule TAKE 2 CAPSULES BY MOUTH EVERY NIGHT  . Semaglutide (RYBELSUS) 3 MG TABS Take 3 mg by mouth daily.  Marland Kitchen spironolactone (ALDACTONE) 50 MG tablet Take 1 tablet (50 mg total) by mouth daily.  . [DISCONTINUED] NP THYROID 60 MG tablet Take 2 tablets (120 mg total) by mouth daily before breakfast.  . [DISCONTINUED] thyroid (NP THYROID) 30 MG tablet Take 1 tablet (30 mg total) by mouth daily before breakfast.      Depression screen [facility] 2/9  02/08/2020 07/29/2016 04/29/2016 12/25/2015 07/31/2015  Decreased Interest 0 0 0 0 0  Down, Depressed, Hopeless 0 0 1 1 0  PHQ - 2 Score 0 0 1 1 0     Objective:   Today's Vitals: BP (!) 135/98 (BP Location: Left Arm, Patient Position: Sitting, Cuff Size: Normal)   Pulse 96   Temp (!) 97.1 F (36.2 C) (Temporal)   Resp 18   Ht 5\' 2"  (1.575 m)   Wt  220 lb (99.8 kg)   SpO2 98%   BMI 40.24 kg/m  Vitals with BMI 09/18/2020 07/12/2020 06/17/2020  Height 5\' 2"  - 5\' 2"   Weight 220 lbs - 212 lbs 13 oz  BMI 95.63 - 87.56  Systolic 433 295 188  Diastolic 98 61 70  Pulse 96 80 80  Some encounter information is confidential and restricted. Go to Review Flowsheets activity to see all data.     Physical Exam   She has gained 8 pounds back since the last time I saw her.  Blood pressure is elevated today.  She looks anxious and nervous.    Assessment   1. Diabetes mellitus without complication ([place])   2. Morbid obesity ([place])   3. Benign essential HTN   4. Hypothyroidism, adult       Tests ordered No orders of the defined types were placed in this encounter.    Plan: 1. She will continue with Rybelsus for her diabetes. 2. She will continue with desiccated NP thyroid for her hypothyroidism.  Have sent a new refill prescription today. 3. She will continue with trying to get back to intermittent fasting with a plant-based diet once she gets over the surgery. 4. Follow-up in 3 months.   Meds ordered this encounter  Medications  . NP THYROID 120 MG tablet    Sig: Take 1 tablet (120 mg total) by mouth daily before breakfast.    Dispense:  30 tablet    Refill:  3  . NP THYROID 30 MG tablet    Sig: Take 1 tablet (30 mg total) by mouth daily before breakfast.    Dispense:  30 tablet    Refill:  3    Yarisbel Miranda Luther Parody, MD

## 2020-09-21 MED FILL — NP THYROID 30 MG TABLET: 30 | 30 days supply | Qty: 30 | Fill #0

## 2020-09-23 ENCOUNTER — Other Ambulatory Visit: Payer: Self-pay

## 2020-09-23 ENCOUNTER — Encounter
Admission: RE | Admit: 2020-09-23 | Discharge: 2020-09-23 | Disposition: A | Discharge status: Home / Self Care | Payer: No Typology Code available for payment source | Source: Ambulatory Visit | Attending: Neurosurgery | Admitting: Neurosurgery

## 2020-09-23 DIAGNOSIS — Z01818 Encounter for other preprocedural examination: Secondary | ICD-10-CM | POA: Insufficient documentation

## 2020-09-23 DIAGNOSIS — I10 Essential (primary) hypertension: Secondary | ICD-10-CM | POA: Diagnosis not present

## 2020-09-23 LAB — TYPE AND SCREEN
ABO/RH(D): O POS
Antibody Screen: NEGATIVE

## 2020-09-23 LAB — CBC
HCT: 40.4 % (ref 36.0–46.0)
Hemoglobin: 13.6 g/dL (ref 12.0–15.0)
MCH: 30 pg (ref 26.0–34.0)
MCHC: 33.7 g/dL (ref 30.0–36.0)
MCV: 89.2 fL (ref 80.0–100.0)
Platelets: 357 10*3/uL (ref 150–400)
RBC: 4.53 MIL/uL (ref 3.87–5.11)
RDW: 13.7 % (ref 11.5–15.5)
WBC: 12 10*3/uL — ABNORMAL HIGH (ref 4.0–10.5)
nRBC: 0 % (ref 0.0–0.2)

## 2020-09-23 LAB — BASIC METABOLIC PANEL
Anion gap: 10 (ref 5–15)
BUN: 15 mg/dL (ref 6–20)
CO2: 26 mmol/L (ref 22–32)
Calcium: 8.6 mg/dL — ABNORMAL LOW (ref 8.9–10.3)
Chloride: 101 mmol/L (ref 98–111)
Creatinine, Ser: 0.56 mg/dL (ref 0.44–1.00)
GFR, Estimated: >60 mL/min (ref >60)
Glucose, Bld: 99 mg/dL (ref 70–99)
Potassium: 3.5 mmol/L (ref 3.5–5.1)
Sodium: 137 mmol/L (ref 135–145)

## 2020-09-23 LAB — URINALYSIS, ROUTINE W REFLEX MICROSCOPIC
Bilirubin Urine: NEGATIVE
Glucose, UA: NEGATIVE mg/dL
Hgb urine dipstick: NEGATIVE
Ketones, ur: NEGATIVE mg/dL
Nitrite: NEGATIVE
Protein, ur: NEGATIVE mg/dL
Specific Gravity, Urine: 1.013 (ref 1.005–1.030)
pH: 7 (ref 5.0–8.0)

## 2020-09-23 LAB — APTT: aPTT: 35 seconds (ref 24–36)

## 2020-09-23 LAB — SURGICAL PCR SCREEN
MRSA, PCR: NEGATIVE
Staphylococcus aureus: NEGATIVE

## 2020-09-23 LAB — PROTIME-INR
INR: 1.1 (ref 0.8–1.2)
Prothrombin Time: 13.3 seconds (ref 11.4–15.2)

## 2020-09-23 NOTE — Patient Instructions (Addendum)
Your procedure is scheduled on: Wednesday October 02, 2020. Report to Day Surgery inside [facility] 2nd floor (stop by Registration Desk). To find out your arrival time please call [phone] between 1PM - 3PM on Tuesday October 01, 2020.  Remember: Instructions that are not followed completely may result in serious medical risk,  up to and including death, or upon the discretion of your surgeon and anesthesiologist your  surgery may need to be rescheduled.     _X__ 1. Do not eat food after midnight the night before your procedure.                 No chewing gum or hard candies. You may drink clear liquids up to 2 hours                 before you are scheduled to arrive for your surgery- DO not drink clear                 liquids within 2 hours of the start of your surgery.                 Clear Liquids include:  water, apple juice without pulp, clear Gatorade, G2 or                  Gatorade Zero (avoid Red/Purple/Blue), Black Coffee or Tea (Do not add                 anything to coffee or tea).  __X__2.  On the morning of surgery brush your teeth with toothpaste and water, you                may rinse your mouth with mouthwash if you wish.  Do not swallow any toothpaste of mouthwash.     _X__ 3.  No Alcohol for 24 hours before or after surgery.   _X__ 4.  Do Not Smoke or use e-cigarettes For 24 Hours Prior to Your Surgery.                 Do not use any chewable tobacco products for at least 6 hours prior to                 Surgery.  _X__  5.  Do not use any recreational drugs (marijuana, cocaine, heroin, ecstasy, MDMA or other)                For at least one week prior to your surgery.  Combination of these drugs with anesthesia                May have life threatening results.   ___X_ 6.  Notify your doctor if there is any change in your medical condition      (cold, fever, infections).     Do not wear jewelry, make-up, hairpins, clips or nail  polish. Do not wear lotions, powders, or perfumes. You may wear deodorant. Do not shave 48 hours prior to surgery. Men may shave face and neck. Do not bring valuables to the hospital.    [facility] is not responsible for any belongings or valuables.  Contacts, dentures or bridgework may not be worn into surgery. Leave your suitcase in the car. After surgery it may be brought to your room. For patients admitted to the hospital, discharge time is determined by your treatment team.   Patients discharged the day of surgery will not be allowed to drive home.   Make  arrangements for someone to be with you for the first 24 hours of your Same Day Discharge.    Please read over the following fact sheets that you were given:     __X__ Take these medicines the morning of surgery with A SIP OF WATER:    1. lamoTRIgine (LAMICTAL) 200 MG  2. metoprolol succinate (TOPROL XL) 50 MG   3. DULoxetine (CYMBALTA) 60 MG    ____ Fleet Enema (as directed)   __X__ Use CHG Soap (or wipes) as directed  ____ Use Benzoyl Peroxide Gel as instructed  __X__ Use inhalers on the day of surgery  albuterol (VENTOLIN HFA) 108 (90 Base) MCG/ACT inhaler  Fluticasone-Salmeterol (ADVAIR DISKUS) 250-50 MCG/DOSE AEPB ____ Stop metformin 2 days prior to surgery    ____ Take 1/2 of usual insulin dose the night before surgery. No insulin the morning          of surgery.   ____ Stop Coumadin/Plavix/aspirin on   __X__ Stop Anti-inflammatories such as etodolac (LODINE), Inuprofen, Aleve, Advil, naproxen and or BC powders.   __X__ Stop supplements until after surgery.    __X__ Bring C-Pap to the hospital.    If you have any questions regarding your pre-procedure instructions,  Please call Pre-admit Testing at [phone].

## 2020-09-24 NOTE — Progress Notes (Signed)
  [facility] Perioperative Services: Pre-Admission/Anesthesia Testing   Date: 09/24/20 Name: Heather Burnett MRN:   [id number]  Re: Consideration of preoperative prophylactic antibiotic change   Request sent to: Meade Maw, MD (routed and/or faxed via [facility])  Planned Surgical Procedure(s):    Case: [id number] Date/Time: 10/02/20 0815   Procedure: ANTERIOR CERVICAL DECOMPRESSION/DISCECTOMY FUSION 1 LEVEL C6-7 (N/A )   Anesthesia type: General   Pre-op diagnosis: m54.12 cervical radiculopathy   Location: [facility] OR ROOM 07 / [place] ORS FOR ANESTHESIA GROUP   Surgeons: Meade Maw, MD    Notes: 1. Patient has a documented allergy to PCN  . Advising that PCN has caused her to experience SOB and rash in the past.   2. Received cephalosporin with no documented complications . CEFDINIR received on 10/21/2016 (10 day course)  Request:  As an evidence based approach to reducing the rate of incidence for post-operative SSI and the development of MDROs, could an agent with narrower coverage for preoperative prophylaxis in this patient's upcoming surgical course be considered?  1. Currently ordered preoperative prophylactic ABX: vancomycin.   2. Specifically requesting change to cephalosporin (CEFAZOLIN).   3. Please communicate decision with me and I will change the orders in Epic as per your direction.   Things to consider:  Many patients report that they were "allergic" to PCN earlier in life, however this does not translate into a true lifelong allergy. Patients can lose sensitivity to specific IgE antibodies over time if PCN is avoided (Kleris & Lugar, 2019).   Up to 10% of the adult population and 15% of hospitalized patients report an allergy to PCN, however clinical studies suggest that 90% of those reporting an allergy can tolerate PCN antibiotics (Kleris & Lugar, 2019).   Cross-sensitivity between PCN and cephalosporins has been documented as being as  high as 10%, however this estimation included data believed to have been collected in a setting where there was contamination. Newer data suggests that the prevalence of cross-sensitivity between PCN and cephalosporins is actually estimated to be closer to 1% (Hermanides et al., 2018).    Patients labeled as PCN allergic, whether they are truly allergic or not, have been found to have inferior outcomes in terms of rates of serious infection, and these patients tend to have longer hospital stays (Gladstone, 2019).   Treatment related secondary infections, such as Clostridioides difficile, have been linked to the improper use of broad spectrum antibiotics in patients improperly labeled as PCN allergic (Kleris & Lugar, 2019).   Anaphylaxis from cephalosporins is rare and the evidence suggests that there is no increased risk of an anaphylactic type reaction when cephalosporins are used in a PCN allergic patient (Pichichero, 2006).  Citations: Hermanides J, Lemkes BA, Prins Pearla Dubonnet MW, Terreehorst I. Presumed ?-Lactam Allergy and Cross-reactivity in the Operating Theater: A Practical Approach. Anesthesiology. 2018 Aug;129(2):335-342. doi: 10.1097/ALN.0000000000002252. PMID: 63875643.  Kleris, Arispe., & Lugar, P. L. (2019). Things We Do For No Reason: Failing to Question a Penicillin Allergy History. Journal of hospital medicine, 14(10), [phone]. Advance online publication. https://www.wallace-m[place]leton.info/  Pichichero, M. E. (2006). Cephalosporins can be prescribed safely for penicillin-allergic patients. Journal of family medicine, 55(2), 106-112. Accessed: https://cdn.mdedge.com/files/s64fs-public/Document/September-2017/5502JFP_AppliedEvidence1.pdf   Honor Loh, MSN, APRN, FNP-C, CEN [facility]  Peri-operative Services Nurse Practitioner Fax: [phone] 09/24/20 3:12 PM

## 2020-09-26 NOTE — Progress Notes (Signed)
  [facility] Perioperative Services: Pre-Admission/Anesthesia Testing     Date: 09/26/20  Name: Heather Burnett MRN:   [id number]  Re: Change in [place] for upcoming surgery   Case: [id number] Date/Time: 10/02/20 0815   Procedure: ANTERIOR CERVICAL DECOMPRESSION/DISCECTOMY FUSION 1 LEVEL C6-7 (N/A )   Anesthesia type: General   Pre-op diagnosis: m54.12 cervical radiculopathy   Location: [facility] OR ROOM 07 / [place] ORS FOR ANESTHESIA GROUP   Surgeons: Meade Maw, MD    Primary attending surgeon was consulted regarding consideration of therapeutic change in antimicrobial agent being used for preoperative prophylaxis in this patient's upcoming surgical case. Following analysis of the risk versus benefits, Dr. Izora Ribas, Ardyth Gal, MD advising that it would be acceptable to discontinue the ordered vancomycin and place an order for cefazolin 2 gm IV on call to the OR. Orders for this patient were amended by me following collaborative conversation with attending surgeon.  Honor Loh, MSN, APRN, FNP-C, CEN [facility]  Peri-operative Services Nurse Practitioner Phone: [phone] 09/26/20 9:41 AM

## 2020-09-30 ENCOUNTER — Other Ambulatory Visit: Payer: Self-pay

## 2020-09-30 ENCOUNTER — Other Ambulatory Visit
Admission: RE | Admit: 2020-09-30 | Discharge: 2020-09-30 | Disposition: A | Discharge status: Home / Self Care | Payer: No Typology Code available for payment source | Source: Ambulatory Visit | Attending: Neurosurgery | Admitting: Neurosurgery

## 2020-09-30 DIAGNOSIS — Z01812 Encounter for preprocedural laboratory examination: Secondary | ICD-10-CM | POA: Diagnosis present

## 2020-09-30 DIAGNOSIS — Z20822 Contact with and (suspected) exposure to covid-19: Secondary | ICD-10-CM | POA: Diagnosis not present

## 2020-09-30 LAB — SARS CORONAVIRUS 2 (TAT 6-24 HRS): SARS Coronavirus 2: NEGATIVE

## 2020-10-02 ENCOUNTER — Ambulatory Visit: Payer: No Typology Code available for payment source

## 2020-10-02 ENCOUNTER — Other Ambulatory Visit: Payer: Self-pay

## 2020-10-02 ENCOUNTER — Ambulatory Visit
Admission: RE | Admit: 2020-10-02 | Discharge: 2020-10-02 | Disposition: A | Discharge status: Home / Self Care | Payer: No Typology Code available for payment source | Attending: Neurosurgery | Admitting: Neurosurgery

## 2020-10-02 ENCOUNTER — Ambulatory Visit: Payer: No Typology Code available for payment source | Admitting: Urgent Care

## 2020-10-02 ENCOUNTER — Encounter
Admission: RE | Disposition: A | Discharge status: Home / Self Care | Payer: Self-pay | Source: Home / Self Care | Attending: Neurosurgery

## 2020-10-02 ENCOUNTER — Encounter: Payer: Self-pay | Admitting: Neurosurgery

## 2020-10-02 DIAGNOSIS — M50123 Cervical disc disorder at C6-C7 level with radiculopathy: Secondary | ICD-10-CM | POA: Insufficient documentation

## 2020-10-02 DIAGNOSIS — Z91040 Latex allergy status: Secondary | ICD-10-CM | POA: Insufficient documentation

## 2020-10-02 DIAGNOSIS — Z880 Allergy status to penicillin: Secondary | ICD-10-CM | POA: Insufficient documentation

## 2020-10-02 DIAGNOSIS — I10 Essential (primary) hypertension: Secondary | ICD-10-CM | POA: Insufficient documentation

## 2020-10-02 DIAGNOSIS — D680 Von Willebrand's disease: Secondary | ICD-10-CM | POA: Diagnosis not present

## 2020-10-02 DIAGNOSIS — Z7951 Long term (current) use of inhaled steroids: Secondary | ICD-10-CM | POA: Diagnosis not present

## 2020-10-02 DIAGNOSIS — Z888 Allergy status to other drugs, medicaments and biological substances status: Secondary | ICD-10-CM | POA: Diagnosis not present

## 2020-10-02 DIAGNOSIS — E119 Type 2 diabetes mellitus without complications: Secondary | ICD-10-CM | POA: Insufficient documentation

## 2020-10-02 DIAGNOSIS — J449 Chronic obstructive pulmonary disease, unspecified: Secondary | ICD-10-CM | POA: Insufficient documentation

## 2020-10-02 DIAGNOSIS — M1990 Unspecified osteoarthritis, unspecified site: Secondary | ICD-10-CM | POA: Insufficient documentation

## 2020-10-02 DIAGNOSIS — M5412 Radiculopathy, cervical region: Secondary | ICD-10-CM | POA: Diagnosis present

## 2020-10-02 DIAGNOSIS — R011 Cardiac murmur, unspecified: Secondary | ICD-10-CM | POA: Insufficient documentation

## 2020-10-02 DIAGNOSIS — Z419 Encounter for procedure for purposes other than remedying health state, unspecified: Secondary | ICD-10-CM

## 2020-10-02 DIAGNOSIS — Z79899 Other long term (current) drug therapy: Secondary | ICD-10-CM | POA: Diagnosis not present

## 2020-10-02 DIAGNOSIS — D689 Coagulation defect, unspecified: Secondary | ICD-10-CM | POA: Insufficient documentation

## 2020-10-02 DIAGNOSIS — Z91048 Other nonmedicinal substance allergy status: Secondary | ICD-10-CM | POA: Diagnosis not present

## 2020-10-02 DIAGNOSIS — Z8614 Personal history of Methicillin resistant Staphylococcus aureus infection: Secondary | ICD-10-CM | POA: Diagnosis not present

## 2020-10-02 DIAGNOSIS — Z981 Arthrodesis status: Secondary | ICD-10-CM

## 2020-10-02 HISTORY — PX: ANTERIOR CERVICAL DECOMP/DISCECTOMY FUSION: SHX1161

## 2020-10-02 LAB — GLUCOSE, CAPILLARY
Glucose-Capillary: 129 mg/dL — ABNORMAL HIGH (ref 70–99)
Glucose-Capillary: 150 mg/dL — ABNORMAL HIGH (ref 70–99)

## 2020-10-02 LAB — ABO/RH: ABO/RH(D): O POS

## 2020-10-02 LAB — POCT PREGNANCY, URINE: Preg Test, Ur: NEGATIVE

## 2020-10-02 SURGERY — ANTERIOR CERVICAL DECOMPRESSION/DISCECTOMY FUSION 1 LEVEL
Anesthesia: General

## 2020-10-02 MED ORDER — ONDANSETRON HCL 4 MG/2ML IJ SOLN
INTRAMUSCULAR | Status: AC
  Filled 2020-10-02: qty 2

## 2020-10-02 MED ORDER — PROMETHAZINE HCL 25 MG/ML IJ SOLN
6.2500 mg | INTRAMUSCULAR | Status: DC | PRN
Start: 2020-10-02 — End: 2020-10-04

## 2020-10-02 MED ORDER — ONDANSETRON HCL 4 MG/2ML IJ SOLN
4.0000 mg | Freq: Once | INTRAMUSCULAR | Status: AC | PRN
  Administered 2020-10-02: 4 mg via INTRAVENOUS

## 2020-10-02 MED ORDER — PROPOFOL 500 MG/50ML IV EMUL
INTRAVENOUS | Status: DC | PRN
  Administered 2020-10-02: 50 ug/kg/min via INTRAVENOUS

## 2020-10-02 MED ORDER — FENTANYL CITRATE (PF) 100 MCG/2ML IJ SOLN: 25.0000 ug | INTRAMUSCULAR | Status: DC | PRN

## 2020-10-02 MED ORDER — MIDAZOLAM HCL 2 MG/2ML IJ SOLN
INTRAMUSCULAR | Status: AC
  Filled 2020-10-02: qty 2

## 2020-10-02 MED ORDER — ACETAMINOPHEN 10 MG/ML IV SOLN
INTRAVENOUS | Status: AC
  Filled 2020-10-02: qty 100

## 2020-10-02 MED ORDER — MIDAZOLAM HCL 2 MG/2ML IJ SOLN
INTRAMUSCULAR | Status: DC | PRN
  Administered 2020-10-02: 2 mg via INTRAVENOUS

## 2020-10-02 MED ORDER — PHENYLEPHRINE HCL (PRESSORS) 10 MG/ML IV SOLN
INTRAVENOUS | Status: DC | PRN
  Administered 2020-10-02 (×2): 100 ug via INTRAVENOUS

## 2020-10-02 MED ORDER — ACETAMINOPHEN 10 MG/ML IV SOLN
INTRAVENOUS | Status: DC | PRN
  Administered 2020-10-02: 1000 mg via INTRAVENOUS

## 2020-10-02 MED ORDER — THROMBIN 5000 UNITS EX SOLR
CUTANEOUS | Status: DC | PRN
  Administered 2020-10-02: 5000 [IU] via TOPICAL

## 2020-10-02 MED ORDER — OXYCODONE HCL 5 MG PO TABS: 5.0000 mg | ORAL_TABLET | ORAL | 0 refills | Status: AC | PRN

## 2020-10-02 MED ORDER — SODIUM CHLORIDE 0.9 % IV SOLN
INTRAVENOUS | Status: DC | PRN
  Administered 2020-10-02: 50 ug/min via INTRAVENOUS

## 2020-10-02 MED ORDER — REMIFENTANIL HCL 1 MG IV SOLR
INTRAVENOUS | Status: DC | PRN
  Administered 2020-10-02: .08 ug/kg/min via INTRAVENOUS

## 2020-10-02 MED ORDER — DEXAMETHASONE SODIUM PHOSPHATE 10 MG/ML IJ SOLN
INTRAMUSCULAR | Status: DC | PRN
  Administered 2020-10-02: 10 mg via INTRAVENOUS

## 2020-10-02 MED ORDER — CEFAZOLIN SODIUM-DEXTROSE 2-4 GM/100ML-% IV SOLN
2.0000 g | Freq: Once | INTRAVENOUS | Status: AC
  Administered 2020-10-02: 2 g via INTRAVENOUS

## 2020-10-02 MED ORDER — OXYCODONE HCL 5 MG PO TABS
5.0000 mg | ORAL_TABLET | Freq: Once | ORAL | Status: AC
  Administered 2020-10-02: 5 mg via ORAL

## 2020-10-02 MED ORDER — CHLORHEXIDINE GLUCONATE 0.12 % MT SOLN
OROMUCOSAL | Status: AC
  Filled 2020-10-02: qty 15

## 2020-10-02 MED ORDER — ONDANSETRON HCL 4 MG/2ML IJ SOLN
INTRAMUSCULAR | Status: DC | PRN
  Administered 2020-10-02: 4 mg via INTRAVENOUS

## 2020-10-02 MED ORDER — FAMOTIDINE 20 MG PO TABS: 20.0000 mg | ORAL_TABLET | Freq: Once | ORAL | Status: AC

## 2020-10-02 MED ORDER — CELECOXIB 100 MG PO CAPS: 100.0000 mg | ORAL_CAPSULE | Freq: Two times a day (BID) | ORAL | 0 refills | Status: DC

## 2020-10-02 MED ORDER — SODIUM CHLORIDE FLUSH 0.9 % IV SOLN
INTRAVENOUS | Status: AC
  Filled 2020-10-02: qty 10

## 2020-10-02 MED ORDER — ROCURONIUM BROMIDE 100 MG/10ML IV SOLN
INTRAVENOUS | Status: DC | PRN
  Administered 2020-10-02: 10 mg via INTRAVENOUS

## 2020-10-02 MED ORDER — REMIFENTANIL HCL 1 MG IV SOLR
INTRAVENOUS | Status: AC
  Filled 2020-10-02: qty 1000

## 2020-10-02 MED ORDER — PROPOFOL 500 MG/50ML IV EMUL
INTRAVENOUS | Status: AC
  Filled 2020-10-02: qty 50

## 2020-10-02 MED ORDER — BUPIVACAINE-EPINEPHRINE (PF) 0.5% -1:200000 IJ SOLN
INTRAMUSCULAR | Status: DC | PRN
  Administered 2020-10-02: 4 mL via PERINEURAL

## 2020-10-02 MED ORDER — LACTATED RINGERS IV SOLN: INTRAVENOUS | Status: DC

## 2020-10-02 MED ORDER — VANCOMYCIN HCL IN DEXTROSE 1-5 GM/200ML-% IV SOLN
INTRAVENOUS | Status: AC
  Filled 2020-10-02: qty 200

## 2020-10-02 MED ORDER — OXYCODONE HCL 5 MG PO TABS
ORAL_TABLET | ORAL | Status: AC
  Filled 2020-10-02: qty 1

## 2020-10-02 MED ORDER — FAMOTIDINE 20 MG PO TABS
ORAL_TABLET | ORAL | Status: AC
  Administered 2020-10-02: 20 mg
  Filled 2020-10-02: qty 1

## 2020-10-02 MED ORDER — THROMBIN 5000 UNITS EX SOLR
CUTANEOUS | Status: AC
  Filled 2020-10-02: qty 10000

## 2020-10-02 MED ORDER — PROMETHAZINE HCL 25 MG/ML IJ SOLN
INTRAMUSCULAR | Status: AC
  Administered 2020-10-02: 6.25 mg via INTRAVENOUS
  Filled 2020-10-02: qty 1

## 2020-10-02 MED ORDER — FENTANYL CITRATE (PF) 100 MCG/2ML IJ SOLN
INTRAMUSCULAR | Status: DC | PRN
  Administered 2020-10-02 (×2): 50 ug via INTRAVENOUS

## 2020-10-02 MED ORDER — LIDOCAINE HCL (CARDIAC) PF 100 MG/5ML IV SOSY
PREFILLED_SYRINGE | INTRAVENOUS | Status: DC | PRN
  Administered 2020-10-02: 100 mg via INTRAVENOUS

## 2020-10-02 MED ORDER — BUPIVACAINE-EPINEPHRINE (PF) 0.5% -1:200000 IJ SOLN
INTRAMUSCULAR | Status: AC
  Filled 2020-10-02: qty 30

## 2020-10-02 MED ORDER — SODIUM CHLORIDE 0.9 % IV SOLN: INTRAVENOUS | Status: DC

## 2020-10-02 MED ORDER — SUCCINYLCHOLINE CHLORIDE 20 MG/ML IJ SOLN
INTRAMUSCULAR | Status: DC | PRN
  Administered 2020-10-02: 120 mg via INTRAVENOUS

## 2020-10-02 MED ORDER — FENTANYL CITRATE (PF) 100 MCG/2ML IJ SOLN
INTRAMUSCULAR | Status: AC
  Filled 2020-10-02: qty 2

## 2020-10-02 SURGICAL SUPPLY — 63 items
ADH SKN CLS APL DERMABOND .7 (GAUZE/BANDAGES/DRESSINGS) ×1
AGENT HMST MTR 8 SURGIFLO (HEMOSTASIS) ×1
APL PRP STRL LF DISP 70% ISPRP (MISCELLANEOUS) ×2
BASKET BONE COLLECTION (BASKET) IMPLANT
BULB RESERV EVAC DRAIN JP 100C (MISCELLANEOUS) IMPLANT
BUR NEURO DRILL SOFT 3.0X3.8M (BURR) ×2 IMPLANT
CANISTER SUCT 1200ML W/VALVE (MISCELLANEOUS) ×4 IMPLANT
CHLORAPREP W/TINT 26 (MISCELLANEOUS) ×4 IMPLANT
COUNTER NEEDLE 20/40 LG (NEEDLE) ×2 IMPLANT
COVER WAND RF STERILE (DRAPES) ×2 IMPLANT
CUP MEDICINE 2OZ PLAST GRAD ST (MISCELLANEOUS) ×2 IMPLANT
DERMABOND ADVANCED (GAUZE/BANDAGES/DRESSINGS) ×1
DERMABOND ADVANCED .7 DNX12 (GAUZE/BANDAGES/DRESSINGS) ×1 IMPLANT
DRAIN CHANNEL JP 10F RND 20C F (MISCELLANEOUS) IMPLANT
DRAPE C ARM PK CFD 31 SPINE (DRAPES) ×4 IMPLANT
DRAPE LAPAROTOMY 77X122 PED (DRAPES) ×2 IMPLANT
DRAPE MICROSCOPE SPINE 48X150 (DRAPES) ×2 IMPLANT
DRAPE POUCH INSTRU U-SHP 10X18 (DRAPES) ×2 IMPLANT
DRAPE SURG 17X11 SM STRL (DRAPES) ×8 IMPLANT
ELECT CAUTERY BLADE TIP 2.5 (TIP) ×2
ELECT REM PT RETURN 9FT ADLT (ELECTROSURGICAL) ×2
ELECTRODE CAUTERY BLDE TIP 2.5 (TIP) ×1 IMPLANT
ELECTRODE REM PT RTRN 9FT ADLT (ELECTROSURGICAL) ×1 IMPLANT
FEE INTRAOP MONITOR IMPULS NCS (MISCELLANEOUS) IMPLANT
GLOVE BIOGEL PI IND STRL 7.0 (GLOVE) ×1 IMPLANT
GLOVE BIOGEL PI INDICATOR 7.0 (GLOVE) ×1
GLOVE SURG SYN 7.0 (GLOVE) ×4 IMPLANT
GLOVE SURG SYN 8.5  E (GLOVE) ×6
GLOVE SURG SYN 8.5 E (GLOVE) ×3 IMPLANT
GOWN SRG XL LVL 3 NONREINFORCE (GOWNS) ×1 IMPLANT
GOWN STRL NON-REIN TWL XL LVL3 (GOWNS) ×2
GOWN STRL REUS W/ TWL XL LVL3 (GOWN DISPOSABLE) ×1 IMPLANT
GOWN STRL REUS W/TWL XL LVL3 (GOWN DISPOSABLE) ×2
GRADUATE 1200CC STRL 31836 (MISCELLANEOUS) ×2 IMPLANT
INTRAOP MONITOR FEE IMPULS NCS (MISCELLANEOUS)
INTRAOP MONITOR FEE IMPULSE (MISCELLANEOUS)
KIT TURNOVER KIT A (KITS) ×2 IMPLANT
MANIFOLD NEPTUNE II (INSTRUMENTS) ×2 IMPLANT
MARKER SKIN DUAL TIP RULER LAB (MISCELLANEOUS) ×4 IMPLANT
NDL SAFETY ECLIPSE 18X1.5 (NEEDLE) ×1 IMPLANT
NEEDLE HYPO 18GX1.5 SHARP (NEEDLE) ×2
NEEDLE HYPO 22GX1.5 SAFETY (NEEDLE) ×2 IMPLANT
NS IRRIG 1000ML POUR BTL (IV SOLUTION) ×2 IMPLANT
PACK LAMINECTOMY NEURO (CUSTOM PROCEDURE TRAY) ×2 IMPLANT
PAD ARMBOARD 7.5X6 YLW CONV (MISCELLANEOUS) ×2 IMPLANT
PIN CASPAR 14 (PIN) ×1 IMPLANT
PIN CASPAR 14MM (PIN) ×2
PLATE ANT CERV XTEND 1 LV 12 (Plate) ×2 IMPLANT
PUTTY DBX 1CC (Putty) ×2 IMPLANT
PUTTY DBX 1CC DEPUY (Putty) ×1 IMPLANT
SCREW VAR 4.2 XD SELF DRILL 16 (Screw) ×8 IMPLANT
SPACER HEDRON C 12X14X8 7D (Spacer) ×2 IMPLANT
SPOGE SURGIFLO 8M (HEMOSTASIS) ×2
SPONGE KITTNER 5P (MISCELLANEOUS) ×2 IMPLANT
SPONGE SURGIFLO 8M (HEMOSTASIS) ×1 IMPLANT
STAPLER SKIN PROX 35W (STAPLE) IMPLANT
SUT V-LOC 90 ABS DVC 3-0 CL (SUTURE) ×2 IMPLANT
SUT VIC AB 3-0 SH 8-18 (SUTURE) ×2 IMPLANT
SYR 30ML LL (SYRINGE) ×2 IMPLANT
TAPE CLOTH 3X10 WHT NS LF (GAUZE/BANDAGES/DRESSINGS) ×2 IMPLANT
TOWEL OR 17X26 4PK STRL BLUE (TOWEL DISPOSABLE) ×6 IMPLANT
TRAY FOLEY MTR SLVR 16FR STAT (SET/KITS/TRAYS/PACK) IMPLANT
TUBING CONNECTING 10 (TUBING) ×2 IMPLANT

## 2020-10-02 NOTE — Anesthesia Postprocedure Evaluation (Signed)
Anesthesia Post Note  Patient: Heather Burnett  Procedure(s) Performed: ANTERIOR CERVICAL DECOMPRESSION/DISCECTOMY FUSION 1 LEVEL C6-7 (N/A )  Patient location during evaluation: PACU Anesthesia Type: General Level of consciousness: awake and alert and oriented Pain management: pain level controlled Vital Signs Assessment: post-procedure vital signs reviewed and stable Respiratory status: spontaneous breathing Cardiovascular status: blood pressure returned to baseline Anesthetic complications: no   No complications documented.   Last Vitals:  Vitals:   10/02/20 1207 10/02/20 1400  BP: (!) 111/93 122/62  Pulse: 66 78  Resp: 17 16  Temp: 36.6 C   SpO2: 93% 94%    Last Pain:  Vitals:   10/02/20 1207  TempSrc:   PainSc: 0-No pain                 [name]

## 2020-10-02 NOTE — Transfer of Care (Signed)
Immediate Anesthesia Transfer of Care Note  Patient: Heather Burnett  Procedure(s) Performed: ANTERIOR CERVICAL DECOMPRESSION/DISCECTOMY FUSION 1 LEVEL C6-7 (N/A )  Patient Location: PACU  Anesthesia Type:General  Level of Consciousness: sedated  Airway & Oxygen Therapy: Patient Spontanous Breathing and Patient connected to face mask oxygen  Post-op Assessment: Report given to RN and Post -op Vital signs reviewed and stable  Post vital signs: Reviewed and stable  Last Vitals:  Vitals Value Taken Time  BP    Temp    Pulse 80 10/02/20 1037  Resp 18 10/02/20 1037  SpO2 95 % 10/02/20 1037  Vitals shown include unvalidated device data.  Last Pain:  Vitals:   10/02/20 0711  TempSrc: Tympanic  PainSc: 3          Complications: No complications documented.

## 2020-10-02 NOTE — Op Note (Signed)
Indications: Heather Burnett is suffering from cervical radiculopathy. she failed conservative management, and elected to proceed with surgery.  Findings: large disc herniation left C6-7 foramen  Preoperative Diagnosis: Cervical radiculopathy Postoperative Diagnosis: same   EBL: 25 ml IVF: 1000 ml Drains: none Disposition: Extubated and Stable to PACU Complications: none  No foley catheter was placed.   Preoperative Note:   Risks of surgery discussed include: infection, bleeding, stroke, coma, death, paralysis, CSF leak, nerve/spinal cord injury, numbness, tingling, weakness, complex regional pain syndrome, recurrent stenosis and/or disc herniation, vascular injury, development of instability, neck/back pain, need for further surgery, persistent symptoms, development of deformity, and the risks of anesthesia. The patient understood these risks and agreed to proceed.  Operative Note:   Procedure:  1) Anterior cervical diskectomy and fusion at C6-7 2) Anterior cervical instrumentation at C6-7 using Globus Xtend 3) Insertion of biomechanical device at C6-7 (Hedron)   Procedure: After obtaining informed consent, the patient taken to the operating room, placed in supine position, general anesthesia induced.  The patient had a small shoulder roll placed behind their shoulders.  The patient received preop antibiotics and IV Decadron.  The patient had a neck incision outlined, was prepped and draped in usual sterile fashion. The incision was injected with local anesthetic.   An incision was opened, dissection taken down medial to the carotid artery and jugular vein, lateral to the trachea and esophagus.  The prevertebral fascia was identified, and a localizing x-ray demonstrated the correct level.  The longus colli were dissected laterally, and self-retaining retractors placed to open the operative field. The microscope was then brought into the field.  With this complete, distractor pins were  placed in the vertebral bodies of C6 and C7. The distractor was placed, and the anulus at C6/7 was opened using a bovie.  Curettes and pituitary rongeurs used to remove the majority of disk, then the drill was used to remove the posterior osteophyte and begin the foraminotomies. The nerve hook was used to elevate the posterior longitudinal ligament, which was then removed with Kerrison rongeurs. Bilateral foraminotomies were performed. A large free fragment was seen in the left foramen.  This was dissected free and removed. The microblunt nerve hook could be passed out the foramen bilaterally.   Meticulous hemostasis was obtained.  A biomechanical device (Globus Hedron 8 mm height x 14 mm width by 12 mm depth) was placed at C6/7. The device had been filled with allograft for aid in arthrodesis.  The caspar distractor was removed, and bone wax used for hemostasis. A 12 mm Globus Xtend plate was chosen.  Two screws placed in each vertebral body, respectively making sure the screws were behind the locking mechanism.  Final AP and lateral radiographs were taken.   With everything in good position, the wound was irrigated copiously with bacitracin-containing solution and meticulous hemostasis obtained.  Wound was closed in 2 layers using interrupted inverted 3-0 Vicryl sutures.  The wound was dressed with dermabond, the head of bed at 30 degrees, taken to recovery room in stable condition.  No new postop neurological deficits were identified.  Sponge and pattie counts were correct at the end of the procedure.   I performed the entire procedure with the assistance of Lonell Face NP as an Pensions consultant.  Meade Maw MD

## 2020-10-02 NOTE — Progress Notes (Signed)
Patient voided 792ml, ambulated 100 ft, tolerated food and drink.  Patient taken down for x-rays via wheelchair.  Patient dressed. IVs removed.

## 2020-10-02 NOTE — Anesthesia Procedure Notes (Signed)
Procedure Name: Intubation Date/Time: 10/02/2020 8:50 AM Performed by: Nelda Marseille, CRNA Pre-anesthesia Checklist: Patient identified, Patient being monitored, Timeout performed, Emergency Drugs available and Suction available Patient Re-evaluated:Patient Re-evaluated prior to induction Oxygen Delivery Method: Circle system utilized Preoxygenation: Pre-oxygenation with 100% oxygen Induction Type: IV induction Ventilation: Mask ventilation without difficulty Laryngoscope Size: Mac, 3 and McGraph Grade View: Grade I Tube type: Oral Tube size: 7.0 mm Number of attempts: 1 Airway Equipment and Method: Stylet Placement Confirmation: ETT inserted through vocal cords under direct vision,  positive ETCO2 and breath sounds checked- equal and bilateral Secured at: 21 cm Tube secured with: Tape Dental Injury: Teeth and Oropharynx as per pre-operative assessment

## 2020-10-02 NOTE — Anesthesia Preprocedure Evaluation (Addendum)
Anesthesia Evaluation  Patient identified by MRN, date of birth, ID band Patient awake    Reviewed: Allergy & Precautions, H&P , NPO status , Patient's Chart, lab work & pertinent test results, reviewed documented beta blocker date and time   Airway Mallampati: III  TM Distance: >3 FB Neck ROM: full    Dental  (+) Teeth Intact   Pulmonary neg pulmonary ROS, asthma , sleep apnea and Continuous Positive Airway Pressure Ventilation , pneumonia, COPD, former smoker,    Pulmonary exam normal        Cardiovascular Exercise Tolerance: Good hypertension, On Medications negative cardio ROS Normal cardiovascular exam+ dysrhythmias + Valvular Problems/Murmurs  Rhythm:regular Rate:Normal     Neuro/Psych  Headaches, PSYCHIATRIC DISORDERS Anxiety Depression negative neurological ROS  negative psych ROS   GI/Hepatic negative GI ROS, Neg liver ROS, GERD  ,  Endo/Other  negative endocrine ROSHypothyroidism   Renal/GU negative Renal ROS  negative genitourinary   Musculoskeletal  (+) Arthritis , Osteoarthritis,    Abdominal   Peds  Hematology negative hematology ROS (+)   Anesthesia Other Findings Past Medical History: No date: Allergy No date: Anxiety No date: Asthma     Comment:  WELL CONTROLLED No date: Cancer ([place])     Comment:  melanoma on my chest No date: COPD (chronic obstructive pulmonary disease) (HCC) No date: DDD (degenerative disc disease), cervical     Comment:  and lower back No date: Depression No date: DJD (degenerative joint disease) No date: Dysrhythmia No date: Empty sella syndrome (HCC)     Comment:  followed at [facility] No date: Family history of adverse reaction to anesthesia     Comment:  mother almost died with anesthesia in the past, hard to               wake up and got real sick on my stomach vomiting, now she              receives local anethesia. No date: GERD (gastroesophageal reflux disease) No  date: Headache     Comment:  history of migraine No date: Heart murmur No date: History of methicillin resistant staphylococcus aureus (MRSA) 07/2004: History of palpitations No date: Hyperlipidemia No date: Hypertension No date: Hypothyroidism No date: Lymphocytosis 2016: Neuropathy No date: PCOS (polycystic ovarian syndrome) No date: Pituitary microadenoma [facility])     Comment:  followed at [facility] 2008: Pneumonia No date: Pre-diabetes No date: Sleep apnea     Comment:  USES CPAP No date: Thyroid disease 2012: Von Willebrand disease ([place])     Comment:  PT STATES SHE HAS NEVER HAD ANY ISSUES WITH BLEEDING Past Surgical History: No date: BACK SURGERY     Comment:  removal of 2 lumbar vertebrae 2010: BREAST BIOPSY; Right     Comment:  NEG No date: CHOLECYSTECTOMY 03/03/2013, 02/06/2008: COLONOSCOPY 08/05/2018: COLONOSCOPY WITH PROPOFOL; N/A     Comment:  Procedure: COLONOSCOPY WITH PROPOFOL;  Surgeon: Manya Silvas, MD;  Location[facility] ENDOSCOPY;  Service:               Endoscopy;  Laterality: N/A; 02/06/2008: EGD 08/21/2016: HALLUX VALGUS CHEILECTOMY; Left     Comment:  Procedure: HALLUX VALGUS CHEILECTOMY;  Surgeon: Sharlotte Alamo, DPM;  Location: [facility] ORS;  Service: Podiatry;  Laterality: Left; 10/2014: HEMORRHOID SURGERY No date: SPINE SURGERY No date: TONSILLECTOMY AND ADENOIDECTOMY   Reproductive/Obstetrics negative OB ROS                            Anesthesia Physical  Anesthesia Plan  ASA: III  Anesthesia Plan: General   Post-op Pain Management:    Induction: Intravenous  PONV Risk Score and Plan: 3  Airway Management Planned: Oral ETT  Additional Equipment:   Intra-op Plan:   Post-operative Plan: Extubation in OR  Informed Consent: I have reviewed the patients History and Physical, chart, labs and discussed the procedure including the risks, benefits and alternatives for the proposed  anesthesia with the patient or authorized representative who has indicated his/her understanding and acceptance.     Dental advisory given  Plan Discussed with: CRNA  Anesthesia Plan Comments:         Anesthesia Quick Evaluation

## 2020-10-02 NOTE — Discharge Instructions (Signed)
Your surgeon has performed an operation on your cervical spine (neck) to relieve pressure on the spinal cord and/or nerves. This involved making an incision in the front of your neck and removing one or more of the discs that support your spine. Next, a small piece of bone, a titanium plate, and screws were used to fuse two or more of the vertebrae (bones) together.  The following are instructions to help in your recovery once you have been discharged from the hospital. Even if you feel well, it is important that you follow these activity guidelines. If you do not let your neck heal properly from the surgery, you can increase the chance of return of your symptoms and other complications.  * Do not take anti-inflammatory medications for 3 months after surgery (naproxen [Aleve], ibuprofen [Advil, Motrin], etc.). These medications can prevent your bones from healing properly.  Activity    No bending, lifting, or twisting ("BLT"). Avoid lifting objects heavier than 10 pounds (gallon milk jug).  Where possible, avoid household activities that involve lifting, bending, reaching, pushing, or pulling such as laundry, vacuuming, grocery shopping, and childcare. Try to arrange for help from friends and family for these activities while your back heals.  Increase physical activity slowly as tolerated.  Taking short walks is encouraged, but avoid strenuous exercise. Do not jog, run, bicycle, lift weights, or participate in any other exercises unless specifically allowed by your doctor.  Talk to your doctor before resuming sexual activity.  You should not drive until cleared by your doctor.  Until released by your doctor, you should not return to work or school.  You should rest at home and let your body heal.   You may shower three days after your surgery.  After showering, lightly dab your incision dry. Do not take a tub bath or go swimming until approved by your doctor at your follow-up appointment.  If  your doctor ordered a cervical collar (neck brace) for you, you should wear it whenever you are out of bed. You may remove it when lying down or sleeping, but you should wear it at all other times. Not all neck surgeries require a cervical collar.  If you smoke, we strongly recommend that you quit.  Smoking has been proven to interfere with normal bone healing and will dramatically reduce the success rate of your surgery. Please contact [facility] (800-QUIT-NOW) and use the resources at www.[facility].com for assistance in stopping smoking.  Surgical Incision   If you have a dressing on your incision, you may remove it two days after your surgery. Keep your incision area clean and dry.  If you have staples or stitches on your incision, you should have a follow up scheduled for removal. If you do not have staples or stitches, you will have steri-strips (small pieces of surgical tape) or Dermabond glue. The steri-strips/glue should begin to peel away within about a week (it is fine if the steri-strips fall off before then). If the strips are still in place one week after your surgery, you may gently remove them.  Diet           You may return to your usual diet. However, you may experience discomfort when swallowing in the first month after your surgery. This is normal. You may find that softer foods are more comfortable for you to swallow. Be sure to stay hydrated.  When to Contact us  You may experience pain in your neck and/or pain between your shoulder blades. This  is normal and should improve in the next few weeks with the help of pain medication, muscle relaxers, and rest. Some patients report that a warm compress on the back of the neck or between the shoulder blades helps.  However, should you experience any of the following, contact us immediately: . New numbness or weakness . Pain that is progressively getting worse, and is not relieved by your pain medication, muscle relaxers, rest, and  warm compresses . Bleeding, redness, swelling, pain, or drainage from surgical incision . Chills or flu-like symptoms . Fever greater than 101.0 F (38.3 C) . Inability to eat, drink fluids, or take medications . Problems with bowel or bladder functions . Difficulty breathing or shortness of breath . Warmth, tenderness, or swelling in your calf Contact Information . During office hours (Monday-Friday 9 am to 5 pm), please call your physician at [phone] and ask for Berdine Addison . After hours and weekends, please call [phone] and an answering service will put you in touch with either Dr. Lacinda Axon or Dr. Izora Ribas.  . For a life-threatening emergency, call [place]  1) The drugs that you were given will stay in your system until tomorrow so for the next 24 hours you should not: A) Drive an automobile B) Make any legal decisions C) Drink any alcoholic beverage  2) You may resume regular meals tomorrow.  Today it is better to start with liquids and gradually work up to solid foods. You may eat anything you prefer, but it is better to start with liquids, then soup and crackers, and gradually work up to solid foods.  3) Please notify your doctor immediately if you have any unusual bleeding, trouble breathing, redness and pain at the surgery site, drainage, fever, or pain not relieved by medication.  4) Additional Instructions:   Please contact your physician with any problems or Same Day Surgery at [phone], Monday through Friday 6 am to 4 pm, or [place] at [facility] number at [phone].

## 2020-10-02 NOTE — Progress Notes (Signed)
   10/02/20 0805  Clinical Encounter Type  Visited With Family  Visit Type Initial  Referral From Chaplain  Consult/Referral To Chaplain  While rounding SDS waiting area, chaplain visited with Pt's friend, Malachy Mood and she wanted to know if Pt was in surgery yet because she had not received a text.  Chaplain went onto the unit to find out where Pt was in the procedure. When chaplain arrived at the Pt's room, she told chaplain, staff told her she would be going to procedure shortly. Chaplain went back to the waiting area and gave Pt's friend this information.Malachy Mood was glad to get this information.

## 2020-10-02 NOTE — H&P (Signed)
  I have reviewed and confirmed my history and physical from 09/05/2020 with no additions or changes. Plan for C6-7 ACDF.  Risks and benefits reviewed.  Heart sounds normal no MRG. Chest Clear to Auscultation Bilaterally.

## 2020-10-02 NOTE — Discharge Summary (Signed)
Procedure: ACDF C6-7 Procedure date: 10/02/2020 Diagnosis: cervical radiculopathy    History: Heather Burnett is s/p ACDF C6-7 POD0: Tolerated procedure well. Evaluated in post op recovery still disoriented from anesthesia but able to answer questions and obey commands.   Physical Exam: Vitals:   10/02/20 0711  BP: (!) 146/77  Pulse: 86  Resp: 18  Temp: (!) 96.9 F (36.1 C)  SpO2: 96%    General: Alert and oriented, lying in bed Strength:5/5 throughout  Sensation: intact and symmetric throughout  Skin: incision clean, dry, intact  Data:  No results for input(s): NA, K, CL, CO2, BUN, CREATININE, LABGLOM, GLUCOSE, CALCIUM in the last 168 hours. No results for input(s): AST, ALT, ALKPHOS in the last 168 hours.  Invalid input(s): TBILI   No results for input(s): WBC, HGB, HCT, PLT in the last 168 hours. No results for input(s): APTT, INR in the last 168 hours.       Assessment/Plan:  Heather Burnett is POD0 s/p ACDF C6-7. She must stay for 4-6 hours postoperatively and then can be discharged home.    She will follow up in two weeks in clinic with me.   Lonell Face, NP Department of Neurosurgery

## 2020-10-03 ENCOUNTER — Encounter: Payer: Self-pay | Admitting: Neurosurgery

## 2020-11-01 MED FILL — RYBELSUS 3 MG TABS: 3 | 30 days supply | Qty: 30 | Fill #1

## 2020-11-01 MED FILL — CYCLOBENZAPRINE HCL 5 MG TA: 5 | 20 days supply | Qty: 60 | Fill #0

## 2020-11-06 ENCOUNTER — Other Ambulatory Visit (HOSPITAL_COMMUNITY): Payer: Self-pay | Admitting: Psychiatry

## 2020-11-06 MED FILL — NP THYROID 30 MG TABLET: 30 | 30 days supply | Qty: 30 | Fill #1

## 2020-11-06 MED FILL — SUBVENITE 200 MG TABS: 200 | 90 days supply | Qty: 90 | Fill #1

## 2020-11-06 MED FILL — CITALOPRAM HBR 40 MG TABLET: 40 | 90 days supply | Qty: 90 | Fill #0

## 2020-11-06 MED FILL — NP THYROID 120 MG TABLET: 120 | 30 days supply | Qty: 30 | Fill #1

## 2020-11-06 MED FILL — ATORVASTATIN CALCIUM 10 MG: 10 | 90 days supply | Qty: 90 | Fill #1

## 2020-11-06 MED FILL — PROGESTERONE 200 MG CAPS: 200 | 30 days supply | Qty: 60 | Fill #1

## 2020-11-14 ENCOUNTER — Other Ambulatory Visit (HOSPITAL_COMMUNITY): Payer: Self-pay | Admitting: Internal Medicine

## 2020-11-14 ENCOUNTER — Encounter (INDEPENDENT_AMBULATORY_CARE_PROVIDER_SITE_OTHER): Payer: Self-pay | Admitting: Internal Medicine

## 2020-11-22 MED FILL — SPIRONOLACTONE 50 MG TABLET: 50 | 90 days supply | Qty: 90 | Fill #1

## 2020-11-26 ENCOUNTER — Other Ambulatory Visit (HOSPITAL_COMMUNITY): Payer: Self-pay | Admitting: Psychiatry

## 2020-11-26 MED FILL — DULOXETINE HCL 60 MG CPEP: 60 | 90 days supply | Qty: 90 | Fill #0

## 2020-11-29 ENCOUNTER — Other Ambulatory Visit: Payer: Self-pay | Admitting: Internal Medicine

## 2020-11-29 DIAGNOSIS — Z1231 Encounter for screening mammogram for malignant neoplasm of breast: Secondary | ICD-10-CM

## 2020-12-11 MED FILL — RYBELSUS 3 MG TABS: 3 | 30 days supply | Qty: 30 | Fill #2

## 2020-12-19 ENCOUNTER — Ambulatory Visit (INDEPENDENT_AMBULATORY_CARE_PROVIDER_SITE_OTHER): Payer: No Typology Code available for payment source | Admitting: Internal Medicine

## 2021-01-03 ENCOUNTER — Other Ambulatory Visit: Payer: Self-pay

## 2021-01-03 ENCOUNTER — Ambulatory Visit
Admission: RE | Admit: 2021-01-03 | Discharge: 2021-01-03 | Disposition: A | Discharge status: Home / Self Care | Payer: No Typology Code available for payment source | Source: Ambulatory Visit | Attending: Internal Medicine | Admitting: Internal Medicine

## 2021-01-03 DIAGNOSIS — Z1231 Encounter for screening mammogram for malignant neoplasm of breast: Secondary | ICD-10-CM | POA: Insufficient documentation

## 2021-01-06 ENCOUNTER — Encounter (INDEPENDENT_AMBULATORY_CARE_PROVIDER_SITE_OTHER): Payer: Self-pay | Admitting: Internal Medicine

## 2021-01-06 ENCOUNTER — Other Ambulatory Visit: Payer: Self-pay

## 2021-01-06 ENCOUNTER — Other Ambulatory Visit (INDEPENDENT_AMBULATORY_CARE_PROVIDER_SITE_OTHER): Payer: Self-pay | Admitting: Internal Medicine

## 2021-01-06 ENCOUNTER — Ambulatory Visit (INDEPENDENT_AMBULATORY_CARE_PROVIDER_SITE_OTHER): Payer: No Typology Code available for payment source | Admitting: Internal Medicine

## 2021-01-06 VITALS — BP 130/86 | HR 86 | Temp 97.3°F | Resp 18 | Ht 62.0 in | Wt 219.0 lb

## 2021-01-06 DIAGNOSIS — E039 Hypothyroidism, unspecified: Secondary | ICD-10-CM

## 2021-01-06 DIAGNOSIS — I10 Essential (primary) hypertension: Secondary | ICD-10-CM | POA: Diagnosis not present

## 2021-01-06 DIAGNOSIS — E119 Type 2 diabetes mellitus without complications: Secondary | ICD-10-CM | POA: Diagnosis not present

## 2021-01-06 DIAGNOSIS — E782 Mixed hyperlipidemia: Secondary | ICD-10-CM | POA: Diagnosis not present

## 2021-01-06 MED ORDER — RYBELSUS 7 MG PO TABS: 7.0000 mg | ORAL_TABLET | Freq: Every day | ORAL | 3 refills | Status: DC

## 2021-01-06 MED ORDER — NP THYROID 30 MG PO TABS: 30.0000 mg | ORAL_TABLET | Freq: Every day | ORAL | 1 refills | Status: DC

## 2021-01-06 MED ORDER — NP THYROID 120 MG PO TABS: 120.0000 mg | ORAL_TABLET | Freq: Every day | ORAL | 1 refills | Status: DC

## 2021-01-06 MED FILL — RYBELSUS 7 MG TABS: 7 | 30 days supply | Qty: 30 | Fill #0

## 2021-01-06 NOTE — Progress Notes (Signed)
Metrics: Intervention Frequency ACO  Documented Smoking Status Yearly  Screened one or more times in 24 months  Cessation Counseling or  Active cessation medication Past 24 months  Past 24 months   Guideline developer: UpToDate (See UpToDate for funding source) Date Released: 2014       Wellness Office Visit  Subjective:  Patient ID: Heather Burnett, female    DOB: Jun 18, 1971  Age: 50 y.o. MRN: [id number]  CC: This lady comes in for follow-up of diabetes, hypothyroidism, hypertension, dyslipidemia, obesity. HPI  She recently had neck surgery and she feels so much better with relief of pain.  Prior to this, she had difficulty with nutrition and exercise and she feels more optimistic that she will get back on track. She continues on Rybelsus for diabetes. She continues on desiccated NP thyroid for her hypothyroidism. She continues on metoprolol for hypertension. She denies any chest pain, dyspnea, palpitations or limb weakness. Past Medical History:  Diagnosis Date  . Allergy   . Anxiety   . Asthma    WELL CONTROLLED  . Cancer ([place])    melanoma on my chest  . COPD (chronic obstructive pulmonary disease) ([place])    not sure about COPD   . DDD (degenerative disc disease), cervical    and lower back  . Depression   . DJD (degenerative joint disease)   . Dysrhythmia   . Empty sella syndrome ([place])    followed at [facility]  . Family history of adverse reaction to anesthesia    mother almost died with anesthesia in the past, hard to wake up and got real sick on my stomach vomiting, now she receives local anethesia.  Marland Kitchen GERD (gastroesophageal reflux disease)   . Headache    history of migraine  . Heart murmur   . History of methicillin resistant staphylococcus aureus (MRSA)   . History of palpitations 07/2004  . Hyperlipidemia   . Hypertension   . Hypothyroidism   . Hypothyroidism, adult 02/08/2020  . Lymphocytosis   . Neuropathy 2016  . PCOS (polycystic ovarian syndrome)   .  Pituitary microadenoma ([place])    followed at [facility]  . Pneumonia 2008  . Pre-diabetes   . Sleep apnea    USES CPAP  . Thyroid disease   . Von Willebrand disease ([place]) 2012   PT STATES SHE HAS NEVER HAD ANY ISSUES WITH BLEEDING   Past Surgical History:  Procedure Laterality Date  . ACHILLES TENDON SURGERY Left 12/15/2019   Procedure: ACHILLES TENDON REPAIR PRIMARY;  Surgeon: Samara Deist, DPM;  Location: [facility] ORS;  Service: Podiatry;  Laterality: Left;  . ANTERIOR CERVICAL DECOMP/DISCECTOMY FUSION N/A 10/02/2020   Procedure: ANTERIOR CERVICAL DECOMPRESSION/DISCECTOMY FUSION 1 LEVEL C6-7;  Surgeon: Meade Maw, MD;  Location: [facility] ORS;  Service: Neurosurgery;  Laterality: N/A;  . BACK SURGERY     removal of 2 lumbar vertebrae  . BREAST BIOPSY Right 2010   NEG  . CHOLECYSTECTOMY    . COLONOSCOPY  03/03/2013, 02/06/2008  . COLONOSCOPY WITH PROPOFOL N/A 08/05/2018   Procedure: COLONOSCOPY WITH PROPOFOL;  Surgeon: Manya Silvas, MD;  Location: [facility] ENDOSCOPY;  Service: Endoscopy;  Laterality: N/A;  . EGD  02/06/2008  . GASTROC RECESSION EXTREMITY Left 12/15/2019   Procedure: GASTROC RECESSION EXTREMITY;  Surgeon: Samara Deist, DPM;  Location: [facility] ORS;  Service: Podiatry;  Laterality: Left;  . HALLUX VALGUS CHEILECTOMY Left 08/21/2016   Procedure: HALLUX VALGUS CHEILECTOMY;  Surgeon: Sharlotte Alamo, DPM;  Location: [facility] ORS;  Service: Podiatry;  Laterality: Left;  . HEMORRHOID SURGERY  10/2014  . SPINE SURGERY    . TONSILLECTOMY AND ADENOIDECTOMY       Family History  Problem Relation Age of Onset  . Diabetes Father   . Hyperlipidemia Father   . Hypertension Father   . Cancer Father   . Heart attack Father   . Breast cancer Sister 33  . Breast cancer Maternal Aunt 31  . 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   Social History   Tobacco Use  . Smoking  status: Former Smoker    Years: 8.00    Types: Cigarettes    Quit date: 04/09/1994    Years since quitting: 26.7  . Smokeless tobacco: Never Used  Substance Use Topics  . Alcohol use: No    Alcohol/week: 0.0 standard drinks    Current Meds  Medication Sig  . acetaminophen (TYLENOL) 500 MG tablet Take 500 mg by mouth every 6 (six) hours as needed for mild pain or headache.   . albuterol (VENTOLIN HFA) 108 (90 Base) MCG/ACT inhaler Inhale 2 puffs into the lungs every 6 (six) hours as needed for wheezing or shortness of breath.  . celecoxib (CELEBREX) 100 MG capsule Take 1 capsule (100 mg total) by mouth 2 (two) times daily.  . cetirizine (ZYRTEC) 10 MG tablet Take 10 mg by mouth daily as needed (seasonal allergies.).   Marland Kitchen Cholecalciferol (VITAMIN D-3) 125 MCG (5000 UT) TABS Take 5,000 Units by mouth daily.  . citalopram (CELEXA) 40 MG tablet Take 40 mg by mouth daily after lunch.   . clonazePAM (KLONOPIN) 0.5 MG tablet Take 0.5 mg by mouth daily as needed for anxiety.  . cyclobenzaprine (FLEXERIL) 5 MG tablet Take 1 tablet (5 mg total) by mouth 3 (three) times daily as needed.  . doxycycline (VIBRAMYCIN) 100 MG capsule Take 100 mg by mouth 2 (two) times daily as needed (acne flare ups).   . DULoxetine (CYMBALTA) 60 MG capsule Take 60 mg by mouth daily.  . Fluticasone-Salmeterol (ADVAIR) 250-50 MCG/DOSE AEPB Inhale 1 puff into the lungs every 12 (twelve) hours as needed (seasonal allergies).   . furosemide (LASIX) 40 MG tablet Take 40 mg by mouth daily.   Marland Kitchen gabapentin (NEURONTIN) 300 MG capsule Take 300 mg by mouth daily.  Marland Kitchen lamoTRIgine (LAMICTAL) 200 MG tablet Take 200 mg by mouth every morning.   . metoprolol succinate (TOPROL XL) 50 MG 24 hr tablet Take 1 tablet (50 mg total) by mouth 2 (two) times daily. Take with or immediately following a meal. (Patient taking differently: Take 25-50 mg by mouth See admin instructions. Take 50 mg in morning and 25 mg in the afternoon)  . progesterone  (PROMETRIUM) 200 MG capsule TAKE 2 CAPSULES BY MOUTH EVERY NIGHT (Patient taking differently: Take 200 mg by mouth at bedtime.)  . Semaglutide (RYBELSUS) 3 MG TABS Take 3 mg by mouth daily.  . Semaglutide (RYBELSUS) 7 MG TABS Take 7 mg by mouth daily.  Marland Kitchen spironolactone (ALDACTONE) 50 MG tablet Take 1 tablet (50 mg total) by mouth daily.  Marland Kitchen tiZANidine (ZANAFLEX) 4 MG tablet Take 4 mg by mouth daily as needed for pain.  . [DISCONTINUED] NP THYROID 120 MG tablet Take 1 tablet (120 mg total) by mouth daily before breakfast. (Patient taking differently: Take 120 mg by mouth at bedtime. Take with 30 mg for a total  of 150 mg at bedtime)  . [DISCONTINUED] NP THYROID 30 MG tablet Take 1 tablet (30 mg total) by mouth daily before breakfast. (Patient taking differently: Take 30 mg by mouth at bedtime.)       Objective:   Today's Vitals: BP 130/86 (BP Location: Left Arm, Patient Position: Sitting, Cuff Size: Large)   Pulse 86   Temp (!) 97.3 F (36.3 C) (Temporal)   Resp 18   Ht 5\' 2"  (1.575 m)   Wt 219 lb (99.3 kg)   LMP 12/16/2020   SpO2 96%   BMI 40.06 kg/m  Vitals with BMI 01/06/2021 10/02/2020 10/02/2020  Height 5\' 2"  - -  Weight 219 lbs - -  BMI 51.70 - -  Systolic [phone]  Diastolic 86 62 62  Pulse 86 76 78  Some encounter information is confidential and restricted. Go to Review Flowsheets activity to see all data.     Physical Exam  Although she remains morbidly obese, she has not gained further weight.  Blood pressure diastolic somewhat elevated today but before has been in a good range.     Assessment   1. Hypothyroidism, adult   2. Diabetes mellitus without complication ([place])   3. Benign essential HTN   4. Mixed hyperlipidemia       Tests ordered Orders Placed This Encounter  Procedures  . Microalbumin, urine     Plan: 1. She will continue with NP thyroid at the present dose and we will refill this today. 2. In terms of her diabetes and insulin  resistance, I think we can further increase the dose of Rybelsus to 7 mg daily. 3. Continue with antihypertensive medication as before. 4. I will see her in about 6 weeks time to see how she is doing and we will check all the blood work then.  In the meantime, she will focus on nutrition and exercise as we have discussed many times before.   Meds ordered this encounter  Medications  . Semaglutide (RYBELSUS) 7 MG TABS    Sig: Take 7 mg by mouth daily.    Dispense:  30 tablet    Refill:  3  . NP THYROID 120 MG tablet    Sig: Take 1 tablet (120 mg total) by mouth at bedtime. Take with 30 mg for a total of 150 mg at bedtime    Dispense:  90 tablet    Refill:  1  . NP THYROID 30 MG tablet    Sig: Take 1 tablet (30 mg total) by mouth daily before breakfast.    Dispense:  90 tablet    Refill:  1    Nimish Luther Parody, MD

## 2021-01-07 ENCOUNTER — Other Ambulatory Visit (HOSPITAL_BASED_OUTPATIENT_CLINIC_OR_DEPARTMENT_OTHER): Payer: Self-pay

## 2021-01-07 LAB — MICROALBUMIN, URINE: Microalb, Ur: <0.2 mg/dL

## 2021-01-11 ENCOUNTER — Encounter (INDEPENDENT_AMBULATORY_CARE_PROVIDER_SITE_OTHER): Payer: Self-pay | Admitting: Internal Medicine

## 2021-01-13 ENCOUNTER — Other Ambulatory Visit (INDEPENDENT_AMBULATORY_CARE_PROVIDER_SITE_OTHER): Payer: Self-pay | Admitting: Internal Medicine

## 2021-01-13 MED ORDER — RYBELSUS 7 MG PO TABS: 7.0000 mg | ORAL_TABLET | Freq: Every day | ORAL | 1 refills | Status: DC

## 2021-01-24 ENCOUNTER — Other Ambulatory Visit (HOSPITAL_COMMUNITY): Payer: Self-pay

## 2021-01-27 ENCOUNTER — Other Ambulatory Visit (HOSPITAL_COMMUNITY): Payer: Self-pay

## 2021-01-27 ENCOUNTER — Other Ambulatory Visit: Payer: Self-pay

## 2021-01-27 MED ORDER — GABAPENTIN 300 MG PO CAPS
1.0000 | ORAL_CAPSULE | Freq: Two times a day (BID) | ORAL | 5 refills | Status: DC
  Filled 2021-01-27: qty 60, 30d supply, fill #0

## 2021-01-27 MED ORDER — ETODOLAC 400 MG PO TABS
ORAL_TABLET | ORAL | 4 refills | Status: DC
  Filled 2021-01-27: qty 20, 20d supply, fill #0

## 2021-01-27 MED ORDER — ALBUTEROL SULFATE HFA 108 (90 BASE) MCG/ACT IN AERS
INHALATION_SPRAY | RESPIRATORY_TRACT | 11 refills | Status: AC
  Filled 2021-01-27: qty 18, 25d supply, fill #0

## 2021-01-27 MED ORDER — TIZANIDINE HCL 4 MG PO TABS
ORAL_TABLET | ORAL | 1 refills | Status: DC
  Filled 2021-01-27: qty 20, 6d supply, fill #0

## 2021-01-27 MED ORDER — ATORVASTATIN CALCIUM 40 MG PO TABS
ORAL_TABLET | ORAL | 11 refills | Status: DC
  Filled 2021-01-27: qty 90, 90d supply, fill #0
  Filled 2021-05-12: qty 90, 90d supply, fill #1
  Filled 2021-08-16: qty 90, 90d supply, fill #2
  Filled 2021-12-24: qty 90, 90d supply, fill #3

## 2021-01-27 MED ORDER — FLOVENT HFA 110 MCG/ACT IN AERO
INHALATION_SPRAY | RESPIRATORY_TRACT | 11 refills | Status: DC
  Filled 2021-01-27: qty 12, 60d supply, fill #0

## 2021-01-30 ENCOUNTER — Other Ambulatory Visit: Payer: Self-pay

## 2021-01-30 ENCOUNTER — Other Ambulatory Visit (HOSPITAL_COMMUNITY): Payer: Self-pay

## 2021-01-30 ENCOUNTER — Other Ambulatory Visit (INDEPENDENT_AMBULATORY_CARE_PROVIDER_SITE_OTHER): Payer: Self-pay | Admitting: Internal Medicine

## 2021-01-30 MED ORDER — SPIRONOLACTONE 50 MG PO TABS
ORAL_TABLET | Freq: Every day | ORAL | 2 refills | Status: DC
  Filled 2021-01-30: qty 30, 30d supply, fill #0

## 2021-01-30 MED FILL — Semaglutide Tab 7 MG: ORAL | 90 days supply | Qty: 90 | Fill #0 | Status: AC

## 2021-01-31 ENCOUNTER — Other Ambulatory Visit (HOSPITAL_COMMUNITY): Payer: Self-pay

## 2021-02-03 ENCOUNTER — Other Ambulatory Visit (HOSPITAL_COMMUNITY): Payer: Self-pay

## 2021-02-04 ENCOUNTER — Other Ambulatory Visit (HOSPITAL_COMMUNITY): Payer: Self-pay

## 2021-02-07 ENCOUNTER — Other Ambulatory Visit (HOSPITAL_COMMUNITY): Payer: Self-pay

## 2021-02-07 MED FILL — Progesterone Cap 200 MG: ORAL | 30 days supply | Qty: 60 | Fill #0 | Status: AC

## 2021-02-10 ENCOUNTER — Other Ambulatory Visit (HOSPITAL_COMMUNITY): Payer: Self-pay

## 2021-02-12 ENCOUNTER — Other Ambulatory Visit (HOSPITAL_COMMUNITY): Payer: Self-pay

## 2021-02-12 MED FILL — Thyroid Tab 30 MG (1/2 Grain): ORAL | 90 days supply | Qty: 90 | Fill #0 | Status: AC

## 2021-02-12 MED FILL — Lamotrigine Tab 200 MG: ORAL | 90 days supply | Qty: 90 | Fill #0 | Status: AC

## 2021-02-12 MED FILL — Thyroid Tab 120 MG (2 Grain): ORAL | 90 days supply | Qty: 90 | Fill #0 | Status: AC

## 2021-02-20 ENCOUNTER — Ambulatory Visit (INDEPENDENT_AMBULATORY_CARE_PROVIDER_SITE_OTHER): Payer: No Typology Code available for payment source | Admitting: Internal Medicine

## 2021-02-21 ENCOUNTER — Other Ambulatory Visit (HOSPITAL_COMMUNITY): Payer: Self-pay

## 2021-02-21 MED ORDER — CITALOPRAM HYDROBROMIDE 40 MG PO TABS
40.0000 mg | ORAL_TABLET | Freq: Every day | ORAL | 0 refills | Status: DC
  Filled 2021-02-21: qty 90, 90d supply, fill #0

## 2021-02-21 MED ORDER — DULOXETINE HCL 60 MG PO CPEP
ORAL_CAPSULE | ORAL | 1 refills | Status: DC
  Filled 2021-02-21: qty 90, 90d supply, fill #0
  Filled 2021-07-15: qty 90, 90d supply, fill #1

## 2021-02-21 MED ORDER — LAMOTRIGINE 200 MG PO TABS
ORAL_TABLET | Freq: Every day | ORAL | 1 refills | Status: DC
  Filled 2021-02-21 – 2021-05-13 (×2): qty 90, 90d supply, fill #0
  Filled 2021-08-16: qty 90, 90d supply, fill #1

## 2021-02-26 ENCOUNTER — Other Ambulatory Visit (HOSPITAL_COMMUNITY): Payer: Self-pay

## 2021-02-27 ENCOUNTER — Other Ambulatory Visit: Payer: Self-pay

## 2021-02-27 MED ORDER — HYDROCODONE-ACETAMINOPHEN 7.5-325 MG PO TABS
ORAL_TABLET | ORAL | 0 refills | Status: DC
  Filled 2021-02-27: qty 3, 1d supply, fill #0

## 2021-02-27 MED ORDER — AZITHROMYCIN 250 MG PO TABS
ORAL_TABLET | ORAL | 0 refills | Status: DC
  Filled 2021-02-27: qty 6, 5d supply, fill #0

## 2021-03-13 ENCOUNTER — Ambulatory Visit (INDEPENDENT_AMBULATORY_CARE_PROVIDER_SITE_OTHER): Payer: No Typology Code available for payment source | Admitting: Internal Medicine

## 2021-03-19 ENCOUNTER — Other Ambulatory Visit (HOSPITAL_COMMUNITY): Payer: Self-pay

## 2021-03-24 ENCOUNTER — Other Ambulatory Visit: Payer: Self-pay

## 2021-03-24 MED ORDER — FREESTYLE LITE TEST VI STRP
ORAL_STRIP | 0 refills | Status: DC
  Filled 2021-03-24: qty 100, 90d supply, fill #0

## 2021-03-31 ENCOUNTER — Other Ambulatory Visit (HOSPITAL_COMMUNITY): Payer: Self-pay

## 2021-04-08 ENCOUNTER — Encounter (INDEPENDENT_AMBULATORY_CARE_PROVIDER_SITE_OTHER): Payer: Self-pay | Admitting: Internal Medicine

## 2021-04-08 ENCOUNTER — Other Ambulatory Visit: Payer: Self-pay

## 2021-04-08 ENCOUNTER — Ambulatory Visit (INDEPENDENT_AMBULATORY_CARE_PROVIDER_SITE_OTHER): Payer: No Typology Code available for payment source | Admitting: Internal Medicine

## 2021-04-08 VITALS — BP 114/74 | HR 82 | Temp 97.3°F | Ht 62.0 in | Wt 221.8 lb

## 2021-04-08 DIAGNOSIS — E039 Hypothyroidism, unspecified: Secondary | ICD-10-CM

## 2021-04-08 DIAGNOSIS — E119 Type 2 diabetes mellitus without complications: Secondary | ICD-10-CM | POA: Diagnosis not present

## 2021-04-08 DIAGNOSIS — E782 Mixed hyperlipidemia: Secondary | ICD-10-CM | POA: Diagnosis not present

## 2021-04-08 DIAGNOSIS — I10 Essential (primary) hypertension: Secondary | ICD-10-CM

## 2021-04-08 DIAGNOSIS — Z23 Encounter for immunization: Secondary | ICD-10-CM | POA: Diagnosis not present

## 2021-04-08 NOTE — Progress Notes (Signed)
Metrics: Intervention Frequency ACO  Documented Smoking Status Yearly  Screened one or more times in 24 months  Cessation Counseling or  Active cessation medication Past 24 months  Past 24 months   Guideline developer: UpToDate (See UpToDate for funding source) Date Released: 2014       Wellness Office Visit  Subjective:  Patient ID: Heather Burnett, female    DOB: 1971-09-29  Age: 50 y.o. MRN: [id number]  CC: This lady comes in for follow-up of hypothyroidism, diabetes, hypertension and hyperlipidemia. HPI  She has tolerated the higher dose of Rybelsus 7 mg daily.  Unfortunately, she has been under a lot of stress and I think her nutrition has not been consistent. She continues to take NP thyroid 150 mg daily for her hypothyroidism. She continues on metoprolol for hypertension. She continues on statin therapy for hyperlipidemia in the face of diabetes Past Medical History:  Diagnosis Date   Allergy    Anxiety    Asthma    WELL CONTROLLED   Cancer ([place])    melanoma on my chest   COPD (chronic obstructive pulmonary disease) ([place])    not sure about COPD    DDD (degenerative disc disease), cervical    and lower back   Depression    DJD (degenerative joint disease)    Dysrhythmia    Empty sella syndrome (HCC)    followed at [facility] history of adverse reaction to anesthesia    mother almost died with anesthesia in the past, hard to wake up and got real sick on my stomach vomiting, now she receives local anethesia.   GERD (gastroesophageal reflux disease)    Headache    history of migraine   Heart murmur    History of methicillin resistant staphylococcus aureus (MRSA)    History of palpitations 07/2004   Hyperlipidemia    Hypertension    Hypothyroidism    Hypothyroidism, adult 02/08/2020   Lymphocytosis    Neuropathy 2016   PCOS (polycystic ovarian syndrome)    Pituitary microadenoma ([place])    followed at [facility]   Pneumonia 2008   Pre-diabetes    Sleep apnea     USES CPAP   Thyroid disease    Von Willebrand disease ([place]) 2012   PT STATES SHE HAS NEVER HAD ANY ISSUES WITH BLEEDING   Past Surgical History:  Procedure Laterality Date   ACHILLES TENDON SURGERY Left 12/15/2019   Procedure: ACHILLES TENDON REPAIR PRIMARY;  Surgeon: Samara Deist, DPM;  Location: [facility] ORS;  Service: Podiatry;  Laterality: Left;   ANTERIOR CERVICAL DECOMP/DISCECTOMY FUSION N/A 10/02/2020   Procedure: ANTERIOR CERVICAL DECOMPRESSION/DISCECTOMY FUSION 1 LEVEL C6-7;  Surgeon: Meade Maw, MD;  Location: [facility] ORS;  Service: Neurosurgery;  Laterality: N/A;   BACK SURGERY     removal of 2 lumbar vertebrae   BREAST BIOPSY Right 2010   NEG   CHOLECYSTECTOMY     COLONOSCOPY  03/03/2013, 02/06/2008   COLONOSCOPY WITH PROPOFOL N/A 08/05/2018   Procedure: COLONOSCOPY WITH PROPOFOL;  Surgeon: Manya Silvas, MD;  Location: [facility] ENDOSCOPY;  Service: Endoscopy;  Laterality: N/A;   EGD  02/06/2008   GASTROC RECESSION EXTREMITY Left 12/15/2019   Procedure: GASTROC RECESSION EXTREMITY;  Surgeon: Samara Deist, DPM;  Location: [facility] ORS;  Service: Podiatry;  Laterality: Left;   HALLUX VALGUS CHEILECTOMY Left 08/21/2016   Procedure: HALLUX VALGUS CHEILECTOMY;  Surgeon: Sharlotte Alamo, DPM;  Location: [facility] ORS;  Service: Podiatry;  Laterality: Left;   HEMORRHOID SURGERY  10/2014  SPINE SURGERY     TONSILLECTOMY AND ADENOIDECTOMY       Family History  Problem Relation Age of Onset   Diabetes Father    Hyperlipidemia Father    Hypertension Father    Cancer Father    Heart attack Father    Breast cancer Sister 27   Breast cancer Maternal Aunt 65   Breast cancer Maternal Grandmother 19    Social History   Social History Narrative   Divorced since 2009.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   Social History   Tobacco Use   Smoking status: Former    Years: 8.00    Pack years: 0.00    Types: Cigarettes    Quit date: 04/09/1994    Years since quitting: 27.0    Smokeless tobacco: Never  Substance Use Topics   Alcohol use: No    Alcohol/week: 0.0 standard drinks    Current Meds  Medication Sig   acetaminophen (TYLENOL) 500 MG tablet Take 500 mg by mouth every 6 (six) hours as needed for mild pain or headache.    albuterol (VENTOLIN HFA) 108 (90 Base) MCG/ACT inhaler Inhale 2 inhalations into the lungs every 6 (six) hours as needed for Wheezing   atorvastatin (LIPITOR) 40 MG tablet Take 1 tablet (40 mg total) by mouth once daily   celecoxib (CELEBREX) 100 MG capsule Take 1 capsule (100 mg total) by mouth 2 (two) times daily.   cetirizine (ZYRTEC) 10 MG tablet Take 10 mg by mouth daily as needed (seasonal allergies.).    Cholecalciferol (VITAMIN D-3) 125 MCG (5000 UT) TABS Take 5,000 Units by mouth daily.   citalopram (CELEXA) 40 MG tablet TAKE 1 TABLET BY MOUTH ONCE DAILY   clonazePAM (KLONOPIN) 0.5 MG tablet Take 0.5 mg by mouth daily as needed for anxiety.   cyclobenzaprine (FLEXERIL) 5 MG tablet TAKE 1-2 TABLETS BY MOUTH 3 TIMES DAILY AS NEEDED FOR MUSCLE SPASMS   doxycycline (VIBRAMYCIN) 100 MG capsule Take 100 mg by mouth 2 (two) times daily as needed (acne flare ups).    DULoxetine (CYMBALTA) 60 MG capsule Take 1 capsule by mouth daily at 8 am.   fluticasone (FLOVENT HFA) 110 MCG/ACT inhaler Inhale 1 puff by mouth 2 (two) times daily   Fluticasone-Salmeterol (ADVAIR) 250-50 MCG/DOSE AEPB Inhale 1 puff into the lungs every 12 (twelve) hours as needed (seasonal allergies).    furosemide (LASIX) 40 MG tablet Take 40 mg by mouth daily.    glucose blood (FREESTYLE LITE) test strip use to test blood glucose once daily as directed   lamoTRIgine (LAMICTAL) 200 MG tablet Take 1 tablet by mouth once daily.   metoprolol succinate (TOPROL XL) 50 MG 24 hr tablet Take 1 tablet (50 mg total) by mouth 2 (two) times daily. Take with or immediately following a meal. (Patient taking differently: Take 25-50 mg by mouth See admin instructions. Take 50 mg in morning  and 25 mg in the afternoon)   NP THYROID 120 MG tablet TAKE 1 TABLET BY MOUTH AT BEDTIME. TAKE WITH 30 MG FOR A TOTAL OF 150 MG AT BEDTIME   NP THYROID 30 MG tablet TAKE 1 TABLET BY MOUTH DAILY BEFORE BREAKFAST.   progesterone (PROMETRIUM) 200 MG capsule TAKE 2 CAPSULES BY MOUTH EVERY NIGHT (Patient taking differently: Take 200 mg by mouth at bedtime.)   Semaglutide 7 MG TABS TAKE 1 TABLET BY MOUTH ONCE A DAY   spironolactone (ALDACTONE) 50 MG tablet TAKE 1 TABLET BY MOUTH DAILY  tiZANidine (ZANAFLEX) 4 MG tablet Take 1 tablet (4 mg total) by mouth 3 (three) times daily as needed       Objective:   Today's Vitals: BP 114/74   Pulse 82   Temp (!) 97.3 F (36.3 C) (Temporal)   Ht 5\' 2"  (1.575 m)   Wt 221 lb 12.8 oz (100.6 kg)   SpO2 97%   BMI 40.57 kg/m  Vitals with BMI 04/08/2021 01/06/2021 10/02/2020  Height 5\' 2"  5\' 2"  -  Weight 221 lbs 13 oz 219 lbs -  BMI 88.50 27.74 -  Systolic 128 786 767  Diastolic 74 86 62  Pulse 82 86 76  Some encounter information is confidential and restricted. Go to Review Flowsheets activity to see all data.     Physical Exam  She remains morbidly obese and in fact has gained a couple of pounds in weight since last visit.     Assessment   1. Hypothyroidism, adult   2. Diabetes mellitus without complication ([place])   3. Benign essential HTN   4. Mixed hyperlipidemia       Tests ordered Orders Placed This Encounter  Procedures   COMPLETE METABOLIC PANEL WITH GFR   Hemoglobin A1c   Lipid panel   T3, free   TSH      Plan: 1.  Continue with current dose of NP thyroid and we will check thyroid function.  I think we will need to optimize further. 2.  Continue with Rybelsus and we will check an A1c. 3.  Continue with antihypertensive therapy.  We will check renal function. 4.Continue with statin therapy for right now and we will check lipid panel.  I think I will need to discuss with her regarding the statin therapy in the  future. 5.I will see her in approximately a month for close monitoring and intensive therapy for weight loss.    No orders of the defined types were placed in this encounter.   Doree Albee, MD

## 2021-04-08 NOTE — Addendum Note (Signed)
Addended by: Anibal Henderson on: 04/08/2021 05:01 PM   Modules accepted: Orders

## 2021-04-09 ENCOUNTER — Other Ambulatory Visit (INDEPENDENT_AMBULATORY_CARE_PROVIDER_SITE_OTHER): Payer: Self-pay | Admitting: Internal Medicine

## 2021-04-09 ENCOUNTER — Other Ambulatory Visit (HOSPITAL_COMMUNITY): Payer: Self-pay

## 2021-04-09 ENCOUNTER — Encounter (INDEPENDENT_AMBULATORY_CARE_PROVIDER_SITE_OTHER): Payer: Self-pay | Admitting: Internal Medicine

## 2021-04-09 LAB — COMPLETE METABOLIC PANEL WITH GFR
AG Ratio: 1.7 (calc) (ref 1.0–2.5)
ALT: 16 U/L (ref 6–29)
AST: 17 U/L (ref 10–35)
Albumin: 4.2 g/dL (ref 3.6–5.1)
Alkaline phosphatase (APISO): 63 U/L (ref 31–125)
BUN: 12 mg/dL (ref 7–25)
CO2: 29 mmol/L (ref 20–32)
Calcium: 10.1 mg/dL (ref 8.6–10.2)
Chloride: 100 mmol/L (ref 98–110)
Creat: 0.79 mg/dL (ref 0.50–1.10)
GFR, Est African American: 103 mL/min/{1.73_m2} (ref >=60)
GFR, Est Non African American: 89 mL/min/{1.73_m2} (ref >=60)
Globulin: 2.5 g/dL (calc) (ref 1.9–3.7)
Glucose, Bld: 84 mg/dL (ref 65–139)
Potassium: 4.3 mmol/L (ref 3.5–5.3)
Sodium: 138 mmol/L (ref 135–146)
Total Bilirubin: 0.6 mg/dL (ref 0.2–1.2)
Total Protein: 6.7 g/dL (ref 6.1–8.1)

## 2021-04-09 LAB — LIPID PANEL
Cholesterol: 169 mg/dL (ref <200)
HDL: 44 mg/dL — ABNORMAL LOW (ref >=50)
LDL Cholesterol (Calc): 95 mg/dL (calc)
Non-HDL Cholesterol (Calc): 125 mg/dL (calc) (ref <130)
Total CHOL/HDL Ratio: 3.8 (calc) (ref <5.0)
Triglycerides: 199 mg/dL — ABNORMAL HIGH (ref <150)

## 2021-04-09 LAB — T3, FREE: T3, Free: 3.6 pg/mL (ref 2.3–4.2)

## 2021-04-09 LAB — HEMOGLOBIN A1C
Hgb A1c MFr Bld: 6.1 % of total Hgb — ABNORMAL HIGH (ref <5.7)
Mean Plasma Glucose: 128 mg/dL
eAG (mmol/L): 7.1 mmol/L

## 2021-04-09 LAB — TSH: TSH: <0.01 mIU/L — ABNORMAL LOW

## 2021-04-09 MED ORDER — SPIRONOLACTONE 100 MG PO TABS
100.0000 mg | ORAL_TABLET | Freq: Every day | ORAL | 1 refills | Status: DC
  Filled 2021-04-09: qty 90, 90d supply, fill #0

## 2021-04-09 MED ORDER — NP THYROID 90 MG PO TABS
90.0000 mg | ORAL_TABLET | Freq: Two times a day (BID) | ORAL | 3 refills | Status: DC
  Filled 2021-04-09: qty 60, 30d supply, fill #0
  Filled 2021-05-13: qty 60, 30d supply, fill #1

## 2021-04-09 MED FILL — Progesterone Cap 200 MG: ORAL | 30 days supply | Qty: 60 | Fill #1 | Status: AC

## 2021-05-06 ENCOUNTER — Other Ambulatory Visit (HOSPITAL_COMMUNITY): Payer: Self-pay

## 2021-05-08 ENCOUNTER — Other Ambulatory Visit: Payer: Self-pay

## 2021-05-08 MED ORDER — CIPROFLOXACIN HCL 250 MG PO TABS
ORAL_TABLET | ORAL | 0 refills | Status: DC
  Filled 2021-05-08: qty 10, 5d supply, fill #0

## 2021-05-12 ENCOUNTER — Other Ambulatory Visit: Payer: Self-pay

## 2021-05-12 ENCOUNTER — Ambulatory Visit (INDEPENDENT_AMBULATORY_CARE_PROVIDER_SITE_OTHER): Payer: No Typology Code available for payment source | Admitting: Internal Medicine

## 2021-05-12 ENCOUNTER — Encounter (INDEPENDENT_AMBULATORY_CARE_PROVIDER_SITE_OTHER): Payer: Self-pay | Admitting: Internal Medicine

## 2021-05-12 ENCOUNTER — Other Ambulatory Visit (INDEPENDENT_AMBULATORY_CARE_PROVIDER_SITE_OTHER): Payer: Self-pay | Admitting: Internal Medicine

## 2021-05-12 VITALS — BP 118/72 | HR 68 | Temp 97.2°F | Ht 62.0 in | Wt 222.8 lb

## 2021-05-12 DIAGNOSIS — E119 Type 2 diabetes mellitus without complications: Secondary | ICD-10-CM | POA: Diagnosis not present

## 2021-05-12 DIAGNOSIS — I10 Essential (primary) hypertension: Secondary | ICD-10-CM

## 2021-05-12 DIAGNOSIS — E039 Hypothyroidism, unspecified: Secondary | ICD-10-CM | POA: Diagnosis not present

## 2021-05-12 NOTE — Progress Notes (Signed)
Metrics: Intervention Frequency ACO  Documented Smoking Status Yearly  Screened one or more times in 24 months  Cessation Counseling or  Active cessation medication Past 24 months  Past 24 months   Guideline developer: UpToDate (See UpToDate for funding source) Date Released: 2014       Wellness Office Visit  Subjective:  Patient ID: Heather Burnett, female    DOB: July 06, 1971  Age: 50 y.o. MRN: [id number]  CC: This lady comes in for follow-up of hypothyroidism and morbid obesity. HPI  She has tolerated the higher dose of NP thyroid 90 mg twice a day.  However, she has not lost any further weight.  She tells me that she eats 1 meal a day and drinks water all day long.  I think she is still under a lot of stress although she says this is improving. Past Medical History:  Diagnosis Date   Allergy    Anxiety    Asthma    WELL CONTROLLED   Cancer ([place])    melanoma on my chest   COPD (chronic obstructive pulmonary disease) ([place])    not sure about COPD    DDD (degenerative disc disease), cervical    and lower back   Depression    DJD (degenerative joint disease)    Dysrhythmia    Empty sella syndrome ([place])    followed at [facility]   Family history of adverse reaction to anesthesia    mother almost died with anesthesia in the past, hard to wake up and got real sick on my stomach vomiting, now she receives local anethesia.   GERD (gastroesophageal reflux disease)    Headache    history of migraine   Heart murmur    History of methicillin resistant staphylococcus aureus (MRSA)    History of palpitations 07/2004   Hyperlipidemia    Hypertension    Hypothyroidism    Hypothyroidism, adult 02/08/2020   Lymphocytosis    Neuropathy 2016   PCOS (polycystic ovarian syndrome)    Pituitary microadenoma ([place])    followed at [facility]   Pneumonia 2008   Pre-diabetes    Sleep apnea    USES CPAP   Thyroid disease    Von Willebrand disease ([place]) 2012   PT STATES SHE HAS NEVER HAD ANY ISSUES WITH  BLEEDING   Past Surgical History:  Procedure Laterality Date   ACHILLES TENDON SURGERY Left 12/15/2019   Procedure: ACHILLES TENDON REPAIR PRIMARY;  Surgeon: Samara Deist, DPM;  Location: [facility] ORS;  Service: Podiatry;  Laterality: Left;   ANTERIOR CERVICAL DECOMP/DISCECTOMY FUSION N/A 10/02/2020   Procedure: ANTERIOR CERVICAL DECOMPRESSION/DISCECTOMY FUSION 1 LEVEL C6-7;  Surgeon: Meade Maw, MD;  Location: [facility] ORS;  Service: Neurosurgery;  Laterality: N/A;   BACK SURGERY     removal of 2 lumbar vertebrae   BREAST BIOPSY Right 2010   NEG   CHOLECYSTECTOMY     COLONOSCOPY  03/03/2013, 02/06/2008   COLONOSCOPY WITH PROPOFOL N/A 08/05/2018   Procedure: COLONOSCOPY WITH PROPOFOL;  Surgeon: Manya Silvas, MD;  Location: [facility] ENDOSCOPY;  Service: Endoscopy;  Laterality: N/A;   EGD  02/06/2008   GASTROC RECESSION EXTREMITY Left 12/15/2019   Procedure: GASTROC RECESSION EXTREMITY;  Surgeon: Samara Deist, DPM;  Location: [facility] ORS;  Service: Podiatry;  Laterality: Left;   HALLUX VALGUS CHEILECTOMY Left 08/21/2016   Procedure: HALLUX VALGUS CHEILECTOMY;  Surgeon: Sharlotte Alamo, DPM;  Location: [facility] ORS;  Service: Podiatry;  Laterality: Left;   HEMORRHOID SURGERY  10/2014   SPINE SURGERY  TONSILLECTOMY AND ADENOIDECTOMY       Family History  Problem Relation Age of Onset   Diabetes Father    Hyperlipidemia Father    Hypertension Father    Cancer Father    Heart attack Father    Breast cancer Sister 35   Breast cancer Maternal Aunt 64   Breast cancer Maternal Grandmother 60    Social History   Social History Narrative   Divorced since 2009.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   Social History   Tobacco Use   Smoking status: Former    Years: 8.00    Types: Cigarettes    Quit date: 04/09/1994    Years since quitting: 27.1   Smokeless tobacco: Never  Substance Use Topics   Alcohol use: No    Alcohol/week: 0.0 standard drinks    Current Meds   Medication Sig   acetaminophen (TYLENOL) 500 MG tablet Take 500 mg by mouth every 6 (six) hours as needed for mild pain or headache.    albuterol (VENTOLIN HFA) 108 (90 Base) MCG/ACT inhaler Inhale 2 inhalations into the lungs every 6 (six) hours as needed for Wheezing   atorvastatin (LIPITOR) 40 MG tablet Take 1 tablet (40 mg total) by mouth once daily   celecoxib (CELEBREX) 100 MG capsule Take 1 capsule (100 mg total) by mouth 2 (two) times daily.   cetirizine (ZYRTEC) 10 MG tablet Take 10 mg by mouth daily as needed (seasonal allergies.).    Cholecalciferol (VITAMIN D-3) 125 MCG (5000 UT) TABS Take 5,000 Units by mouth daily.   ciprofloxacin (CIPRO) 250 MG tablet Take 1 tablet (250 mg total) by mouth 2 (two) times daily for 5 days   citalopram (CELEXA) 40 MG tablet TAKE 1 TABLET BY MOUTH ONCE DAILY   clonazePAM (KLONOPIN) 0.5 MG tablet Take 0.5 mg by mouth daily as needed for anxiety.   cyclobenzaprine (FLEXERIL) 5 MG tablet TAKE 1-2 TABLETS BY MOUTH 3 TIMES DAILY AS NEEDED FOR MUSCLE SPASMS   doxycycline (VIBRAMYCIN) 100 MG capsule Take 100 mg by mouth 2 (two) times daily as needed (acne flare ups).    DULoxetine (CYMBALTA) 60 MG capsule Take 1 capsule by mouth daily at 8 am.   fluticasone (FLOVENT HFA) 110 MCG/ACT inhaler Inhale 1 puff by mouth 2 (two) times daily   Fluticasone-Salmeterol (ADVAIR) 250-50 MCG/DOSE AEPB Inhale 1 puff into the lungs every 12 (twelve) hours as needed (seasonal allergies).    furosemide (LASIX) 40 MG tablet Take 40 mg by mouth daily.    glucose blood (FREESTYLE LITE) test strip use to test blood glucose once daily as directed   lamoTRIgine (LAMICTAL) 200 MG tablet Take 1 tablet by mouth once daily.   metoprolol succinate (TOPROL XL) 50 MG 24 hr tablet Take 1 tablet (50 mg total) by mouth 2 (two) times daily. Take with or immediately following a meal. (Patient taking differently: Take 25-50 mg by mouth See admin instructions. Take 50 mg in morning and 25 mg in the  afternoon)   NP THYROID 90 MG tablet Take 1 tablet (90 mg total) by mouth 2 (two) times daily.   Semaglutide 7 MG TABS TAKE 1 TABLET BY MOUTH ONCE A DAY   spironolactone (ALDACTONE) 100 MG tablet Take 1 tablet (100 mg total) by mouth daily.   tiZANidine (ZANAFLEX) 4 MG tablet Take 1 tablet (4 mg total) by mouth 3 (three) times daily as needed     [facility] Visit from 05/12/2021 in [place]  [facility]  PHQ-9 Total Score 12       Objective:   Today's Vitals: BP 118/72   Pulse 68   Temp (!) 97.2 F (36.2 C) (Temporal)   Ht '5\' 2"'$  (1.575 m)   Wt 222 lb 12.8 oz (101.1 kg)   SpO2 98%   BMI 40.75 kg/m  Vitals with BMI 05/12/2021 04/08/2021 01/06/2021  Height '5\' 2"'$  '5\' 2"'$  '5\' 2"'$   Weight 222 lbs 13 oz 221 lbs 13 oz 219 lbs  BMI 40.74 [id number]  Systolic [id number]  Diastolic 72 74 86  Pulse 68 82 86  Some encounter information is confidential and restricted. Go to Review Flowsheets activity to see all data.     Physical Exam  She remains morbidly obese and has not lost any weight recently.     Assessment   1. Hypothyroidism, adult   2. Diabetes mellitus without complication ([place])   3. Benign essential HTN       Tests ordered Orders Placed This Encounter  Procedures   T3, free      Plan: 1.  Continue with the same dose of NP thyroid and we will check free T3 level now.  We may need to adjust the thyroid doses again. 2.  I will see her in 3 months for follow-up.    No orders of the defined types were placed in this encounter.   Doree Albee, MD

## 2021-05-13 ENCOUNTER — Other Ambulatory Visit (HOSPITAL_COMMUNITY): Payer: Self-pay

## 2021-05-13 ENCOUNTER — Other Ambulatory Visit: Payer: Self-pay

## 2021-05-13 ENCOUNTER — Ambulatory Visit (INDEPENDENT_AMBULATORY_CARE_PROVIDER_SITE_OTHER): Payer: No Typology Code available for payment source | Admitting: Internal Medicine

## 2021-05-13 LAB — T3, FREE: T3, Free: 4.8 pg/mL — ABNORMAL HIGH (ref 2.3–4.2)

## 2021-05-13 MED ORDER — PROGESTERONE 200 MG PO CAPS
ORAL_CAPSULE | ORAL | 3 refills | Status: DC
  Filled 2021-05-13: qty 60, 30d supply, fill #0
  Filled 2021-05-13: qty 60, 30d supply, fill #1

## 2021-05-14 ENCOUNTER — Other Ambulatory Visit (HOSPITAL_COMMUNITY): Payer: Self-pay

## 2021-05-15 ENCOUNTER — Other Ambulatory Visit: Payer: Self-pay

## 2021-05-28 ENCOUNTER — Other Ambulatory Visit (HOSPITAL_COMMUNITY): Payer: Self-pay

## 2021-05-28 ENCOUNTER — Telehealth (INDEPENDENT_AMBULATORY_CARE_PROVIDER_SITE_OTHER): Payer: Self-pay | Admitting: Nurse Practitioner

## 2021-05-28 DIAGNOSIS — E282 Polycystic ovarian syndrome: Secondary | ICD-10-CM

## 2021-05-28 DIAGNOSIS — I10 Essential (primary) hypertension: Secondary | ICD-10-CM

## 2021-05-28 DIAGNOSIS — E039 Hypothyroidism, unspecified: Secondary | ICD-10-CM

## 2021-05-28 MED ORDER — SPIRONOLACTONE 100 MG PO TABS
100.0000 mg | ORAL_TABLET | Freq: Every day | ORAL | 0 refills | Status: DC
  Filled 2021-05-28 – 2021-07-15 (×2): qty 90, 90d supply, fill #0

## 2021-05-28 MED ORDER — THYROID 60 MG PO TABS
60.0000 mg | ORAL_TABLET | Freq: Every day | ORAL | 0 refills | Status: DC
  Filled 2021-05-28: qty 90, 90d supply, fill #0

## 2021-05-28 MED ORDER — PROGESTERONE 200 MG PO CAPS
ORAL_CAPSULE | ORAL | 1 refills | Status: DC
  Filled 2021-05-28: qty 60, 30d supply, fill #0
  Filled 2021-07-15: qty 60, 30d supply, fill #1

## 2021-05-28 NOTE — Telephone Encounter (Signed)
Please call patient and let her know that based on chart review I will refill more of her NP thyroid but I am going to reduce the dose slightly to 60 mg by mouth daily due to her last blood work showing suppressed TSH.  She should keep her upcoming appoint with endocrinology as scheduled to they can follow-up with repeating lab work to make sure this dose is a good one for her.  I have also refilled progesterone and spironolactone.  Thank you.

## 2021-05-29 ENCOUNTER — Encounter (INDEPENDENT_AMBULATORY_CARE_PROVIDER_SITE_OTHER): Payer: Self-pay | Admitting: Nurse Practitioner

## 2021-05-29 ENCOUNTER — Other Ambulatory Visit (INDEPENDENT_AMBULATORY_CARE_PROVIDER_SITE_OTHER): Payer: Self-pay | Admitting: Nurse Practitioner

## 2021-05-29 ENCOUNTER — Other Ambulatory Visit (HOSPITAL_COMMUNITY): Payer: Self-pay

## 2021-05-29 DIAGNOSIS — E039 Hypothyroidism, unspecified: Secondary | ICD-10-CM

## 2021-05-29 MED ORDER — THYROID 60 MG PO TABS
60.0000 mg | ORAL_TABLET | Freq: Every day | ORAL | 0 refills | Status: DC
  Filled 2021-05-29: qty 90, 90d supply, fill #0

## 2021-05-30 ENCOUNTER — Other Ambulatory Visit (HOSPITAL_COMMUNITY): Payer: Self-pay

## 2021-05-30 MED ORDER — CITALOPRAM HYDROBROMIDE 20 MG PO TABS
20.0000 mg | ORAL_TABLET | Freq: Every day | ORAL | 0 refills | Status: DC
  Filled 2021-05-30: qty 90, 90d supply, fill #0

## 2021-05-30 MED ORDER — HYDROXYZINE PAMOATE 25 MG PO CAPS
25.0000 mg | ORAL_CAPSULE | Freq: Two times a day (BID) | ORAL | 0 refills | Status: DC | PRN
  Filled 2021-05-30: qty 180, 90d supply, fill #0

## 2021-06-02 NOTE — Telephone Encounter (Signed)
Called patient and gave her the message from Sarah. Patient verbalized an understanding and thanked us. 

## 2021-06-06 ENCOUNTER — Other Ambulatory Visit (HOSPITAL_COMMUNITY): Payer: Self-pay

## 2021-07-11 ENCOUNTER — Other Ambulatory Visit (HOSPITAL_COMMUNITY): Payer: Self-pay

## 2021-07-11 MED ORDER — LAMOTRIGINE 25 MG PO TABS
ORAL_TABLET | ORAL | 0 refills | Status: DC
  Filled 2021-07-11: qty 60, 30d supply, fill #0

## 2021-07-14 ENCOUNTER — Encounter: Payer: Self-pay | Admitting: "Endocrinology

## 2021-07-14 ENCOUNTER — Ambulatory Visit (INDEPENDENT_AMBULATORY_CARE_PROVIDER_SITE_OTHER): Payer: No Typology Code available for payment source | Admitting: "Endocrinology

## 2021-07-14 ENCOUNTER — Other Ambulatory Visit: Payer: Self-pay

## 2021-07-14 ENCOUNTER — Other Ambulatory Visit (HOSPITAL_COMMUNITY): Payer: Self-pay

## 2021-07-14 VITALS — BP 120/82 | HR 72 | Ht 62.0 in | Wt 229.8 lb

## 2021-07-14 DIAGNOSIS — E0580 Other thyrotoxicosis without thyrotoxic crisis or storm: Secondary | ICD-10-CM | POA: Diagnosis not present

## 2021-07-14 DIAGNOSIS — E1169 Type 2 diabetes mellitus with other specified complication: Secondary | ICD-10-CM | POA: Insufficient documentation

## 2021-07-14 DIAGNOSIS — E119 Type 2 diabetes mellitus without complications: Secondary | ICD-10-CM

## 2021-07-14 DIAGNOSIS — E039 Hypothyroidism, unspecified: Secondary | ICD-10-CM | POA: Diagnosis not present

## 2021-07-14 MED ORDER — SYNTHROID 100 MCG PO TABS
100.0000 ug | ORAL_TABLET | Freq: Every day | ORAL | 2 refills | Status: DC
  Filled 2021-07-14: qty 30, 30d supply, fill #0
  Filled 2021-07-31 – 2021-08-06 (×2): qty 30, 30d supply, fill #1

## 2021-07-14 NOTE — Progress Notes (Signed)
Endocrinology Consult Note                                            07/14/2021, 5:49 PM   Subjective:    Patient ID: Heather Burnett, female    DOB: [birth year], PCP Adin Hector, MD   Past Medical History:  Diagnosis Date   Allergy    Anxiety    Asthma    WELL CONTROLLED   Cancer ([place])    melanoma on my chest   COPD (chronic obstructive pulmonary disease) ([place])    not sure about COPD    DDD (degenerative disc disease), cervical    and lower back   Depression    Diabetes mellitus, type II ([place])    DJD (degenerative joint disease)    Dysrhythmia    Empty sella syndrome ([place])    followed at [place] history of adverse reaction to anesthesia    mother almost died with anesthesia in the past, hard to wake up and got real sick on my stomach vomiting, now she receives local anethesia.   GERD (gastroesophageal reflux disease)    Headache    history of migraine   Heart murmur    History of methicillin resistant staphylococcus aureus (MRSA)    History of palpitations 07/2004   Hyperlipidemia    Hypertension    Hypothyroidism    Hypothyroidism, adult 02/08/2020   Lymphocytosis    Neuropathy 2016   Osteoporosis    PCOS (polycystic ovarian syndrome)    Pituitary microadenoma ([place])    followed at [facility]   Pneumonia 2008   Pre-diabetes    Sleep apnea    USES CPAP   Thyroid disease    Von Willebrand disease ([place]) 2012   PT STATES SHE HAS NEVER HAD ANY ISSUES WITH BLEEDING   Past Surgical History:  Procedure Laterality Date   ACHILLES TENDON SURGERY Left 12/15/2019   Procedure: ACHILLES TENDON REPAIR PRIMARY;  Surgeon: Samara Deist, DPM;  Location: [facility] ORS;  Service: Podiatry;  Laterality: Left;   ANTERIOR CERVICAL DECOMP/DISCECTOMY FUSION N/A 10/02/2020   Procedure: ANTERIOR CERVICAL DECOMPRESSION/DISCECTOMY FUSION 1 LEVEL C6-7;  Surgeon: Meade Maw, MD;  Location: [facility] ORS;  Service: Neurosurgery;  Laterality: N/A;   BACK SURGERY     removal  of 2 lumbar vertebrae   BREAST BIOPSY Right 2010   NEG   CHOLECYSTECTOMY     COLONOSCOPY  03/03/2013, 02/06/2008   COLONOSCOPY WITH PROPOFOL N/A 08/05/2018   Procedure: COLONOSCOPY WITH PROPOFOL;  Surgeon: Manya Silvas, MD;  Location: [facility] ENDOSCOPY;  Service: Endoscopy;  Laterality: N/A;   EGD  02/06/2008   GASTROC RECESSION EXTREMITY Left 12/15/2019   Procedure: GASTROC RECESSION EXTREMITY;  Surgeon: Samara Deist, DPM;  Location: [facility] ORS;  Service: Podiatry;  Laterality: Left;   HALLUX VALGUS CHEILECTOMY Left 08/21/2016   Procedure: HALLUX VALGUS CHEILECTOMY;  Surgeon: Sharlotte Alamo, DPM;  Location: [facility] ORS;  Service: Podiatry;  Laterality: Left;   HEMORRHOID SURGERY  10/2014   SPINE SURGERY     TONSILLECTOMY AND ADENOIDECTOMY     Social History   Socioeconomic History   Marital status: Divorced    Spouse name: Not on file   Number of children: Not on file   Years of education: Not on file   Highest education level: Not on file  Occupational History  Not on file  Tobacco Use   Smoking status: Former    Years: 8.00    Types: Cigarettes    Quit date: 04/09/1994    Years since quitting: 27.2   Smokeless tobacco: Never  Vaping Use   Vaping Use: Never used  Substance and Sexual Activity   Alcohol use: Yes    Comment: social   Drug use: No   Sexual activity: Not on file  Other Topics Concern   Not on file  Social History Narrative   Divorced since 2009.Lives with 48 year old mum.CMA at [employer].   Social Determinants of Health   Financial Resource Strain: Not on file  Food Insecurity: Not on file  Transportation Needs: Not on file  Physical Activity: Not on file  Stress: Not on file  Social Connections: Not on file   Family History  Problem Relation Age of Onset   Thyroid disease Mother    Osteoporosis Mother    Diabetes Father    Hyperlipidemia Father    Hypertension Father    Cancer Father    Heart attack Father    Breast cancer Sister 92   Breast  cancer Maternal Aunt 63   Breast cancer Maternal Grandmother 82   Outpatient Encounter Medications as of 07/14/2021  Medication Sig   SYNTHROID 100 MCG tablet Take 1 tablet (100 mcg total) by mouth daily before breakfast.   acetaminophen (TYLENOL) 500 MG tablet Take 500 mg by mouth every 6 (six) hours as needed for mild pain or headache.    albuterol (VENTOLIN HFA) 108 (90 Base) MCG/ACT inhaler Inhale 2 inhalations into the lungs every 6 (six) hours as needed for Wheezing   atorvastatin (LIPITOR) 40 MG tablet Take 1 tablet (40 mg total) by mouth once daily   celecoxib (CELEBREX) 100 MG capsule Take 1 capsule (100 mg total) by mouth 2 (two) times daily.   cetirizine (ZYRTEC) 10 MG tablet Take 10 mg by mouth daily as needed (seasonal allergies.).    Cholecalciferol (VITAMIN D-3) 125 MCG (5000 UT) TABS Take 5,000 Units by mouth daily.   citalopram (CELEXA) 20 MG tablet Take 1 tablet (20 mg total) by mouth daily.   clonazePAM (KLONOPIN) 0.5 MG tablet Take 0.5 mg by mouth daily as needed for anxiety.   cyclobenzaprine (FLEXERIL) 5 MG tablet TAKE 1-2 TABLETS BY MOUTH 3 TIMES DAILY AS NEEDED FOR MUSCLE SPASMS   doxycycline (VIBRAMYCIN) 100 MG capsule Take 100 mg by mouth 2 (two) times daily as needed (acne flare ups).    DULoxetine (CYMBALTA) 60 MG capsule Take 1 capsule by mouth daily at 8 am.   fluticasone (FLOVENT HFA) 110 MCG/ACT inhaler Inhale 1 puff by mouth 2 (two) times daily (Patient taking differently: Inhale into the lungs as needed.)   Fluticasone-Salmeterol (ADVAIR) 250-50 MCG/DOSE AEPB Inhale 1 puff into the lungs every 12 (twelve) hours as needed (seasonal allergies).    furosemide (LASIX) 40 MG tablet Take 40 mg by mouth daily.    glucose blood (FREESTYLE LITE) test strip use to test blood glucose once daily as directed   hydrOXYzine (VISTARIL) 25 MG capsule Take 1 capsule (25 mg total) by mouth 2 (two) times daily as needed.   lamoTRIgine (LAMICTAL) 200 MG tablet Take 1 tablet by mouth  once daily.   lamoTRIgine (LAMICTAL) 25 MG tablet Take 2 tablets by mouth nightly   metoprolol succinate (TOPROL XL) 50 MG 24 hr tablet Take 1 tablet (50 mg total) by mouth 2 (two) times daily.  Take with or immediately following a meal. (Patient taking differently: Take 25-50 mg by mouth See admin instructions. Take 50 mg in morning and 25 mg in the afternoon as needed)   progesterone (PROMETRIUM) 200 MG capsule TAKE 2 CAPSULES BY MOUTH EVERY NIGHT (Patient taking differently: Take 200 mg by mouth.)   Semaglutide 7 MG TABS TAKE 1 TABLET BY MOUTH ONCE A DAY   spironolactone (ALDACTONE) 100 MG tablet Take 1 tablet (100 mg total) by mouth daily.   tiZANidine (ZANAFLEX) 4 MG tablet Take 1 tablet (4 mg total) by mouth 3 (three) times daily as needed   [DISCONTINUED] ciprofloxacin (CIPRO) 250 MG tablet Take 1 tablet (250 mg total) by mouth 2 (two) times daily for 5 days   [DISCONTINUED] citalopram (CELEXA) 40 MG tablet TAKE 1 TABLET BY MOUTH ONCE DAILY   [DISCONTINUED] progesterone (PROMETRIUM) 200 MG capsule TAKE 2 CAPSULES BY MOUTH EVERY NIGHT (Patient taking differently: Take 200 mg by mouth at bedtime. )   [DISCONTINUED] thyroid (NP THYROID) 60 MG tablet Take 1 tablet (60 mg total) by mouth daily before breakfast. (Patient taking differently: Take 120 mg by mouth daily before breakfast.)   No facility-administered encounter medications on file as of 07/14/2021.   ALLERGIES: Allergies  Allergen Reactions   Liraglutide -Weight Management Other (See Comments)    GI upset   Polysorbate    Sorbitan Other (See Comments)    GI upset   Adhesive [Tape] Itching and Rash    Paper tape or coban is easier on the skin.   Latex Rash   Penicillins Rash    Updated on 09/26/2020 following APP conversation with patient.  Swelling of the face/tongue/throat, SOB, or low BP? NO Sudden or severe rash/hives, skin peeling, or any reaction on the inside of your mouth or nose? NO - low severity rash to body.  Seek  medical attention at a hospital or doctor's office? Yes When did it last happen? Age 64 following "a shot of PCN".       If all above answers are "NO", may proceed with cephalosporin use. - Has taken cefdinir and cephalexin without issues.      VACCINATION STATUS: Immunization History  Administered Date(s) Administered   Influenza Split 08/02/2015, 07/19/2018   Influenza,inj,Quad PF,6+ Mos 07/22/2017   Influenza-Unspecified 06/19/2020   Tdap 11/12/2010, 04/08/2021    HPI PAISYN GUERCIO is 50 y.o. female who presents today with a medical history as above. she is being seen in consultation for hypothyroidism requested by Adin Hector, MD.  History was obtained from the patient as well as chart review.  She was diagnosed with hypothyroidism at approximate age of 77 years.  She was treated with various dose of thyroid hormone formulations.  She is currently on NP thyroid 120 mg daily.  Review of her records shows all labs done so far are consistent with excessive replacement.  She describes anxiety, tremors, palpitations, hot flashes, and fluctuating energy and weight levels.  she denies dysphagia, shortness of breath nor voice change.  She has family history of various types of thyroid dysfunction, however no thyroid malignancy.  She did not have any thyroid imaging.  She has well-controlled type 2 diabetes with A1c of 6.1% currently taking Rybelsus 7 mg p.o. daily. She denies any exposure to thyroid iodine ablation nor thyroidectomy.  Review of Systems  Constitutional:  + Fluctuating body weight, + fatigue, + subjective hyperthermia, no subjective hypothermia Eyes: no blurry vision, no xerophthalmia ENT: no sore throat,  no nodules palpated in throat, no dysphagia/odynophagia, no hoarseness Cardiovascular: no Chest Pain, no Shortness of Breath, + palpitations, no leg swelling Respiratory: no cough, no shortness of breath Gastrointestinal: no Nausea/Vomiting/Diarhhea Musculoskeletal:  no muscle/joint aches Skin: no rashes Neurological: +tremors, no numbness, no tingling, no dizziness Psychiatric: no depression, + anxiety  Objective:    Vitals with BMI 07/14/2021 05/12/2021 04/08/2021  Height 5\' 2"  5\' 2"  5\' 2"   Weight 229 lbs 13 oz 222 lbs 13 oz 221 lbs 13 oz  BMI 42.02 30.16 01.09  Systolic 323 557 322  Diastolic 82 72 74  Pulse 72 68 82  Some encounter information is confidential and restricted. Go to Review Flowsheets activity to see all data.    BP 120/82   Pulse 72   Ht 5\' 2"  (1.575 m)   Wt 229 lb 12.8 oz (104.2 kg)   BMI 42.03 kg/m   Wt Readings from Last 3 Encounters:  07/14/21 229 lb 12.8 oz (104.2 kg)  05/12/21 222 lb 12.8 oz (101.1 kg)  04/08/21 221 lb 12.8 oz (100.6 kg)    Physical Exam  Constitutional:  Body mass index is 42.03 kg/m.,  not in acute distress, normal state of mind Eyes: PERRLA, EOMI, no exophthalmos ENT: moist mucous membranes, no gross thyromegaly, no gross cervical lymphadenopathy Cardiovascular: normal precordial activity, Regular Rate and Rhythm, no Murmur/Rubs/Gallops Respiratory:  adequate breathing efforts, no gross chest deformity, Clear to auscultation bilaterally Gastrointestinal: abdomen soft, Non -tender, No distension, Bowel Sounds present, no gross organomegaly Musculoskeletal: no gross deformities, strength intact in all four extremities Skin: moist, warm, no rashes Neurological: no tremor with outstretched hands, Deep tendon reflexes normal in bilateral lower extremities.  CMP ( most recent) CMP     Component Value Date/Time   NA 138 04/08/2021 1659   K 4.3 04/08/2021 1659   CL 100 04/08/2021 1659   CO2 29 04/08/2021 1659   GLUCOSE 84 04/08/2021 1659   BUN 12 04/08/2021 1659   CREATININE 0.79 04/08/2021 1659   CALCIUM 10.1 04/08/2021 1659   PROT 6.7 04/08/2021 1659   ALBUMIN 3.8 07/21/2018 0745   AST 17 04/08/2021 1659   ALT 16 04/08/2021 1659   ALKPHOS 43 07/21/2018 0745   BILITOT 0.6 04/08/2021 1659    GFRNONAA 89 04/08/2021 1659   GFRAA 103 04/08/2021 1659     Diabetic Labs (most recent): Lab Results  Component Value Date   HGBA1C 6.1 (H) 04/08/2021   HGBA1C 6.1 (H) 06/17/2020   HGBA1C 6.3 (H) 03/12/2020     Lipid Panel ( most recent) Lipid Panel     Component Value Date/Time   CHOL 169 04/08/2021 1659   TRIG 199 (H) 04/08/2021 1659   HDL 44 (L) 04/08/2021 1659   CHOLHDL 3.8 04/08/2021 1659   VLDL 33 07/11/2015 0755   LDLCALC 95 04/08/2021 1659      Lab Results  Component Value Date   TSH <0.01 (L) 04/08/2021   TSH 1.137 07/21/2018   TSH 0.944 07/11/2015   TSH 1.191 04/30/2015    Labs from outside facilities: May 08, 2021 TSH less than 0.01, November 12, 2020 TSH less than 0.01, January 11, 2020 TSH 0.759, July 14, 2019 TSH less than 0.01.  Assessment & Plan:   1. Hypothyroidism, unspecified type 2.  Iatrogenic thyrotoxicosis 3.  Type 2 diabetes, controlled with A1c of 6.1%.  - RINA ADNEY  is being seen at a kind request of Adin Hector, MD. - I have reviewed her  available thyroid records and clinically evaluated the patient. - Based on these reviews, she has longstanding hypothyroidism, currently on excessive thyroid hormone replacement causing clinical thyrotoxicosis.  This is consistent with iatrogenic thyrotoxicosis.  I had a long discussion with the patient about safe use of levothyroxine.  Part of the problem if she is taking NP thyroid which is difficult to adjust using current thyroid hormone assays.  She is open to switch to Synthroid which will be easier to measure and dose appropriately.  I discussed initiated Synthroid 100 mcg p.o. daily before breakfast instead of her NP thyroid 120 mg p.o. daily.   - We discussed about the correct intake of her thyroid hormone, on empty stomach at fasting, with water, separated by at least 30 minutes from breakfast and other medications,  and separated by more than 4 hours from calcium, iron,  multivitamins, acid reflux medications (PPIs). -Patient is made aware of the fact that thyroid hormone replacement is needed for life, dose to be adjusted by periodic monitoring of thyroid function tests.  -She will benefit from a baseline thyroid ultrasound, discussed and ordered to be done at [facility].  Regarding her type 2 diabetes which appears to be well controlled with A1c of 6.1%.  She is advised to continue Rybelsus 7 mg p.o. daily.  - she acknowledges that there is a room for improvement in her food and drink choices. - Suggestion is made for her to avoid simple carbohydrates  from her diet including Cakes, Sweet Desserts, Ice Cream, Soda (diet and regular), Sweet Tea, Candies, Chips, Cookies, Store Bought Juices, Alcohol in Excess of  1-2 drinks a day, Artificial Sweeteners,  Coffee Creamer, and "Sugar-free" Products, Lemonade. This will help patient to have more stable blood glucose profile and potentially avoid unintended weight gain. - she is advised to maintain close follow up with Adin Hector, MD for primary care needs.   - Time spent with the patient: 60 minutes, of which >50% was spent in  counseling her about her hypothyroidism, type 2 diabetes and the rest in obtaining information about her symptoms, reviewing her previous labs/studies ( including abstractions from other facilities),  evaluations, and treatments,  and developing a plan to confirm diagnosis and long term treatment based on the latest standards of care/guidelines; and documenting her care.  Heather Burnett participated in the discussions, expressed understanding, and voiced agreement with the above plans.  All questions were answered to her satisfaction. she is encouraged to contact clinic should she have any questions or concerns prior to her return visit.  Follow up plan: Return in about 3 months (around 10/13/2021) for F/U with Pre-visit Labs, Thyroid / Neck Ultrasound.   Glade Lloyd, MD [facility] [address] [place], [place] [address] Phone: [phone]  Fax: [phone]     07/14/2021, 5:49 PM  This note was partially dictated with voice recognition software. Similar sounding words can be transcribed inadequately or may not  be corrected upon review.

## 2021-07-14 NOTE — Patient Instructions (Signed)

## 2021-07-15 ENCOUNTER — Other Ambulatory Visit (HOSPITAL_COMMUNITY): Payer: Self-pay

## 2021-07-15 MED FILL — Semaglutide Tab 7 MG: ORAL | 90 days supply | Qty: 90 | Fill #1 | Status: AC

## 2021-07-16 ENCOUNTER — Other Ambulatory Visit (HOSPITAL_COMMUNITY): Payer: Self-pay

## 2021-07-17 ENCOUNTER — Other Ambulatory Visit (HOSPITAL_COMMUNITY): Payer: Self-pay

## 2021-07-25 ENCOUNTER — Ambulatory Visit: Payer: No Typology Code available for payment source

## 2021-07-31 ENCOUNTER — Other Ambulatory Visit (HOSPITAL_COMMUNITY): Payer: Self-pay

## 2021-08-06 ENCOUNTER — Other Ambulatory Visit (HOSPITAL_COMMUNITY): Payer: Self-pay

## 2021-08-07 ENCOUNTER — Other Ambulatory Visit (HOSPITAL_COMMUNITY): Payer: Self-pay

## 2021-08-07 ENCOUNTER — Telehealth: Payer: Self-pay | Admitting: "Endocrinology

## 2021-08-07 ENCOUNTER — Other Ambulatory Visit: Payer: Self-pay | Admitting: "Endocrinology

## 2021-08-07 MED ORDER — SYNTHROID 88 MCG PO TABS
88.0000 ug | ORAL_TABLET | Freq: Every day | ORAL | 2 refills | Status: DC
  Filled 2021-08-07: qty 30, 30d supply, fill #0
  Filled 2021-09-17: qty 30, 30d supply, fill #1
  Filled 2021-10-21: qty 30, 30d supply, fill #2

## 2021-08-07 NOTE — Telephone Encounter (Signed)
Informed pt .

## 2021-08-07 NOTE — Telephone Encounter (Signed)
Pt had labs done by her other doctor and she wants you to look at the thyroid lab that was done and see if any of her medications need to be adjusted

## 2021-08-13 ENCOUNTER — Other Ambulatory Visit (HOSPITAL_COMMUNITY): Payer: Self-pay

## 2021-08-13 MED ORDER — SPIRONOLACTONE 25 MG PO TABS
25.0000 mg | ORAL_TABLET | Freq: Every day | ORAL | 11 refills | Status: DC
  Filled 2021-08-13: qty 30, 30d supply, fill #0
  Filled 2021-09-17: qty 30, 30d supply, fill #1
  Filled 2021-10-21: qty 30, 30d supply, fill #2
  Filled 2021-11-28: qty 30, 30d supply, fill #3
  Filled 2021-12-24: qty 30, 30d supply, fill #4

## 2021-08-13 MED ORDER — METOPROLOL TARTRATE 50 MG PO TABS
50.0000 mg | ORAL_TABLET | Freq: Two times a day (BID) | ORAL | 11 refills | Status: DC
  Filled 2021-08-13: qty 60, 30d supply, fill #0
  Filled 2021-09-17: qty 60, 30d supply, fill #1
  Filled 2021-10-21: qty 60, 30d supply, fill #2
  Filled 2021-11-28: qty 60, 30d supply, fill #3
  Filled 2022-01-13: qty 60, 30d supply, fill #4

## 2021-08-16 ENCOUNTER — Other Ambulatory Visit (INDEPENDENT_AMBULATORY_CARE_PROVIDER_SITE_OTHER): Payer: Self-pay | Admitting: Nurse Practitioner

## 2021-08-16 ENCOUNTER — Other Ambulatory Visit (HOSPITAL_COMMUNITY): Payer: Self-pay

## 2021-08-16 DIAGNOSIS — E282 Polycystic ovarian syndrome: Secondary | ICD-10-CM

## 2021-08-18 ENCOUNTER — Ambulatory Visit (INDEPENDENT_AMBULATORY_CARE_PROVIDER_SITE_OTHER): Payer: No Typology Code available for payment source | Admitting: Internal Medicine

## 2021-08-18 ENCOUNTER — Other Ambulatory Visit (HOSPITAL_COMMUNITY): Payer: Self-pay

## 2021-08-18 MED ORDER — LAMOTRIGINE 25 MG PO TABS
ORAL_TABLET | ORAL | 0 refills | Status: DC
  Filled 2021-08-18: qty 180, 90d supply, fill #0

## 2021-08-19 ENCOUNTER — Other Ambulatory Visit (INDEPENDENT_AMBULATORY_CARE_PROVIDER_SITE_OTHER): Payer: Self-pay | Admitting: Nurse Practitioner

## 2021-08-19 ENCOUNTER — Other Ambulatory Visit (HOSPITAL_COMMUNITY): Payer: Self-pay

## 2021-08-19 DIAGNOSIS — E282 Polycystic ovarian syndrome: Secondary | ICD-10-CM

## 2021-08-19 MED ORDER — PROGESTERONE 200 MG PO CAPS
ORAL_CAPSULE | ORAL | 1 refills | Status: DC
  Filled 2021-08-19: qty 60, 30d supply, fill #0
  Filled 2021-09-17: qty 60, 30d supply, fill #1

## 2021-09-05 ENCOUNTER — Inpatient Hospital Stay: Payer: No Typology Code available for payment source

## 2021-09-05 ENCOUNTER — Other Ambulatory Visit: Payer: Self-pay

## 2021-09-05 ENCOUNTER — Inpatient Hospital Stay: Payer: No Typology Code available for payment source | Attending: Oncology | Admitting: Oncology

## 2021-09-05 ENCOUNTER — Encounter: Payer: Self-pay | Admitting: Oncology

## 2021-09-05 VITALS — BP 112/72 | HR 82 | Temp 97.9°F | Wt 222.0 lb

## 2021-09-05 DIAGNOSIS — I10 Essential (primary) hypertension: Secondary | ICD-10-CM | POA: Diagnosis not present

## 2021-09-05 DIAGNOSIS — Z87891 Personal history of nicotine dependence: Secondary | ICD-10-CM | POA: Insufficient documentation

## 2021-09-05 DIAGNOSIS — D72820 Lymphocytosis (symptomatic): Secondary | ICD-10-CM | POA: Insufficient documentation

## 2021-09-05 DIAGNOSIS — N920 Excessive and frequent menstruation with regular cycle: Secondary | ICD-10-CM | POA: Diagnosis not present

## 2021-09-05 DIAGNOSIS — Z803 Family history of malignant neoplasm of breast: Secondary | ICD-10-CM | POA: Insufficient documentation

## 2021-09-05 DIAGNOSIS — E1140 Type 2 diabetes mellitus with diabetic neuropathy, unspecified: Secondary | ICD-10-CM | POA: Diagnosis not present

## 2021-09-05 LAB — COMPREHENSIVE METABOLIC PANEL
ALT: 15 U/L (ref 0–44)
AST: 18 U/L (ref 15–41)
Albumin: 3.9 g/dL (ref 3.5–5.0)
Alkaline Phosphatase: 58 U/L (ref 38–126)
Anion gap: 8 (ref 5–15)
BUN: 13 mg/dL (ref 6–20)
CO2: 28 mmol/L (ref 22–32)
Calcium: 8.9 mg/dL (ref 8.9–10.3)
Chloride: 100 mmol/L (ref 98–111)
Creatinine, Ser: 0.82 mg/dL (ref 0.44–1.00)
GFR, Estimated: >60 mL/min (ref >60)
Glucose, Bld: 102 mg/dL — ABNORMAL HIGH (ref 70–99)
Potassium: 3.5 mmol/L (ref 3.5–5.1)
Sodium: 136 mmol/L (ref 135–145)
Total Bilirubin: 0.4 mg/dL (ref 0.3–1.2)
Total Protein: 7.2 g/dL (ref 6.5–8.1)

## 2021-09-05 LAB — CBC WITH DIFFERENTIAL/PLATELET
Abs Immature Granulocytes: 0.13 10*3/uL — ABNORMAL HIGH (ref 0.00–0.07)
Basophils Absolute: 0.1 10*3/uL (ref 0.0–0.1)
Basophils Relative: 1 %
Eosinophils Absolute: 0.5 10*3/uL (ref 0.0–0.5)
Eosinophils Relative: 3 %
HCT: 42.1 % (ref 36.0–46.0)
Hemoglobin: 13.8 g/dL (ref 12.0–15.0)
Immature Granulocytes: 1 %
Lymphocytes Relative: 39 %
Lymphs Abs: 5.2 10*3/uL — ABNORMAL HIGH (ref 0.7–4.0)
MCH: 29.5 pg (ref 26.0–34.0)
MCHC: 32.8 g/dL (ref 30.0–36.0)
MCV: 90 fL (ref 80.0–100.0)
Monocytes Absolute: 1 10*3/uL (ref 0.1–1.0)
Monocytes Relative: 7 %
Neutro Abs: 6.7 10*3/uL (ref 1.7–7.7)
Neutrophils Relative %: 49 %
Platelets: 341 10*3/uL (ref 150–400)
RBC: 4.68 MIL/uL (ref 3.87–5.11)
RDW: 14.1 % (ref 11.5–15.5)
WBC: 13.5 10*3/uL — ABNORMAL HIGH (ref 4.0–10.5)
nRBC: 0 % (ref 0.0–0.2)

## 2021-09-05 LAB — HEPATITIS PANEL, ACUTE
HCV Ab: NONREACTIVE
Hep A IgM: NONREACTIVE
Hep B C IgM: NONREACTIVE
Hepatitis B Surface Ag: NONREACTIVE

## 2021-09-05 LAB — TECHNOLOGIST SMEAR REVIEW
Plt Morphology: NORMAL
RBC MORPHOLOGY: NORMAL
WBC MORPHOLOGY: NORMAL

## 2021-09-05 LAB — LACTATE DEHYDROGENASE: LDH: 100 U/L (ref 98–192)

## 2021-09-06 LAB — VON WILLEBRAND PANEL
Coagulation Factor VIII: 85 % (ref 56–140)
Ristocetin Co-factor, Plasma: 46 % — ABNORMAL LOW (ref 50–200)
Von Willebrand Antigen, Plasma: 79 % (ref 50–200)

## 2021-09-06 LAB — COAG STUDIES INTERP REPORT

## 2021-09-06 NOTE — Progress Notes (Signed)
Hematology/Oncology Consult note [phone] [phone]   Patient Care Team: Adin Hector, MD as PCP - General (Internal Medicine) Herminio Commons, MD (Inactive) as PCP - Cardiology (Cardiology) Redmond School, MD as Consulting Physician (Internal Medicine)  REFERRING PROVIDER: Adin Hector, MD  CHIEF COMPLAINTS/REASON FOR VISIT:  Evaluation of leukocytosis  HISTORY OF PRESENTING ILLNESS:  Heather Burnett is a  50 y.o.  female with PMH listed below who was referred to me for evaluation of leukocytosis  I reviewed patient's previous medical records. Patient has chronic leukocytosis, predominantly lymphocytosis.\ No aggravating or elevated factors. Associated symptoms or signs:  Denies unintentional weight loss, fever, chills,night sweats.  Fatigue Smoking history: Denies Patient denies any history of recent oral steroid use or steroid injection  Patient denies frequent sinopulmonary infection. Patient has heavy menstrual bleeding.  Von Willebrand disease was listed on her medical history.  She denies any bleeding complications after previous surgeries.  + Joint pain,    Review of Systems  Constitutional:  Negative for appetite change, chills, fatigue and fever.  HENT:   Negative for hearing loss and voice change.   Eyes:  Negative for eye problems.  Respiratory:  Negative for chest tightness and cough.   Cardiovascular:  Negative for chest pain.  Gastrointestinal:  Negative for abdominal distention, abdominal pain and blood in stool.  Endocrine: Negative for hot flashes.  Genitourinary:  Positive for vaginal bleeding. Negative for difficulty urinating and frequency.   Musculoskeletal:  Positive for arthralgias.  Skin:  Negative for itching and rash.  Neurological:  Negative for extremity weakness.  Hematological:  Negative for adenopathy.  Psychiatric/Behavioral:  Negative for confusion.     MEDICAL HISTORY:  Past Medical History:   Diagnosis Date   Allergy    Anxiety    Asthma    WELL CONTROLLED   Cancer ([place])    melanoma on my chest   COPD (chronic obstructive pulmonary disease) ([place])    not sure about COPD    DDD (degenerative disc disease), cervical    and lower back   Depression    Diabetes mellitus, type II (HCC)    DJD (degenerative joint disease)    Dysrhythmia    Empty sella syndrome ([place])    follow[facility] at [facility]   Family history of adverse reaction to anesthesia    mother almost died with anesthesia in the past, hard to wake up and got real sick on my stomach vomiting, now she receives local anethesia.   GERD (gastroesophageal reflux disease)    Headache    history of migraine   Heart murmur    History of methicillin resistant staphylococcus aureus (MRSA)    History of palpitations 07/2004   Hyperlipidemia    Hypertension    Hypothyroidism    Hypothyroidism, adult 02/08/2020   Lymphocytosis    Neuropathy 2016   Osteoporosis    PCOS (polycystic ovarian syndrome)    Pituitary microadenoma ([place])    followed at [facility]   Pneumonia 2008   Pre-diabetes    Sleep apnea    USES CPAP   Thyroid disease    Von Willebrand disease 2012   PT STATES SHE HAS NEVER HAD ANY ISSUES WITH BLEEDING    SURGICAL HISTORY: Past Surgical History:  Procedure Laterality Date   ACHILLES TENDON SURGERY Left 12/15/2019   Procedure: ACHILLES TENDON REPAIR PRIMARY;  Surgeon: Samara Deist, DPM;  Location: [facility] ORS;  Service: Podiatry;  Laterality: Left;   ANTERIOR CERVICAL DECOMP/DISCECTOMY FUSION  N/A 10/02/2020   Procedure: ANTERIOR CERVICAL DECOMPRESSION/DISCECTOMY FUSION 1 LEVEL C6-7;  Surgeon: Meade Maw, MD;  Location: [facility] ORS;  Service: Neurosurgery;  Laterality: N/A;   BACK SURGERY     removal of 2 lumbar vertebrae   BREAST BIOPSY Right 2010   NEG   CHOLECYSTECTOMY     COLONOSCOPY  03/03/2013, 02/06/2008   COLONOSCOPY WITH PROPOFOL N/A 08/05/2018   Procedure: COLONOSCOPY WITH PROPOFOL;  Surgeon: Manya Silvas, MD;  Location: [facility] [place] ENDOSCOPY;  Service: Endoscopy;  Laterality: N/A;   EGD  02/06/2008   GASTROC RECESSION EXTREMITY Left 12/15/2019   Procedure: GASTROC RECESSION EXTREMITY;  Surgeon: Samara Deist, DPM;  Location: [facility] ORS;  Service: Podiatry;  Laterality: Left;   HALLUX VALGUS CHEILECTOMY Left 08/21/2016   Procedure: HALLUX VALGUS CHEILECTOMY;  Surgeon: Sharlotte Alamo, DPM;  Location: [facility] ORS;  Service: Podiatry;  Laterality: Left;   HEMORRHOID SURGERY  10/2014   SPINE SURGERY     TONSILLECTOMY AND ADENOIDECTOMY      SOCIAL HISTORY: Social History   Socioeconomic History   Marital status: Divorced    Spouse name: Not on file   Number of children: Not on file   Years of education: Not on file   Highest education level: Not on file  Occupational History   Not on file  Tobacco Use   Smoking status: Former    Years: 8.00    Types: Cigarettes    Quit date: 04/09/1994    Years since quitting: 27.4   Smokeless tobacco: Never  Vaping Use   Vaping Use: Never used  Substance and Sexual Activity   Alcohol use: Yes    Comment: social   Drug use: No   Sexual activity: Not on file  Other Topics Concern   Not on file  Social History Narrative   Divorced since 2009.Lives with 48 year old mum.CMA at [employer].   Social Determinants of Health   Financial Resource Strain: Not on file  Food Insecurity: Not on file  Transportation Needs: Not on file  Physical Activity: Not on file  Stress: Not on file  Social Connections: Not on file  Intimate Partner Violence: Not on file    FAMILY HISTORY: Family History  Problem Relation Age of Onset   Thyroid disease Mother    Osteoporosis Mother    Diabetes Father    Hyperlipidemia Father    Hypertension Father    Cancer Father    Heart attack Father    Breast cancer Sister 78   Breast cancer Maternal Aunt 24   Breast cancer Maternal Grandmother 61    ALLERGIES:  is allergic to liraglutide -weight management,  polysorbate, sorbitan, adhesive [tape], latex, and penicillins.  MEDICATIONS:  Current Outpatient Medications  Medication Sig Dispense Refill   acetaminophen (TYLENOL) 500 MG tablet Take 500 mg by mouth every 6 (six) hours as needed for mild pain or headache.      albuterol (VENTOLIN HFA) 108 (90 Base) MCG/ACT inhaler Inhale 2 inhalations into the lungs every 6 (six) hours as needed for Wheezing 18 g 11   atorvastatin (LIPITOR) 40 MG tablet Take 1 tablet (40 mg total) by mouth once daily 30 tablet 11   celecoxib (CELEBREX) 100 MG capsule Take 1 capsule (100 mg total) by mouth 2 (two) times daily. 60 capsule 0   cetirizine (ZYRTEC) 10 MG tablet Take 10 mg by mouth daily as needed (seasonal allergies.).      Cholecalciferol (VITAMIN D-3) 125 MCG (5000 UT) TABS Take  5,000 Units by mouth daily.     citalopram (CELEXA) 20 MG tablet Take 1 tablet (20 mg total) by mouth daily. 90 tablet 0   clonazePAM (KLONOPIN) 0.5 MG tablet Take 0.5 mg by mouth daily as needed for anxiety.     doxycycline (VIBRAMYCIN) 100 MG capsule Take 100 mg by mouth 2 (two) times daily as needed (acne flare ups).      DULoxetine (CYMBALTA) 60 MG capsule Take 1 capsule by mouth daily at 8 am. 90 capsule 1   fluticasone (FLOVENT HFA) 110 MCG/ACT inhaler Inhale 1 puff by mouth 2 (two) times daily (Patient taking differently: Inhale into the lungs as needed.) 12 g 11   Fluticasone-Salmeterol (ADVAIR) 250-50 MCG/DOSE AEPB Inhale 1 puff into the lungs every 12 (twelve) hours as needed (seasonal allergies).      furosemide (LASIX) 40 MG tablet Take 40 mg by mouth daily.      glucose blood (FREESTYLE LITE) test strip use to test blood glucose once daily as directed 100 each 0   hydrOXYzine (VISTARIL) 25 MG capsule Take 1 capsule (25 mg total) by mouth 2 (two) times daily as needed. 180 capsule 0   lamoTRIgine (LAMICTAL) 200 MG tablet Take 1 tablet by mouth once daily. 90 tablet 1   lamoTRIgine (LAMICTAL) 25 MG tablet Take 2 tablets by  mouth nightly 60 tablet 0   lamoTRIgine (LAMICTAL) 25 MG tablet Take 2 tablets by mouth nightly. 180 tablet 0   metoprolol succinate (TOPROL XL) 50 MG 24 hr tablet Take 1 tablet (50 mg total) by mouth 2 (two) times daily. Take with or immediately following a meal. (Patient taking differently: Take 25-50 mg by mouth See admin instructions. Take 50 mg in morning and 25 mg in the afternoon as needed) 180 tablet 3   metoprolol tartrate (LOPRESSOR) 50 MG tablet Take 1 tablet (50 mg total) by mouth 2 (two) times daily 60 tablet 11   progesterone (PROMETRIUM) 200 MG capsule TAKE 2 CAPSULES BY MOUTH EVERY NIGHT 60 capsule 1   Semaglutide 7 MG TABS TAKE 1 TABLET BY MOUTH ONCE A DAY 90 tablet 1   spironolactone (ALDACTONE) 100 MG tablet Take 1 tablet (100 mg total) by mouth daily. 90 tablet 0   spironolactone (ALDACTONE) 25 MG tablet Take 1 tablet (25 mg total) by mouth once daily 30 tablet 11   SYNTHROID 88 MCG tablet Take 1 tablet by mouth daily before breakfast. 30 tablet 2   tiZANidine (ZANAFLEX) 4 MG tablet Take 1 tablet (4 mg total) by mouth 3 (three) times daily as needed 20 tablet 1   No current facility-administered medications for this visit.     PHYSICAL EXAMINATION: ECOG PERFORMANCE STATUS: 0 - Asymptomatic Vitals:   09/05/21 1228  BP: 112/72  Pulse: 82  Temp: 97.9 F (36.6 C)   Filed Weights   09/05/21 1228  Weight: 222 lb (100.7 kg)    Physical Exam Constitutional:      General: She is not in acute distress.    Appearance: She is obese.  HENT:     Head: Normocephalic and atraumatic.  Eyes:     General: No scleral icterus. Cardiovascular:     Rate and Rhythm: Normal rate and regular rhythm.     Heart sounds: Normal heart sounds.  Pulmonary:     Effort: Pulmonary effort is normal. No respiratory distress.     Breath sounds: No wheezing.  Abdominal:     General: Bowel sounds are normal. There is no  distension.     Palpations: Abdomen is soft.  Musculoskeletal:         General: No deformity. Normal range of motion.     Cervical back: Normal range of motion and neck supple.  Skin:    General: Skin is warm and dry.     Findings: No erythema or rash.  Neurological:     Mental Status: She is alert and oriented to person, place, and time. Mental status is at baseline.     Cranial Nerves: No cranial nerve deficit.     Coordination: Coordination normal.  Psychiatric:        Mood and Affect: Mood normal.    CMP Latest Ref Rng & Units 09/05/2021  Glucose 70 - 99 mg/dL 102(H)  BUN 6 - 20 mg/dL 13  Creatinine 0.44 - 1.00 mg/dL 0.82  Sodium 135 - 145 mmol/L 136  Potassium 3.5 - 5.1 mmol/L 3.5  Chloride 98 - 111 mmol/L 100  CO2 22 - 32 mmol/L 28  Calcium 8.9 - 10.3 mg/dL 8.9  Total Protein 6.5 - 8.1 g/dL 7.2  Total Bilirubin 0.3 - 1.2 mg/dL 0.4  Alkaline Phos 38 - 126 U/L 58  AST 15 - 41 U/L 18  ALT 0 - 44 U/L 15   CBC Latest Ref Rng & Units 09/05/2021  WBC 4.0 - 10.5 K/uL 13.5(H)  Hemoglobin 12.0 - 15.0 g/dL 13.8  Hematocrit 36.0 - 46.0 % 42.1  Platelets 150 - 400 K/uL 341     RADIOGRAPHIC STUDIES: I have personally reviewed the radiological images as listed and agreed with the findings in the report. No results found.  LABORATORY DATA:  I have reviewed the data as listed Lab Results  Component Value Date   WBC 13.5 (H) 09/05/2021   HGB 13.8 09/05/2021   HCT 42.1 09/05/2021   MCV 90.0 09/05/2021   PLT 341 09/05/2021   Recent Labs    09/23/20 1213 04/08/21 1659 09/05/21 1318  NA 137 138 136  K 3.5 4.3 3.5  CL 101 100 100  CO2 _0 GLUCOSE 99 84 102*  BUN _1 CREATININE 0.56 0.79 0.82  CALCIUM 8.6* 10.1 8.9  GFRNONAA >60 89 >60  GFRAA  --  103  --   PROT  --  6.7 7.2  ALBUMIN  --   --  3.9  AST  --  17 18  ALT  --  16 15  ALKPHOS  --   --  58  BILITOT  --  0.6 0.4   Iron/TIBC/Ferritin/ %Sat No results found for: IRON, TIBC, FERRITIN, IRONPCTSAT      ASSESSMENT & PLAN:  1. Lymphocytosis   2. Menorrhagia  with regular cycle    Labs reviewed and discussed with patient that Leukocytosis, predominantly lymphocytosis can be secondary to infection, chronic inflammation, smoking, autoimmune disease, or underlying bone marrow disorders.   For the work up of patient's leukocytosis, I recommend checking CBC;CMP, LDH, pathology smear review, flowcytometry, ANA, hepatitis panel, HIV, multiple myeloma panel.  Etc.  Reported history Willebrand disease, menorrhagia.  I will repeat a von Willebrand panel.  Orders Placed This Encounter  Procedures   Comprehensive metabolic panel    Standing Status:   Future    Number of Occurrences:   1    Standing Expiration Date:   09/05/2022   CBC with Differential/Platelet    Standing Status:   Future    Number of Occurrences:   1    Standing  Expiration Date:   09/05/2022   Flow cytometry panel-leukemia/lymphoma work-up    Standing Status:   Future    Number of Occurrences:   1    Standing Expiration Date:   09/05/2022   Kappa/lambda light chains    Standing Status:   Future    Number of Occurrences:   1    Standing Expiration Date:   09/05/2022   Multiple Myeloma Panel (SPEP&IFE w/QIG)    Standing Status:   Future    Number of Occurrences:   1    Standing Expiration Date:   09/05/2022   Lactate dehydrogenase    Standing Status:   Future    Number of Occurrences:   1    Standing Expiration Date:   09/05/2022   Technologist smear review    Standing Status:   Future    Number of Occurrences:   1    Standing Expiration Date:   09/05/2022   ANA, IFA (with reflex)    Standing Status:   Future    Number of Occurrences:   1    Standing Expiration Date:   09/05/2022   Hepatitis panel, acute    Standing Status:   Future    Number of Occurrences:   1    Standing Expiration Date:   09/05/2022   HIV ANTIBODY (ROUTINE TETSING W RELFEX)    Standing Status:   Future    Number of Occurrences:   1    Standing Expiration Date:   09/05/2022   Von Willebrand panel     Standing Status:   Future    Number of Occurrences:   1    Standing Expiration Date:   09/05/2022    All questions were answered. The patient knows to call the clinic with any problems questions or concerns.  Return of visit: 3-4 weeks to discuss labs. Thank you for this kind referral and the opportunity to participate in the care of this patient. A copy of today's note is routed to referring provider   Earlie Server, MD, PhD 09/06/2021

## 2021-09-08 LAB — COMP PANEL: LEUKEMIA/LYMPHOMA

## 2021-09-08 LAB — KAPPA/LAMBDA LIGHT CHAINS
Kappa free light chain: 14.4 mg/L (ref 3.3–19.4)
Kappa, lambda light chain ratio: 1.43 (ref 0.26–1.65)
Lambda free light chains: 10.1 mg/L (ref 5.7–26.3)

## 2021-09-08 LAB — HIV ANTIBODY (ROUTINE TESTING W REFLEX): HIV Screen 4th Generation wRfx: NONREACTIVE

## 2021-09-09 LAB — ANTINUCLEAR ANTIBODIES, IFA: ANA Ab, IFA: NEGATIVE

## 2021-09-11 LAB — MULTIPLE MYELOMA PANEL, SERUM
Albumin SerPl Elph-Mcnc: 3.5 g/dL (ref 2.9–4.4)
Albumin/Glob SerPl: 1.3 (ref 0.7–1.7)
Alpha 1: 0.2 g/dL (ref 0.0–0.4)
Alpha2 Glob SerPl Elph-Mcnc: 0.8 g/dL (ref 0.4–1.0)
B-Globulin SerPl Elph-Mcnc: 1 g/dL (ref 0.7–1.3)
Gamma Glob SerPl Elph-Mcnc: 0.8 g/dL (ref 0.4–1.8)
Globulin, Total: 2.8 g/dL (ref 2.2–3.9)
IgA: 160 mg/dL (ref 87–352)
IgG (Immunoglobin G), Serum: 723 mg/dL (ref 586–1602)
IgM (Immunoglobulin M), Srm: 139 mg/dL (ref 26–217)
Total Protein ELP: 6.3 g/dL (ref 6.0–8.5)

## 2021-09-17 ENCOUNTER — Other Ambulatory Visit (HOSPITAL_COMMUNITY): Payer: Self-pay

## 2021-09-19 ENCOUNTER — Encounter: Payer: Self-pay | Admitting: Oncology

## 2021-09-19 NOTE — Telephone Encounter (Signed)
Please advise 

## 2021-09-23 ENCOUNTER — Telehealth: Payer: Self-pay | Admitting: Oncology

## 2021-09-23 NOTE — Telephone Encounter (Signed)
Pt called to cancel her appt for 12-7. Too many people at work with the flu. She will call back and reschedule at a later date.

## 2021-09-24 ENCOUNTER — Inpatient Hospital Stay: Payer: No Typology Code available for payment source | Admitting: Oncology

## 2021-10-21 ENCOUNTER — Other Ambulatory Visit (INDEPENDENT_AMBULATORY_CARE_PROVIDER_SITE_OTHER): Payer: Self-pay

## 2021-10-21 ENCOUNTER — Other Ambulatory Visit (HOSPITAL_COMMUNITY): Payer: Self-pay

## 2021-10-21 ENCOUNTER — Encounter: Payer: Self-pay | Admitting: "Endocrinology

## 2021-10-21 MED ORDER — LAMOTRIGINE 25 MG PO TABS
ORAL_TABLET | ORAL | 0 refills | Status: DC
  Filled 2021-10-21: qty 60, 30d supply, fill #0

## 2021-10-21 MED ORDER — LAMOTRIGINE 200 MG PO TABS
ORAL_TABLET | Freq: Every day | ORAL | 0 refills | Status: DC
  Filled 2021-10-21: qty 30, 30d supply, fill #0

## 2021-10-21 MED ORDER — HYDROXYZINE PAMOATE 25 MG PO CAPS
ORAL_CAPSULE | ORAL | 0 refills | Status: DC
  Filled 2021-10-21: qty 60, 30d supply, fill #0

## 2021-10-21 MED ORDER — DULOXETINE HCL 60 MG PO CPEP
ORAL_CAPSULE | ORAL | 0 refills | Status: DC
  Filled 2021-10-21: qty 30, 30d supply, fill #0

## 2021-10-22 ENCOUNTER — Other Ambulatory Visit (HOSPITAL_COMMUNITY): Payer: Self-pay

## 2021-10-22 ENCOUNTER — Other Ambulatory Visit: Payer: Self-pay

## 2021-10-22 ENCOUNTER — Other Ambulatory Visit
Admission: RE | Admit: 2021-10-22 | Discharge: 2021-10-22 | Disposition: A | Discharge status: Home / Self Care | Payer: No Typology Code available for payment source | Attending: "Endocrinology | Admitting: "Endocrinology

## 2021-10-22 DIAGNOSIS — E039 Hypothyroidism, unspecified: Secondary | ICD-10-CM | POA: Insufficient documentation

## 2021-10-22 LAB — T4, FREE: Free T4: 0.83 ng/dL (ref 0.61–1.12)

## 2021-10-22 LAB — TSH: TSH: 0.741 u[IU]/mL (ref 0.350–4.500)

## 2021-10-23 LAB — THYROID PEROXIDASE ANTIBODY: Thyroperoxidase Ab SerPl-aCnc: <9 IU/mL (ref 0–34)

## 2021-10-23 LAB — THYROGLOBULIN ANTIBODY: Thyroglobulin Antibody: <1 IU/mL (ref 0.0–0.9)

## 2021-10-24 ENCOUNTER — Other Ambulatory Visit: Payer: Self-pay

## 2021-10-24 ENCOUNTER — Ambulatory Visit (HOSPITAL_COMMUNITY)
Admission: RE | Admit: 2021-10-24 | Discharge: 2021-10-24 | Disposition: A | Discharge status: Home / Self Care | Payer: No Typology Code available for payment source | Source: Ambulatory Visit | Attending: "Endocrinology | Admitting: "Endocrinology

## 2021-10-24 DIAGNOSIS — E039 Hypothyroidism, unspecified: Secondary | ICD-10-CM | POA: Insufficient documentation

## 2021-10-27 ENCOUNTER — Ambulatory Visit (INDEPENDENT_AMBULATORY_CARE_PROVIDER_SITE_OTHER): Payer: No Typology Code available for payment source | Admitting: "Endocrinology

## 2021-10-27 ENCOUNTER — Other Ambulatory Visit: Payer: Self-pay

## 2021-10-27 ENCOUNTER — Encounter: Payer: Self-pay | Admitting: "Endocrinology

## 2021-10-27 ENCOUNTER — Other Ambulatory Visit (HOSPITAL_COMMUNITY): Payer: Self-pay

## 2021-10-27 VITALS — BP 112/69 | HR 69 | Ht 62.0 in | Wt 235.8 lb

## 2021-10-27 DIAGNOSIS — Z6841 Body Mass Index (BMI) 40.0 and over, adult: Secondary | ICD-10-CM | POA: Diagnosis not present

## 2021-10-27 DIAGNOSIS — E039 Hypothyroidism, unspecified: Secondary | ICD-10-CM

## 2021-10-27 DIAGNOSIS — E119 Type 2 diabetes mellitus without complications: Secondary | ICD-10-CM | POA: Diagnosis not present

## 2021-10-27 LAB — POCT GLYCOSYLATED HEMOGLOBIN (HGB A1C): HbA1c, POC (controlled diabetic range): 6.8 % (ref 0.0–7.0)

## 2021-10-27 MED ORDER — RYBELSUS 14 MG PO TABS
14.0000 mg | ORAL_TABLET | Freq: Every day | ORAL | 1 refills | Status: DC
  Filled 2021-10-27: qty 90, 90d supply, fill #0
  Filled 2022-02-05: qty 90, 90d supply, fill #1

## 2021-10-27 NOTE — Progress Notes (Signed)
10/27/2021, 4:01 PM   Endocrinology follow-up note  Subjective:    Patient ID: Heather Burnett, female    DOB: 08-23-1971, PCP Adin Hector, MD   Past Medical History:  Diagnosis Date   Allergy    Anxiety    Asthma    WELL CONTROLLED   Cancer ([place])    melanoma on my chest   COPD (chronic obstructive pulmonary disease) ([place])    not sure about COPD    DDD (degenerative disc disease), cervical    and lower back   Depression    Diabetes mellitus, type II ([place])    DJD (degenerative joint disease)    Dysrhythmia    Empty sella syndrome ([place])    followed at [facility] history of adverse reaction to anesthesia    mother almost died with anesthesia in the past, hard to wake up and got real sick on my stomach vomiting, now she receives local anethesia.   GERD (gastroesophageal reflux disease)    Headache    history of migraine   Heart murmur    History of methicillin resistant staphylococcus aureus (MRSA)    History of palpitations 07/2004   Hyperlipidemia    Hypertension    Hypothyroidism    Hypothyroidism, adult 02/08/2020   Lymphocytosis    Neuropathy 2016   Osteoporosis    PCOS (polycystic ovarian syndrome)    Pituitary microadenoma ([place])    followed at [facility]   Pneumonia 2008   Pre-diabetes    Sleep apnea    USES CPAP   Thyroid disease    Von Willebrand disease 2012   PT STATES SHE HAS NEVER HAD ANY ISSUES WITH BLEEDING   Past Surgical History:  Procedure Laterality Date   ACHILLES TENDON SURGERY Left 12/15/2019   Procedure: ACHILLES TENDON REPAIR PRIMARY;  Surgeon: Samara Deist, DPM;  Location: [facility] ORS;  Service: Podiatry;  Laterality: Left;   ANTERIOR CERVICAL DECOMP/DISCECTOMY FUSION N/A 10/02/2020   Procedure: ANTERIOR CERVICAL DECOMPRESSION/DISCECTOMY FUSION 1 LEVEL C6-7;  Surgeon: Meade Maw, MD;  Location: [facility] ORS;  Service: Neurosurgery;  Laterality: N/A;   BACK SURGERY     removal of  2 lumbar vertebrae   BREAST BIOPSY Right 2010   NEG   CHOLECYSTECTOMY     COLONOSCOPY  03/03/2013, 02/06/2008   COLONOSCOPY WITH PROPOFOL N/A 08/05/2018   Procedure: COLONOSCOPY WITH PROPOFOL;  Surgeon: Manya Silvas, MD;  Location: [facility] ENDOSCOPY;  Service: Endoscopy;  Laterality: N/A;   EGD  02/06/2008   GASTROC RECESSION EXTREMITY Left 12/15/2019   Procedure: GASTROC RECESSION EXTREMITY;  Surgeon: Samara Deist, DPM;  Location: [facility] ORS;  Service: Podiatry;  Laterality: Left;   HALLUX VALGUS CHEILECTOMY Left 08/21/2016   Procedure: HALLUX VALGUS CHEILECTOMY;  Surgeon: Sharlotte Alamo, DPM;  Location: [facility] ORS;  Service: Podiatry;  Laterality: Left;   HEMORRHOID SURGERY  10/2014   SPINE SURGERY     TONSILLECTOMY AND ADENOIDECTOMY     Social History   Socioeconomic History   Marital status: Divorced    Spouse name: Not on file   Number of children: Not on file   Years of education: Not on file   Highest education level: Not on file  Occupational  History   Not on file  Tobacco Use   Smoking status: Former    Years: 8.00    Types: Cigarettes    Quit date: 04/09/1994    Years since quitting: 27.5   Smokeless tobacco: Never  Vaping Use   Vaping Use: Never used  Substance and Sexual Activity   Alcohol use: Yes    Comment: social   Drug use: No   Sexual activity: Not on file  Other Topics Concern   Not on file  Social History Narrative   Divorced since 2009.Lives with 49 year old mum.CMA at [employer].   Social Determinants of Health   Financial Resource Strain: Not on file  Food Insecurity: Not on file  Transportation Needs: Not on file  Physical Activity: Not on file  Stress: Not on file  Social Connections: Not on file   Family History  Problem Relation Age of Onset   Thyroid disease Mother    Osteoporosis Mother    Diabetes Father    Hyperlipidemia Father    Hypertension Father    Cancer Father    Heart attack Father    Breast cancer Sister 77   Breast  cancer Maternal Aunt 39   Breast cancer Maternal Grandmother 24   Outpatient Encounter Medications as of 10/27/2021  Medication Sig   acetaminophen (TYLENOL) 500 MG tablet Take 500 mg by mouth every 6 (six) hours as needed for mild pain or headache.    albuterol (VENTOLIN HFA) 108 (90 Base) MCG/ACT inhaler Inhale 2 inhalations into the lungs every 6 (six) hours as needed for Wheezing   atorvastatin (LIPITOR) 40 MG tablet Take 1 tablet (40 mg total) by mouth once daily   celecoxib (CELEBREX) 100 MG capsule Take 1 capsule (100 mg total) by mouth 2 (two) times daily.   cetirizine (ZYRTEC) 10 MG tablet Take 10 mg by mouth daily as needed (seasonal allergies.).    Cholecalciferol (VITAMIN D-3) 125 MCG (5000 UT) TABS Take 5,000 Units by mouth daily.   citalopram (CELEXA) 20 MG tablet Take 1 tablet (20 mg total) by mouth daily.   clonazePAM (KLONOPIN) 0.5 MG tablet Take 0.5 mg by mouth daily as needed for anxiety.   doxycycline (VIBRAMYCIN) 100 MG capsule Take 100 mg by mouth 2 (two) times daily as needed (acne flare ups).    DULoxetine (CYMBALTA) 60 MG capsule Take 1 capsule by mouth daily at  8:00am   fluticasone (FLOVENT HFA) 110 MCG/ACT inhaler Inhale 1 puff by mouth 2 (two) times daily (Patient taking differently: Inhale into the lungs as needed.)   Fluticasone-Salmeterol (ADVAIR) 250-50 MCG/DOSE AEPB Inhale 1 puff into the lungs every 12 (twelve) hours as needed (seasonal allergies).    furosemide (LASIX) 40 MG tablet Take 40 mg by mouth daily.    glucose blood (FREESTYLE LITE) test strip use to test blood glucose once daily as directed   hydrOXYzine (VISTARIL) 25 MG capsule Take 1 capsule by mouth twice daily as needed   lamoTRIgine (LAMICTAL) 200 MG tablet Take 1 tablet by mouth daily   lamoTRIgine (LAMICTAL) 25 MG tablet Take 2 tablets by mouth nightly   metoprolol tartrate (LOPRESSOR) 50 MG tablet Take 1 tablet (50 mg total) by mouth 2 (two) times daily   progesterone (PROMETRIUM) 200 MG  capsule TAKE 2 CAPSULES BY MOUTH EVERY NIGHT   Semaglutide (RYBELSUS) 14 MG TABS Take 14 mg by mouth daily.   spironolactone (ALDACTONE) 25 MG tablet Take 1 tablet (25 mg total) by mouth once  daily   SYNTHROID 88 MCG tablet Take 1 tablet by mouth daily before breakfast.   tiZANidine (ZANAFLEX) 4 MG tablet Take 1 tablet (4 mg total) by mouth 3 (three) times daily as needed   triamcinolone cream (KENALOG) 0.1 % Apply topically 2 (two) times daily as needed.   [DISCONTINUED] Semaglutide 7 MG TABS TAKE 1 TABLET BY MOUTH ONCE A DAY   DULoxetine (CYMBALTA) 60 MG capsule Take 1 capsule by mouth daily at 8 am. (Patient not taking: Reported on 10/27/2021)   hydrOXYzine (VISTARIL) 25 MG capsule Take 1 capsule (25 mg total) by mouth 2 (two) times daily as needed. (Patient not taking: Reported on 10/27/2021)   lamoTRIgine (LAMICTAL) 25 MG tablet Take 2 tablets by mouth nightly (Patient not taking: Reported on 10/27/2021)   metoprolol succinate (TOPROL XL) 50 MG 24 hr tablet Take 1 tablet (50 mg total) by mouth 2 (two) times daily. Take with or immediately following a meal. (Patient not taking: Reported on 10/27/2021)   spironolactone (ALDACTONE) 100 MG tablet Take 1 tablet (100 mg total) by mouth daily. (Patient not taking: Reported on 10/27/2021)   [DISCONTINUED] progesterone (PROMETRIUM) 200 MG capsule TAKE 2 CAPSULES BY MOUTH EVERY NIGHT (Patient taking differently: Take 200 mg by mouth at bedtime.)   No facility-administered encounter medications on file as of 10/27/2021.   ALLERGIES: Allergies  Allergen Reactions   Liraglutide -Weight Management Other (See Comments)    GI upset   Polysorbate    Sorbitan Other (See Comments)    GI upset   Adhesive [Tape] Itching and Rash    Paper tape or coban is easier on the skin.   Latex Rash   Penicillins Rash    Updated on 09/26/2020 following APP conversation with patient.  Swelling of the face/tongue/throat, SOB, or low BP? NO Sudden or severe rash/hives, skin  peeling, or any reaction on the inside of your mouth or nose? NO - low severity rash to body.  Seek medical attention at a hospital or doctor's office? Yes When did it last happen? Age 59 following "a shot of PCN".       If all above answers are "NO", may proceed with cephalosporin use. - Has taken cefdinir and cephalexin without issues.      VACCINATION STATUS: Immunization History  Administered Date(s) Administered   Influenza Split 08/02/2015, 07/19/2018   Influenza,inj,Quad PF,6+ Mos 07/22/2017   Influenza-Unspecified 06/19/2020   Tdap 11/12/2010, 04/08/2021    HPI NAIMA VELDHUIZEN is 51 y.o. female who presents today for follow-up after she was seen in consultation for hypothyroidism, type 2 diabetes.   See notes from previous visit.  She was diagnosed with hypothyroidism at approximate age of 31 years.  She was treated with various dose of thyroid hormone formulations.  Due to problem stabilizing her thyroid function tests, her NP thyroid was switched to levothyroxine during her last visit.  She is currently on levothyroxine 88 mcg p.o. daily before breakfast.  She reports good consistency.  Her previsit labs are consistent with appropriate replacement.  She has no new complaints.  Her previous symptoms of iatrogenic thyrotoxicosis have largely resolved.  She denies any exposure to thyroid iodine ablation nor thyroidectomy.   she denies dysphagia, shortness of breath nor voice change.  She has family history of various types of thyroid dysfunction, however no thyroid malignancy.  She did not have any thyroid imaging.  She has well-controlled type 2 diabetes with point-of-care A1c of 6.8% increasing from 6.1% during her last  visit.  She is currently on Rybelsus 7 mg p.o. daily, with no side effects.      Review of Systems  Constitutional:  + Fluctuating body weight, + fatigue, + subjective hyperthermia, no subjective hypothermia   Objective:    Vitals with BMI 10/27/2021 09/05/2021  07/14/2021  Height 5\' 2"  - 5\' 2"   Weight 235 lbs 13 oz 222 lbs 229 lbs 13 oz  BMI 69.62 - 95.28  Systolic 413 244 010  Diastolic 69 72 82  Pulse 69 82 72  Some encounter information is confidential and restricted. Go to Review Flowsheets activity to see all data.    BP 112/69    Pulse 69    Ht 5\' 2"  (1.575 m)    Wt 235 lb 12.8 oz (107 kg)    SpO2 99%    BMI 43.13 kg/m   Wt Readings from Last 3 Encounters:  10/27/21 235 lb 12.8 oz (107 kg)  09/05/21 222 lb (100.7 kg)  07/14/21 229 lb 12.8 oz (104.2 kg)    Physical Exam  Constitutional:  Body mass index is 43.13 kg/m.,  not in acute distress, normal state of mind Eyes: PERRLA, EOMI, no exophthalmos ENT: moist mucous membranes, no gross thyromegaly, no gross cervical lymphadenopathy   CMP ( most recent) CMP     Component Value Date/Time   NA 136 09/05/2021 1318   K 3.5 09/05/2021 1318   CL 100 09/05/2021 1318   CO2 28 09/05/2021 1318   GLUCOSE 102 (H) 09/05/2021 1318   BUN 13 09/05/2021 1318   CREATININE 0.82 09/05/2021 1318   CREATININE 0.79 04/08/2021 1659   CALCIUM 8.9 09/05/2021 1318   PROT 7.2 09/05/2021 1318   ALBUMIN 3.9 09/05/2021 1318   AST 18 09/05/2021 1318   ALT 15 09/05/2021 1318   ALKPHOS 58 09/05/2021 1318   BILITOT 0.4 09/05/2021 1318   GFRNONAA >60 09/05/2021 1318   GFRNONAA 89 04/08/2021 1659   GFRAA 103 04/08/2021 1659     Diabetic Labs (most recent): Lab Results  Component Value Date   HGBA1C 6.8 10/27/2021   HGBA1C 6.1 (H) 04/08/2021   HGBA1C 6.1 (H) 06/17/2020     Lipid Panel ( most recent) Lipid Panel     Component Value Date/Time   CHOL 169 04/08/2021 1659   TRIG 199 (H) 04/08/2021 1659   HDL 44 (L) 04/08/2021 1659   CHOLHDL 3.8 04/08/2021 1659   VLDL 33 07/11/2015 0755   LDLCALC 95 04/08/2021 1659      Lab Results  Component Value Date   TSH 0.741 10/22/2021   TSH <0.01 (L) 04/08/2021   TSH 1.137 07/21/2018   TSH 0.944 07/11/2015   TSH 1.191 04/30/2015   FREET4 0.83  10/22/2021    Labs from outside facilities: May 08, 2021 TSH less than 0.01, November 12, 2020 TSH less than 0.01, January 11, 2020 TSH 0.759, July 14, 2019 TSH less than 0.01.  Assessment & Plan:   1. Hypothyroidism, unspecified type 2.  Type 2 diabetes, controlled with A1c of 6.8%.  Her previsit thyroid function tests are consistent with appropriate replacement.  She is advised to continue Synthroid 88 mcg p.o. daily before breakfast.  She has done very well compared to NP thyroid.  - We discussed about the correct intake of her thyroid hormone, on empty stomach at fasting, with water, separated by at least 30 minutes from breakfast and other medications,  and separated by more than 4 hours from calcium, iron, multivitamins, acid reflux medications (  PPIs). -Patient is made aware of the fact that thyroid hormone replacement is needed for life, dose to be adjusted by periodic monitoring of thyroid function tests. Her baseline thyroid ultrasound is unremarkable.  Regarding her type 2 diabetes with point-of-care A1c of 6.8% increasing from 6.1%.  She is advised to increase her Rybelsus to 14 mg p.o. daily before breakfast.  - she acknowledges that there is a room for improvement in her food and drink choices. - Suggestion is made for her to avoid simple carbohydrates  from her diet including Cakes, Sweet Desserts, Ice Cream, Soda (diet and regular), Sweet Tea, Candies, Chips, Cookies, Store Bought Juices, Alcohol , Artificial Sweeteners,  Coffee Creamer, and "Sugar-free" Products, Lemonade. This will help patient to have more stable blood glucose profile and potentially avoid unintended weight gain.  The following Lifestyle Medicine recommendations according to [facility]) were discussed and and offered to patient and she  agrees to start the journey:  A. Whole Foods, Plant-Based Nutrition comprising of fruits and vegetables, plant-based proteins, whole-grain  carbohydrates was discussed in detail with the patient.   A list for source of those nutrients were also provided to the patient.  Patient will use only water or unsweetened tea for hydration. B.  The need to stay away from risky substances including alcohol, smoking; obtaining 7 to 9 hours of restorative sleep, at least 150 minutes of moderate intensity exercise weekly, the importance of healthy social connections,  and stress management techniques were discussed. C.  A full color page of  Calorie density of various food groups per pound showing examples of each food groups was provided to the patient.  She is advised to maintain close follow-up with her PMD.    I spent 31 minutes in the care of the patient today including review of labs from Thyroid Function, CMP, and other relevant labs ; imaging/biopsy records (current and previous including abstractions from other facilities); face-to-face time discussing  her lab results and symptoms, medications doses, her options of short and long term treatment based on the latest standards of care / guidelines;   and documenting the encounter.  Heather Burnett  participated in the discussions, expressed understanding, and voiced agreement with the above plans.  All questions were answered to her satisfaction. she is encouraged to contact clinic should she have any questions or concerns prior to her return visit.  Follow up plan: Return in about 6 months (around 04/26/2022) for F/U with Pre-visit Labs, A1c -NV.   Glade Lloyd, MD [facility] [address] [place], [place] [address] Phone: [phone]  Fax: [phone]     10/27/2021, 4:01 PM  This note was partially dictated with voice recognition software. Similar sounding words can be transcribed inadequately or may not  be corrected upon review.

## 2021-10-27 NOTE — Patient Instructions (Signed)

## 2021-10-28 ENCOUNTER — Other Ambulatory Visit (HOSPITAL_COMMUNITY): Payer: Self-pay

## 2021-10-29 ENCOUNTER — Other Ambulatory Visit (HOSPITAL_COMMUNITY): Payer: Self-pay

## 2021-10-31 ENCOUNTER — Other Ambulatory Visit (HOSPITAL_COMMUNITY): Payer: Self-pay

## 2021-10-31 MED ORDER — SPIRONOLACTONE 100 MG PO TABS
ORAL_TABLET | ORAL | 3 refills | Status: DC
  Filled 2021-10-31: qty 90, 90d supply, fill #0
  Filled 2022-02-12: qty 90, 90d supply, fill #1
  Filled 2022-05-12 – 2022-08-03 (×2): qty 90, 90d supply, fill #2

## 2021-10-31 MED ORDER — LAMOTRIGINE 200 MG PO TABS
200.0000 mg | ORAL_TABLET | Freq: Every day | ORAL | 0 refills | Status: DC
  Filled 2021-10-31 – 2021-12-24 (×2): qty 90, 90d supply, fill #0

## 2021-10-31 MED ORDER — CITALOPRAM HYDROBROMIDE 20 MG PO TABS
20.0000 mg | ORAL_TABLET | Freq: Every day | ORAL | 0 refills | Status: DC
  Filled 2021-10-31: qty 90, 90d supply, fill #0

## 2021-10-31 MED ORDER — LAMOTRIGINE 25 MG PO TABS
50.0000 mg | ORAL_TABLET | Freq: Every evening | ORAL | 0 refills | Status: DC
  Filled 2021-10-31 – 2021-12-24 (×2): qty 180, 90d supply, fill #0

## 2021-10-31 MED ORDER — DULOXETINE HCL 60 MG PO CPEP
ORAL_CAPSULE | ORAL | 0 refills | Status: DC
  Filled 2021-10-31 – 2022-01-01 (×2): qty 90, 90d supply, fill #0

## 2021-10-31 MED ORDER — PROGESTERONE 200 MG PO CAPS
ORAL_CAPSULE | ORAL | 3 refills | Status: DC
  Filled 2021-10-31: qty 180, 90d supply, fill #0
  Filled 2022-02-12: qty 180, 90d supply, fill #1
  Filled 2022-05-12 – 2022-05-24 (×2): qty 180, 90d supply, fill #2
  Filled 2022-09-02: qty 60, 30d supply, fill #3
  Filled 2022-10-15 – 2022-10-23 (×2): qty 60, 30d supply, fill #4

## 2021-11-01 ENCOUNTER — Other Ambulatory Visit (HOSPITAL_COMMUNITY): Payer: Self-pay

## 2021-11-03 ENCOUNTER — Encounter: Payer: Self-pay | Admitting: Emergency Medicine

## 2021-11-03 ENCOUNTER — Other Ambulatory Visit: Payer: Self-pay

## 2021-11-03 ENCOUNTER — Ambulatory Visit (INDEPENDENT_AMBULATORY_CARE_PROVIDER_SITE_OTHER): Payer: No Typology Code available for payment source

## 2021-11-03 ENCOUNTER — Ambulatory Visit
Admission: EM | Admit: 2021-11-03 | Discharge: 2021-11-03 | Disposition: A | Discharge status: Home / Self Care | Payer: No Typology Code available for payment source | Attending: Urgent Care | Admitting: Urgent Care

## 2021-11-03 DIAGNOSIS — R059 Cough, unspecified: Secondary | ICD-10-CM | POA: Diagnosis not present

## 2021-11-03 DIAGNOSIS — E119 Type 2 diabetes mellitus without complications: Secondary | ICD-10-CM | POA: Diagnosis not present

## 2021-11-03 DIAGNOSIS — R052 Subacute cough: Secondary | ICD-10-CM | POA: Diagnosis not present

## 2021-11-03 DIAGNOSIS — J4530 Mild persistent asthma, uncomplicated: Secondary | ICD-10-CM

## 2021-11-03 DIAGNOSIS — J018 Other acute sinusitis: Secondary | ICD-10-CM | POA: Diagnosis not present

## 2021-11-03 DIAGNOSIS — J3089 Other allergic rhinitis: Secondary | ICD-10-CM | POA: Diagnosis not present

## 2021-11-03 MED ORDER — PROMETHAZINE-DM 6.25-15 MG/5ML PO SYRP: 5.0000 mL | ORAL_SOLUTION | Freq: Every evening | ORAL | 0 refills | Status: DC | PRN

## 2021-11-03 MED ORDER — DOXYCYCLINE HYCLATE 100 MG PO CAPS
100.0000 mg | ORAL_CAPSULE | Freq: Two times a day (BID) | ORAL | 0 refills | Status: DC
Start: 2021-11-03 — End: 2022-07-27

## 2021-11-03 MED ORDER — LEVOCETIRIZINE DIHYDROCHLORIDE 5 MG PO TABS: 5.0000 mg | ORAL_TABLET | Freq: Every evening | ORAL | 0 refills | Status: DC

## 2021-11-03 MED ORDER — PREDNISONE 50 MG PO TABS: 50.0000 mg | ORAL_TABLET | Freq: Every day | ORAL | 0 refills | Status: DC

## 2021-11-03 MED ORDER — BENZONATATE 100 MG PO CAPS: 100.0000 mg | ORAL_CAPSULE | Freq: Three times a day (TID) | ORAL | 0 refills | Status: DC | PRN

## 2021-11-03 NOTE — ED Provider Notes (Signed)
[place]   MRN: [id number] DOB: [birth year]  Subjective:   Heather Burnett is a 51 y.o. female presenting for 3-week history of acute onset persistent and worsening sinus congestion, body pains, throat pain, coughing.  She does have a history of asthma and allergic rhinitis.  Is using her medications consistently for this.  She did have an office visit through telehealth with her PCP and was prescribed azithromycin without any testing.  This did not help and in fact she has gotten worse.  Her diabetes is well controlled, not on insulin.  No current facility-administered medications for this encounter.  Current Outpatient Medications:    acetaminophen (TYLENOL) 500 MG tablet, Take 500 mg by mouth every 6 (six) hours as needed for mild pain or headache. , Disp: , Rfl:    albuterol (VENTOLIN HFA) 108 (90 Base) MCG/ACT inhaler, Inhale 2 inhalations into the lungs every 6 (six) hours as needed for Wheezing, Disp: 18 g, Rfl: 11   atorvastatin (LIPITOR) 40 MG tablet, Take 1 tablet (40 mg total) by mouth once daily, Disp: 30 tablet, Rfl: 11   celecoxib (CELEBREX) 100 MG capsule, Take 1 capsule (100 mg total) by mouth 2 (two) times daily., Disp: 60 capsule, Rfl: 0   cetirizine (ZYRTEC) 10 MG tablet, Take 10 mg by mouth daily as needed (seasonal allergies.). , Disp: , Rfl:    Cholecalciferol (VITAMIN D-3) 125 MCG (5000 UT) TABS, Take 5,000 Units by mouth daily., Disp: , Rfl:    citalopram (CELEXA) 20 MG tablet, Take 1 tablet (20 mg total) by mouth daily., Disp: 90 tablet, Rfl: 0   citalopram (CELEXA) 20 MG tablet, Take 1 tablet by mouth daily with breakfast., Disp: 90 tablet, Rfl: 0   clonazePAM (KLONOPIN) 0.5 MG tablet, Take 0.5 mg by mouth daily as needed for anxiety., Disp: , Rfl:    doxycycline (VIBRAMYCIN) 100 MG capsule, Take 100 mg by mouth 2 (two) times daily as needed (acne flare ups). , Disp: , Rfl:    DULoxetine (CYMBALTA) 60 MG capsule, Take 1 capsule by mouth daily at 8  am. (Patient not taking: Reported on 10/27/2021), Disp: 90 capsule, Rfl: 1   DULoxetine (CYMBALTA) 60 MG capsule, Take 1 capsule by mouth daily at  8:00am, Disp: 90 capsule, Rfl: 0   fluticasone (FLOVENT HFA) 110 MCG/ACT inhaler, Inhale 1 puff by mouth 2 (two) times daily (Patient taking differently: Inhale into the lungs as needed.), Disp: 12 g, Rfl: 11   Fluticasone-Salmeterol (ADVAIR) 250-50 MCG/DOSE AEPB, Inhale 1 puff into the lungs every 12 (twelve) hours as needed (seasonal allergies). , Disp: , Rfl:    furosemide (LASIX) 40 MG tablet, Take 40 mg by mouth daily. , Disp: , Rfl:    glucose blood (FREESTYLE LITE) test strip, use to test blood glucose once daily as directed, Disp: 100 each, Rfl: 0   hydrOXYzine (VISTARIL) 25 MG capsule, Take 1 capsule (25 mg total) by mouth 2 (two) times daily as needed. (Patient not taking: Reported on 10/27/2021), Disp: 180 capsule, Rfl: 0   hydrOXYzine (VISTARIL) 25 MG capsule, Take 1 capsule by mouth twice daily as needed, Disp: 60 capsule, Rfl: 0   lamoTRIgine (LAMICTAL) 200 MG tablet, Take 1 tablet by mouth daily., Disp: 90 tablet, Rfl: 0   lamoTRIgine (LAMICTAL) 25 MG tablet, Take 2 tablets by mouth nightly (Patient not taking: Reported on 10/27/2021), Disp: 60 tablet, Rfl: 0   lamoTRIgine (LAMICTAL) 25 MG tablet, Take 2 tablets by mouth every evening., Disp: 409  tablet, Rfl: 0   metoprolol succinate (TOPROL XL) 50 MG 24 hr tablet, Take 1 tablet (50 mg total) by mouth 2 (two) times daily. Take with or immediately following a meal. (Patient not taking: Reported on 10/27/2021), Disp: 180 tablet, Rfl: 3   metoprolol tartrate (LOPRESSOR) 50 MG tablet, Take 1 tablet (50 mg total) by mouth 2 (two) times daily, Disp: 60 tablet, Rfl: 11   progesterone (PROMETRIUM) 200 MG capsule, TAKE 2 CAPSULES BY MOUTH EVERY NIGHT, Disp: 60 capsule, Rfl: 1   progesterone (PROMETRIUM) 200 MG capsule, Take 2 capsules (400 mg total) by mouth at bedtime., Disp: 180 capsule, Rfl: 3    Semaglutide (RYBELSUS) 14 MG TABS, Take 1 tablet by mouth daily., Disp: 90 tablet, Rfl: 1   spironolactone (ALDACTONE) 100 MG tablet, Take 1 tablet (100 mg total) by mouth daily. (Patient not taking: Reported on 10/27/2021), Disp: 90 tablet, Rfl: 0   spironolactone (ALDACTONE) 100 MG tablet, Take 1 tablet (100 mg total) by mouth once daily., Disp: 90 tablet, Rfl: 3   spironolactone (ALDACTONE) 25 MG tablet, Take 1 tablet (25 mg total) by mouth once daily, Disp: 30 tablet, Rfl: 11   SYNTHROID 88 MCG tablet, Take 1 tablet by mouth daily before breakfast., Disp: 30 tablet, Rfl: 2   tiZANidine (ZANAFLEX) 4 MG tablet, Take 1 tablet (4 mg total) by mouth 3 (three) times daily as needed, Disp: 20 tablet, Rfl: 1   triamcinolone cream (KENALOG) 0.1 %, Apply topically 2 (two) times daily as needed., Disp: , Rfl:    Allergies  Allergen Reactions   Liraglutide -Weight Management Other (See Comments)    GI upset   Polysorbate    Sorbitan Other (See Comments)    GI upset   Adhesive [Tape] Itching and Rash    Paper tape or coban is easier on the skin.   Latex Rash   Penicillins Rash    Updated on 09/26/2020 following APP conversation with patient.  Swelling of the face/tongue/throat, SOB, or low BP? NO Sudden or severe rash/hives, skin peeling, or any reaction on the inside of your mouth or nose? NO - low severity rash to body.  Seek medical attention at a hospital or doctor's office? Yes When did it last happen? Age 37 following "a shot of PCN".       If all above answers are "NO", may proceed with cephalosporin use. - Has taken cefdinir and cephalexin without issues.      Past Medical History:  Diagnosis Date   Allergy    Anxiety    Asthma    WELL CONTROLLED   Cancer ([place])    melanoma on my chest   COPD (chronic obstructive pulmonary disease) ([place])    not sure about COPD    DDD (degenerative disc disease), cervical    and lower back   Depression    Diabetes mellitus, type II ([place])    DJD  (degenerative joint disease)    Dysrhythmia    Empty sella syndrome ([place])    followed at [facility]   Family history of adverse reaction to anesthesia    mother almost died with anesthesia in the past, hard to wake up and got real sick on my stomach vomiting, now she receives local anethesia.   GERD (gastroesophageal reflux disease)    Headache    history of migraine   Heart murmur    History of methicillin resistant staphylococcus aureus (MRSA)    History of palpitations 07/2004   Hyperlipidemia  Hypertension    Hypothyroidism    Hypothyroidism, adult 02/08/2020   Lymphocytosis    Neuropathy 2016   Osteoporosis    PCOS (polycystic ovarian syndrome)    Pituitary microadenoma ([place])    followed at [facility]   Pneumonia 2008   Pre-diabetes    Sleep apnea    USES CPAP   Thyroid disease    Von Willebrand disease 2012   PT STATES SHE HAS NEVER HAD ANY ISSUES WITH BLEEDING     Past Surgical History:  Procedure Laterality Date   ACHILLES TENDON SURGERY Left 12/15/2019   Procedure: ACHILLES TENDON REPAIR PRIMARY;  Surgeon: Samara Deist, DPM;  Location: [facility] ORS;  Service: Podiatry;  Laterality: Left;   ANTERIOR CERVICAL DECOMP/DISCECTOMY FUSION N/A 10/02/2020   Procedure: ANTERIOR CERVICAL DECOMPRESSION/DISCECTOMY FUSION 1 LEVEL C6-7;  Surgeon: Meade Maw, MD;  Location: [facility] ORS;  Service: Neurosurgery;  Laterality: N/A;   BACK SURGERY     removal of 2 lumbar vertebrae   BREAST BIOPSY Right 2010   NEG   CHOLECYSTECTOMY     COLONOSCOPY  03/03/2013, 02/06/2008   COLONOSCOPY WITH PROPOFOL N/A 08/05/2018   Procedure: COLONOSCOPY WITH PROPOFOL;  Surgeon: Manya Silvas, MD;  Location: [facility] ENDOSCOPY;  Service: Endoscopy;  Laterality: N/A;   EGD  02/06/2008   GASTROC RECESSION EXTREMITY Left 12/15/2019   Procedure: GASTROC RECESSION EXTREMITY;  Surgeon: Samara Deist, DPM;  Location: [facility] ORS;  Service: Podiatry;  Laterality: Left;   HALLUX VALGUS CHEILECTOMY Left 08/21/2016    Procedure: HALLUX VALGUS CHEILECTOMY;  Surgeon: Sharlotte Alamo, DPM;  Location: [facility] ORS;  Service: Podiatry;  Laterality: Left;   HEMORRHOID SURGERY  10/2014   SPINE SURGERY     TONSILLECTOMY AND ADENOIDECTOMY      Family History  Problem Relation Age of Onset   Thyroid disease Mother    Osteoporosis Mother    Diabetes Father    Hyperlipidemia Father    Hypertension Father    Cancer Father    Heart attack Father    Breast cancer Sister 41   Breast cancer Maternal Aunt 65   Breast cancer Maternal Grandmother 6    Social History   Tobacco Use   Smoking status: Former    Years: 8.00    Types: Cigarettes    Quit date: 04/09/1994    Years since quitting: 27.5   Smokeless tobacco: Never  Vaping Use   Vaping Use: Never used  Substance Use Topics   Alcohol use: Yes    Comment: social   Drug use: No    ROS   Objective:   Vitals: BP 117/71 (BP Location: Right Arm)    Pulse 88    Temp 98.2 F (36.8 C) (Oral)    Resp 18    Ht 5\' 2"  (1.575 m)    Wt 233 lb 11 oz (106 kg)    LMP 10/31/2021 (Approximate)    SpO2 99%    BMI 42.74 kg/m   Physical Exam Constitutional:      General: She is not in acute distress.    Appearance: Normal appearance. She is well-developed and normal weight. She is not ill-appearing, toxic-appearing or diaphoretic.  HENT:     Head: Normocephalic and atraumatic.     Right Ear: Tympanic membrane, ear canal and external ear normal. No drainage or tenderness. No middle ear effusion. There is no impacted cerumen. Tympanic membrane is not erythematous.     Left Ear: Tympanic membrane, ear canal and external ear normal. No drainage or  tenderness.  No middle ear effusion. There is no impacted cerumen. Tympanic membrane is not erythematous.     Nose: Congestion present. No rhinorrhea.     Comments: Patient tenderness over the maxillary sinuses bilaterally.    Mouth/Throat:     Mouth: Mucous membranes are moist. No oral lesions.     Pharynx: No pharyngeal  swelling, oropharyngeal exudate, posterior oropharyngeal erythema or uvula swelling.     Tonsils: No tonsillar exudate or tonsillar abscesses.  Eyes:     General: No scleral icterus.       Right eye: No discharge.        Left eye: No discharge.     Extraocular Movements: Extraocular movements intact.     Right eye: Normal extraocular motion.     Left eye: Normal extraocular motion.     Conjunctiva/sclera: Conjunctivae normal.  Cardiovascular:     Rate and Rhythm: Normal rate and regular rhythm.     Pulses: Normal pulses.     Heart sounds: Normal heart sounds. No murmur heard.   No friction rub. No gallop.  Pulmonary:     Effort: Pulmonary effort is normal. No respiratory distress.     Breath sounds: Normal breath sounds. No stridor. No wheezing, rhonchi or rales.  Musculoskeletal:     Cervical back: Normal range of motion and neck supple.  Lymphadenopathy:     Cervical: No cervical adenopathy.  Skin:    General: Skin is warm and dry.     Findings: No rash.  Neurological:     General: No focal deficit present.     Mental Status: She is alert and oriented to person, place, and time.  Psychiatric:        Mood and Affect: Mood normal.        Behavior: Behavior normal.        Thought Content: Thought content normal.        Judgment: Judgment normal.    DG Chest 2 View  Result Date: 11/03/2021 CLINICAL DATA:  Cough. EXAM: CHEST - 2 VIEW COMPARISON:  06/10/2012 FINDINGS: Both lungs are clear. Heart and mediastinum are within normal limits. Surgical plate in the lower cervical spine. No pleural effusions. Negative for a pneumothorax. IMPRESSION: No active cardiopulmonary disease. Electronically Signed   By: Markus Daft M.D.   On: 11/03/2021 11:06     Assessment and Plan :   PDMP not reviewed this encounter.  1. Acute non-recurrent sinusitis of other sinus   2. Subacute cough   3. Type 2 diabetes mellitus treated without insulin ([place])   4. Allergic rhinitis due to other allergic  trigger, unspecified seasonality   5. Mild persistent asthma without complication    Will be using an oral prednisone course in the context of her allergic rhinitis and asthma, respiratory symptoms.  Given timeline of her illness will defer respiratory testing.  Will start empiric treatment for sinusitis with doxycycline.  Recommended supportive care otherwise including the use of oral antihistamine, decongestant. Counseled patient on potential for adverse effects with medications prescribed/recommended today, ER and return-to-clinic precautions discussed, patient verbalized understanding.    Jaynee Eagles, PA-C 11/03/21 1152

## 2021-11-03 NOTE — ED Triage Notes (Signed)
Pt reports cough, congestion, body aches, dry mouth, sore throat x3 weeks.   Pt reports saw pcp via telehealth visit and reports was given azithromycin and reports nausea, stomach upset while taking medication. Pt reports has two doses left.   Pt reports last took tylenol for fever this am.

## 2021-11-13 ENCOUNTER — Other Ambulatory Visit (HOSPITAL_COMMUNITY): Payer: Self-pay

## 2021-11-13 MED ORDER — LEVALBUTEROL TARTRATE 45 MCG/ACT IN AERO
INHALATION_SPRAY | RESPIRATORY_TRACT | 12 refills | Status: AC
  Filled 2021-11-13: qty 15, 25d supply, fill #0
  Filled 2021-12-18: qty 15, 25d supply, fill #1
  Filled 2022-10-15 (×2): qty 15, 25d supply, fill #2

## 2021-11-17 ENCOUNTER — Other Ambulatory Visit (HOSPITAL_COMMUNITY): Payer: Self-pay

## 2021-11-27 ENCOUNTER — Other Ambulatory Visit (HOSPITAL_COMMUNITY): Payer: Self-pay

## 2021-11-27 MED ORDER — DOXYCYCLINE HYCLATE 100 MG PO CAPS
100.0000 mg | ORAL_CAPSULE | Freq: Two times a day (BID) | ORAL | 5 refills | Status: DC
  Filled 2021-11-27: qty 14, 7d supply, fill #0

## 2021-11-28 ENCOUNTER — Other Ambulatory Visit: Payer: Self-pay | Admitting: "Endocrinology

## 2021-11-28 ENCOUNTER — Other Ambulatory Visit (HOSPITAL_COMMUNITY): Payer: Self-pay

## 2021-12-01 ENCOUNTER — Other Ambulatory Visit (HOSPITAL_COMMUNITY): Payer: Self-pay

## 2021-12-01 MED ORDER — SYNTHROID 88 MCG PO TABS
88.0000 ug | ORAL_TABLET | Freq: Every day | ORAL | 2 refills | Status: DC
  Filled 2021-12-01: qty 30, 30d supply, fill #0
  Filled 2022-01-13: qty 30, 30d supply, fill #1

## 2021-12-08 ENCOUNTER — Other Ambulatory Visit: Payer: Self-pay

## 2021-12-08 ENCOUNTER — Other Ambulatory Visit (HOSPITAL_COMMUNITY): Payer: Self-pay

## 2021-12-08 MED ORDER — ONDANSETRON 4 MG PO TBDP
ORAL_TABLET | ORAL | 0 refills | Status: DC
  Filled 2021-12-08: qty 20, 6d supply, fill #0

## 2021-12-18 ENCOUNTER — Other Ambulatory Visit (HOSPITAL_COMMUNITY): Payer: Self-pay

## 2021-12-21 ENCOUNTER — Encounter: Payer: Self-pay | Admitting: "Endocrinology

## 2021-12-25 ENCOUNTER — Other Ambulatory Visit (HOSPITAL_COMMUNITY): Payer: Self-pay

## 2021-12-25 ENCOUNTER — Other Ambulatory Visit: Payer: Self-pay

## 2022-01-01 ENCOUNTER — Other Ambulatory Visit (HOSPITAL_COMMUNITY): Payer: Self-pay

## 2022-01-02 ENCOUNTER — Other Ambulatory Visit (HOSPITAL_COMMUNITY): Payer: Self-pay

## 2022-01-02 MED ORDER — HYDROXYZINE PAMOATE 25 MG PO CAPS
ORAL_CAPSULE | ORAL | 0 refills | Status: DC
  Filled 2022-01-02: qty 180, 90d supply, fill #0

## 2022-01-06 ENCOUNTER — Other Ambulatory Visit: Payer: Self-pay

## 2022-01-13 ENCOUNTER — Other Ambulatory Visit (HOSPITAL_COMMUNITY): Payer: Self-pay

## 2022-01-19 ENCOUNTER — Other Ambulatory Visit (HOSPITAL_COMMUNITY): Payer: Self-pay

## 2022-01-19 MED ORDER — OMEPRAZOLE 20 MG PO CPDR
DELAYED_RELEASE_CAPSULE | ORAL | 2 refills | Status: DC
  Filled 2022-01-19: qty 14, 14d supply, fill #0

## 2022-01-22 ENCOUNTER — Other Ambulatory Visit: Payer: Self-pay | Admitting: Internal Medicine

## 2022-01-22 DIAGNOSIS — R1114 Bilious vomiting: Secondary | ICD-10-CM

## 2022-01-28 DIAGNOSIS — Z0289 Encounter for other administrative examinations: Secondary | ICD-10-CM

## 2022-01-29 ENCOUNTER — Ambulatory Visit
Admission: RE | Admit: 2022-01-29 | Discharge: 2022-01-29 | Disposition: A | Discharge status: Home / Self Care | Payer: No Typology Code available for payment source | Source: Ambulatory Visit | Attending: Internal Medicine | Admitting: Internal Medicine

## 2022-01-29 DIAGNOSIS — R1114 Bilious vomiting: Secondary | ICD-10-CM

## 2022-01-29 MED ORDER — IOPAMIDOL (ISOVUE-300) INJECTION 61%
100.0000 mL | Freq: Once | INTRAVENOUS | Status: AC | PRN
  Administered 2022-01-29: 100 mL via INTRAVENOUS

## 2022-02-03 ENCOUNTER — Other Ambulatory Visit (HOSPITAL_COMMUNITY): Payer: Self-pay

## 2022-02-03 ENCOUNTER — Other Ambulatory Visit: Payer: Self-pay | Admitting: Internal Medicine

## 2022-02-03 DIAGNOSIS — Z1231 Encounter for screening mammogram for malignant neoplasm of breast: Secondary | ICD-10-CM

## 2022-02-03 MED ORDER — PANTOPRAZOLE SODIUM 40 MG PO TBEC
DELAYED_RELEASE_TABLET | ORAL | 11 refills | Status: DC
  Filled 2022-02-03: qty 30, 30d supply, fill #0
  Filled 2022-05-12: qty 30, 30d supply, fill #1

## 2022-02-03 MED ORDER — FUROSEMIDE 40 MG PO TABS
ORAL_TABLET | ORAL | 5 refills | Status: DC
  Filled 2022-02-03: qty 30, 30d supply, fill #0

## 2022-02-05 ENCOUNTER — Encounter: Payer: Self-pay | Admitting: "Endocrinology

## 2022-02-05 ENCOUNTER — Other Ambulatory Visit (HOSPITAL_COMMUNITY): Payer: Self-pay

## 2022-02-05 ENCOUNTER — Other Ambulatory Visit: Payer: Self-pay | Admitting: "Endocrinology

## 2022-02-05 MED ORDER — RYBELSUS 7 MG PO TABS
7.0000 mg | ORAL_TABLET | Freq: Every day | ORAL | 2 refills | Status: DC
  Filled 2022-02-05: qty 30, 30d supply, fill #0

## 2022-02-06 ENCOUNTER — Other Ambulatory Visit (HOSPITAL_COMMUNITY): Payer: Self-pay

## 2022-02-06 MED ORDER — LAMOTRIGINE 200 MG PO TABS
ORAL_TABLET | Freq: Every day | ORAL | 0 refills | Status: DC
  Filled 2022-03-12: qty 90, 90d supply, fill #0

## 2022-02-06 MED ORDER — DULOXETINE HCL 60 MG PO CPEP
ORAL_CAPSULE | ORAL | 0 refills | Status: DC
  Filled 2022-02-06 – 2022-05-06 (×2): qty 90, 90d supply, fill #0

## 2022-02-06 MED ORDER — LAMOTRIGINE 25 MG PO TABS
ORAL_TABLET | ORAL | 0 refills | Status: DC
  Filled 2022-03-12: qty 180, 90d supply, fill #0

## 2022-02-06 MED ORDER — HYDROXYZINE PAMOATE 25 MG PO CAPS
ORAL_CAPSULE | ORAL | 0 refills | Status: DC
  Filled 2022-05-06: qty 180, 90d supply, fill #0

## 2022-02-09 ENCOUNTER — Telehealth: Payer: Self-pay

## 2022-02-09 NOTE — Telephone Encounter (Signed)
Sent in PA request for Rybelsus to pt's insurance through Covermymeds. ?

## 2022-02-10 ENCOUNTER — Encounter (INDEPENDENT_AMBULATORY_CARE_PROVIDER_SITE_OTHER): Payer: Self-pay | Admitting: Bariatrics

## 2022-02-10 ENCOUNTER — Ambulatory Visit (INDEPENDENT_AMBULATORY_CARE_PROVIDER_SITE_OTHER): Payer: No Typology Code available for payment source | Admitting: Bariatrics

## 2022-02-10 ENCOUNTER — Other Ambulatory Visit (HOSPITAL_COMMUNITY): Payer: Self-pay

## 2022-02-10 VITALS — BP 141/74 | HR 88 | Temp 97.6°F | Ht 62.0 in | Wt 235.0 lb

## 2022-02-10 DIAGNOSIS — E559 Vitamin D deficiency, unspecified: Secondary | ICD-10-CM

## 2022-02-10 DIAGNOSIS — Z6841 Body Mass Index (BMI) 40.0 and over, adult: Secondary | ICD-10-CM

## 2022-02-10 DIAGNOSIS — Z1331 Encounter for screening for depression: Secondary | ICD-10-CM

## 2022-02-10 DIAGNOSIS — E66813 Obesity, class 3: Secondary | ICD-10-CM

## 2022-02-10 DIAGNOSIS — E1169 Type 2 diabetes mellitus with other specified complication: Secondary | ICD-10-CM

## 2022-02-10 DIAGNOSIS — E669 Obesity, unspecified: Secondary | ICD-10-CM

## 2022-02-10 DIAGNOSIS — E282 Polycystic ovarian syndrome: Secondary | ICD-10-CM | POA: Diagnosis not present

## 2022-02-10 DIAGNOSIS — R0602 Shortness of breath: Secondary | ICD-10-CM | POA: Diagnosis not present

## 2022-02-10 DIAGNOSIS — I10 Essential (primary) hypertension: Secondary | ICD-10-CM

## 2022-02-10 DIAGNOSIS — E7849 Other hyperlipidemia: Secondary | ICD-10-CM

## 2022-02-10 DIAGNOSIS — F5089 Other specified eating disorder: Secondary | ICD-10-CM

## 2022-02-10 DIAGNOSIS — R5383 Other fatigue: Secondary | ICD-10-CM

## 2022-02-10 DIAGNOSIS — Z7984 Long term (current) use of oral hypoglycemic drugs: Secondary | ICD-10-CM

## 2022-02-10 DIAGNOSIS — E038 Other specified hypothyroidism: Secondary | ICD-10-CM | POA: Diagnosis not present

## 2022-02-11 ENCOUNTER — Other Ambulatory Visit: Payer: Self-pay | Admitting: "Endocrinology

## 2022-02-11 ENCOUNTER — Encounter (INDEPENDENT_AMBULATORY_CARE_PROVIDER_SITE_OTHER): Payer: Self-pay | Admitting: Bariatrics

## 2022-02-11 ENCOUNTER — Other Ambulatory Visit (HOSPITAL_COMMUNITY): Payer: Self-pay

## 2022-02-11 ENCOUNTER — Encounter: Payer: Self-pay | Admitting: "Endocrinology

## 2022-02-11 DIAGNOSIS — R7989 Other specified abnormal findings of blood chemistry: Secondary | ICD-10-CM | POA: Insufficient documentation

## 2022-02-11 LAB — VITAMIN D 25 HYDROXY (VIT D DEFICIENCY, FRACTURES): Vit D, 25-Hydroxy: 39.9 ng/mL (ref 30.0–100.0)

## 2022-02-11 LAB — LIPID PANEL WITH LDL/HDL RATIO
Cholesterol, Total: 147 mg/dL (ref 100–199)
HDL: 46 mg/dL (ref >39)
LDL Chol Calc (NIH): 71 mg/dL (ref 0–99)
LDL/HDL Ratio: 1.5 ratio (ref 0.0–3.2)
Triglycerides: 181 mg/dL — ABNORMAL HIGH (ref 0–149)
VLDL Cholesterol Cal: 30 mg/dL (ref 5–40)

## 2022-02-11 LAB — HEMOGLOBIN A1C
Est. average glucose Bld gHb Est-mCnc: 148 mg/dL
Hgb A1c MFr Bld: 6.8 % — ABNORMAL HIGH (ref 4.8–5.6)

## 2022-02-11 LAB — COMPREHENSIVE METABOLIC PANEL
ALT: 21 IU/L (ref 0–32)
AST: 21 IU/L (ref 0–40)
Albumin/Globulin Ratio: 1.9 (ref 1.2–2.2)
Albumin: 4.3 g/dL (ref 3.8–4.8)
Alkaline Phosphatase: 62 IU/L (ref 44–121)
BUN/Creatinine Ratio: 11 (ref 9–23)
BUN: 9 mg/dL (ref 6–24)
Bilirubin Total: 0.4 mg/dL (ref 0.0–1.2)
CO2: 25 mmol/L (ref 20–29)
Calcium: 9.8 mg/dL (ref 8.7–10.2)
Chloride: 100 mmol/L (ref 96–106)
Creatinine, Ser: 0.85 mg/dL (ref 0.57–1.00)
Globulin, Total: 2.3 g/dL (ref 1.5–4.5)
Glucose: 124 mg/dL — ABNORMAL HIGH (ref 70–99)
Potassium: 4.5 mmol/L (ref 3.5–5.2)
Sodium: 140 mmol/L (ref 134–144)
Total Protein: 6.6 g/dL (ref 6.0–8.5)
eGFR: 84 mL/min/{1.73_m2} (ref >59)

## 2022-02-11 LAB — TSH+T4F+T3FREE
Free T4: 1.37 ng/dL (ref 0.82–1.77)
T3, Free: 3 pg/mL (ref 2.0–4.4)
TSH: 0.31 u[IU]/mL — ABNORMAL LOW (ref 0.450–4.500)

## 2022-02-11 LAB — INSULIN, RANDOM: INSULIN: 32.3 u[IU]/mL — ABNORMAL HIGH (ref 2.6–24.9)

## 2022-02-11 MED ORDER — SYNTHROID 75 MCG PO TABS
75.0000 ug | ORAL_TABLET | Freq: Every day | ORAL | 1 refills | Status: DC
  Filled 2022-02-11: qty 90, 90d supply, fill #0

## 2022-02-12 ENCOUNTER — Other Ambulatory Visit (HOSPITAL_COMMUNITY): Payer: Self-pay

## 2022-02-12 ENCOUNTER — Other Ambulatory Visit: Payer: Self-pay | Admitting: "Endocrinology

## 2022-02-12 MED ORDER — FLUTICASONE PROPIONATE HFA 110 MCG/ACT IN AERO
INHALATION_SPRAY | RESPIRATORY_TRACT | 11 refills | Status: AC
  Filled 2022-02-12: qty 12, 30d supply, fill #0
  Filled 2022-10-15: qty 12, 30d supply, fill #1

## 2022-02-12 NOTE — Telephone Encounter (Signed)
Please review

## 2022-02-17 NOTE — Progress Notes (Signed)
?Office: [phone]  /  Fax: [phone] ? ? ? ?Date: 03/03/2022   ?Appointment Start Time: 3:02pm ?Duration: 51 minutes ?Provider: Glennie Isle, Psy.D. ?Type of Session: Intake for Individual Therapy  ?Location of Patient: Work (private location) ?Location of Provider: Provider's home (private office) ?Type of Contact: Telepsychological Visit via MyChart Video Visit ? ?Informed Consent: Prior to proceeding with today's appointment, two pieces of identifying information were obtained. In addition, Debria's physical location at the time of this appointment was obtained as well a phone number she could be reached at in the event of technical difficulties. Alnita and this provider participated in today's telepsychological service.  ? ?The provider's role was explained to [employer]. The provider reviewed and discussed issues of confidentiality, privacy, and limits therein (e.g., reporting obligations). In addition to verbal informed consent, written informed consent for psychological services was obtained prior to the initial appointment. Since the clinic is not a 24/7 crisis center, mental health emergency resources were shared and this  provider explained MyChart, e-mail, voicemail, and/or other messaging systems should be utilized only for non-emergency reasons. This provider also explained that information obtained during appointments will be placed in [facility] record and relevant information will be shared with other providers at [facility] for coordination of care. Babe agreed information may be shared with other [facility] providers as needed for coordination of care and by signing the service agreement document, she provided written consent for coordination of care. Prior to initiating telepsychological services, Aemilia completed an informed consent document, which included the development of a safety plan (i.e., an emergency contact and emergency  resources) in the event of an emergency/crisis. Alasia verbally acknowledged understanding she is ultimately responsible for understanding her insurance benefits for telepsychological and in-person services. This provider also reviewed confidentiality, as it relates to telepsychological services, as well as the rationale for telepsychological services (i.e., to reduce exposure risk to COVID-19). Monice  acknowledged understanding that appointments cannot be recorded without both party consent and she is aware she is responsible for securing confidentiality on her end of the session. Nou verbally consented to proceed. ? ?Per [facility] new policy, Kesia will be e-mailed two additional forms (AOB and Receipt of NPP) to sign as well as [facility]'s Notice of Privacy Practices. The content of the documents were explained to [place] at the onset of the appointment, and she agreed to proceed. Additionally, she agreed to complete the forms and return them prior to the next appointment with this provider.  ? ?Chief Complaint/HPI: Sherryn was referred by Dr. Jearld Lesch due to other disorder of eating. Per the note for the initial visit with Dr. Jearld Lesch on 02/10/2022, "Bonnie notes some night time eating and some binge behavior." The note for the initial appointment further indicated the following: "Etosha's habits were reviewed today and are as follows: Her family eats meals together, she struggles with family and or coworkers weight loss sabotage, she has been heavy most of her life, she started gaining weight in her 20's, her heaviest weight ever was 298 pounds, she has significant food cravings issues, she snacks frequently in the evenings, she wakes up frequently in the middle of the night to eat, she is frequently drinking liquids with calories, she frequently makes poor food choices, she has problems with excessive hunger, she frequently eats larger portions than normal, and she struggles with emotional  eating." Jannis's Food and Mood (modified PHQ-9) score on 02/10/2022 was 17. ? ?During today's  appointment, Edwina reported engaging in emotional eating behaviors secondary to boredom, anxiety, and stress due to work and conflict with her mother. She was verbally administered a questionnaire assessing various behaviors related to emotional eating behaviors. Marice endorsed the following: experience food cravings on a regular basis, eat certain foods when you are anxious, stressed, depressed, or your feelings are hurt, use food to help you cope with emotional situations, find food is comforting to you, overeat frequently when you are bored or lonely, overeat when you are angry at someone just to show them they cannot control you, overeat when you are alone, but eat much less when you are with other people, and eat as a reward. She shared she craves sweets (e.g., cookies, peanut butter crackers) and sodas. Romey believes the onset of emotional eating behaviors was likely in childhood, and described the current frequency of emotional eating behaviors as "few times a week." In addition, Yovana endorsed a history of binge eating behaviors. She described mindlessly eating a bag of potato chips or other foods. She denied feeling out of control in those moments. She described the frequency of the aforementioned as "2-3 times a week," adding she does not sleep "very well" and that is when she engages in overeating behaviors. During teenage years, Vaunda recalled significantly restricting food intake and eating "raw rice" to fill herself up to lose weight. She indicated she also took laxatives for weight loss. Saydi stated she last used laxatives for weight loss and restricted food intake 15-16 years ago. Latrece stated she has never been diagnosed with an eating disorder. She also denied a history of treatment for emotional or binge eating behaviors. Furthermore, Lille denied other problems of concern.   ? ?Mental  Status Examination:  ?Appearance: neat ?Behavior: appropriate to circumstances ?Mood: anxious ?Affect: mood congruent ?Speech: WNL ?Eye Contact: appropriate ?Psychomotor Activity: WNL ?Gait: unable to assess  ?Thought Process: linear, logical, and goal directed and denies suicidal, homicidal, and self-harm ideation, plan and intent  ?Thought Content/Perception: no hallucinations, delusions, bizarre thinking or behavior endorsed or observed ?Orientation: AAOx4 ?Memory/Concentration: memory, attention, language, and fund of knowledge intact  ?Insight/Judgment: fair ? ?Family & Psychosocial History: Christiana reported she is in a relationship and she has one adult daughter. 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 Additionally, Alonie shared her highest level of education obtained is an associate's degree. Currently, Gracemarie's social support system consists of her boyfriend and couple of friends. Moreover, Ziomara stated she resides with her mother, three dogs, turtle, bird, two hedgehogs, and two [place] pigs.  ? ?Medical History:  ?Past Medical History:  ?Diagnosis Date  ? Allergy   ? Anxiety   ? Asthma   ? WELL CONTROLLED  ? Back pain   ? Cancer [facility])   ? melanoma on my chest  ? Chest pain   ? Constipation   ? COPD (chronic obstructive pulmonary disease) ([place])   ? not sure about COPD   ? DDD (degenerative disc disease), cervical   ? and lower back  ? Depression   ? Diabetes mellitus, type II ([place])   ? Diverticulitis   ? DJD (degenerative joint disease)   ? Dysrhythmia   ? Empty sella syndrome ([place])   ? [facility] at [facility]  ? Empty sella syndrome ([place])   ? Family history of adverse reaction to anesthesia   ? mother almost died with anesthesia in the past, hard to wake up and got real  sick on my stomach vomiting, now she receives local anethesia.  ? GERD (gastroesophageal reflux disease)   ? Headache   ? history of migraine  ? Heart murmur   ? Hiatal hernia   ? History  of methicillin resistant staphylococcus aureus (MRSA)   ? History of palpitations 07/2004  ? Hyperlipidemia   ? Hypertension   ? Hypothyroidism   ? Hypothyroidism, adult 02/08/2020  ? Idiopathic peripheral neurop

## 2022-02-19 ENCOUNTER — Encounter (INDEPENDENT_AMBULATORY_CARE_PROVIDER_SITE_OTHER): Payer: Self-pay | Admitting: Bariatrics

## 2022-02-19 NOTE — Progress Notes (Signed)
? ? ? ?Chief Complaint:  ? ?OBESITY ?Heather Burnett (MR# [id number]) is a 51 y.o. female who presents for evaluation and treatment of obesity and related comorbidities. Current BMI is Body mass index is 42.98 kg/m?Heather Burnett has been struggling with her weight for many years and has been unsuccessful in either losing weight, maintaining weight loss, or reaching her healthy weight goal. ? ?Heather Burnett states that she does enjoy cooking. She sometimes snacks at night.  ? ?Heather Burnett is currently in the action stage of change and ready to dedicate time achieving and maintaining a healthier weight. Heather Burnett is interested in becoming our patient and working on Heather Burnett including (but not limited to) diet and exercise for weight loss. ? ?Heather Burnett's habits were reviewed today and are as follows: Her family eats meals together, she struggles with family and or coworkers weight loss sabotage, she has been heavy most of her life, she started gaining weight in her 20's, her heaviest weight ever was 298 pounds, she has significant food cravings issues, she snacks frequently in the evenings, she wakes up frequently in the middle of the night to eat, she is frequently drinking liquids with calories, she frequently makes poor food choices, she has problems with excessive hunger, she frequently eats larger portions than normal, and she struggles with emotional eating. ? ?Depression Screen ?Heather Burnett's Food and Mood (modified PHQ-9) score was 17. ? ? ?  02/10/2022  ?  6:57 AM  ?Depression screen PHQ 2/9  ?Decreased Interest 1  ?Down, Depressed, Hopeless 3  ?PHQ - 2 Score 4  ?Altered sleeping 2  ?Tired, decreased energy 3  ?Change in appetite 3  ?Feeling bad or failure about yourself  2  ?Trouble concentrating 2  ?Moving slowly or fidgety/restless 0  ?Suicidal thoughts 1  ?PHQ-9 Score 17  ?Difficult doing work/chores Somewhat difficult  ? ?Subjective:  ? ?1. Other fatigue ?Heather Burnett admits to daytime somnolence and admits  to waking up still tired. Patient has a history of symptoms of daytime fatigue, morning fatigue, and morning headache. [name] generally gets 4 or 5 hours of sleep per night, and states that she has nightime awakenings and difficulty falling back asleep if awakened. Snoring is present. Apneic episodes is present. Epworth Sleepiness Score is 14.  Heather Burnett will continue activities.  ? ?2. SOB (shortness of breath) on exertion ?Heather Burnett notes increasing shortness of breath with exercising and seems to be worsening over time with weight gain. She notes getting out of breath sooner with activity than she used to. This has not gotten worse recently. [name] denies shortness of breath at rest or orthopnea.  ? ?3. PCOS (polycystic ovarian syndrome) ?Heather Burnett is currently taking Rybelsus and spironolactone.  ? ?4. Essential hypertension ?Heather Burnett is taking metoprolol, chlorpheniramine, and spironolactone currently. Her blood pressure is well controlled 141/74. ? ?5. Type 2 diabetes mellitus with other specified complication, without long-term current use of insulin ([name]) ?Heather Burnett is currently taking Rybelsus.  ? ?6. Other specified hypothyroidism ?Heather Burnett is taking Synthroid currently.  ? ?7. Other hyperlipidemia ?Heather Burnett is currently taking atorvastatin.  ? ?8. Vitamin D deficiency ?Heather Burnett is taking over the counter Vitamin D currently.  ? ?9. Other disorder of eating ?Heather Burnett notes some night time eating and some binge behavior.  ? ?Assessment/Plan:  ? ?1. Other fatigue ?Heather Burnett does feel that her weight is causing her energy to be lower than it should be. Fatigue may be related to obesity, depression or many other causes. Labs will be ordered, and in the  meanwhile, Heather Burnett will focus on self care including making healthy food choices, increasing physical activity and focusing on stress reduction. Heather Burnett will gradually increase activities. We will review EKG today.  ? ?- EKG 12-Lead ? ?2. SOB (shortness of breath) on  exertion ?Heather Burnett does feel that she gets out of breath more easily that she used to when she exercises. Heather Burnett's shortness of breath appears to be obesity related and exercise induced. She has agreed to work on weight loss and gradually increase exercise to treat her exercise induced shortness of breath. Will continue to monitor closely.  ? ?3. PCOS (polycystic ovarian syndrome) ?Heather Burnett are first line treatment for this issue. We will check A1C and insulin. We discussed several lifestyle Burnett today and she will continue to work on diet, exercise and weight loss efforts. Orders and follow up as documented in patient record. ? ?Counseling ?PCOS is a leading cause of menstrual irregularities and infertility. It is also associated with obesity, hirsutism (excessive hair growth on the face, chest, or back), and cardiovascular risk factors such as high cholesterol and insulin resistance. ?Insulin resistance appears to play a central role.  ?Women with PCOS have been shown to have impaired appetite-regulating hormones. ?Metformin is one medication that can improve metabolic parameters.  ?Women with polycystic ovary syndrome (PCOS) have an increased risk for cardiovascular disease (CVD) - European Journal of Preventive Cardiology.  ? ?- Insulin, random ?- Hemoglobin A1c ? ?4. Essential hypertension ?[name] will continue taking metoprolol, chlorpheniramine, and spironolactone. We will check CMP today. She is working on healthy weight loss and exercise to improve blood pressure control. We will watch for signs of hypotension as she continues her lifestyle Burnett. ? ?- Comprehensive metabolic panel ? ?5. Type 2 diabetes mellitus with other specified complication, without long-term current use of insulin ([name]) ?Good blood sugar control is important to decrease the likelihood of diabetic complications such as nephropathy, neuropathy, limb loss, blindness, coronary artery disease, and  death. Heather lifestyle modification including diet, exercise and weight loss are the first line of treatment for diabetes. We will check A1C and insulin today.  ? ?- Insulin, random ?- Hemoglobin A1c ? ?6. Other specified hypothyroidism ?We will check thyroid panel today. Orders and follow up as documented in patient record. ? ?Counseling ?Good thyroid control is important for overall health. Supratherapeutic thyroid levels are dangerous and will not improve weight loss results. ?Counseling: The correct way to take levothyroxine is fasting, with water, separated by at least 30 minutes from breakfast, and separated by more than 4 hours from calcium, iron, multivitamins, acid reflux medications (PPIs).   ? ?- TSH+T4F+T3Free ? ?7. Other hyperlipidemia ?Cardiovascular risk and specific lipid/LDL goals reviewed.  We will check lipid panel and CMP today. Louan will continue taking atorvastatin. We discussed several lifestyle Burnett today and Efrat will continue to work on diet, exercise and weight loss efforts. Orders and follow up as documented in patient record.  ? ?Counseling ?Heather Burnett are the first line treatment for this issue. ?Dietary changes: Increase soluble fiber. Decrease simple carbohydrates. ?Exercise changes: Moderate to vigorous-intensity aerobic activity 150 minutes per week if tolerated. ?Lipid-lowering medications: see documented in medical record. ? ?- Lipid Panel With LDL/HDL Ratio ?- Comprehensive metabolic panel ? ?8. Vitamin D deficiency ?Low Vitamin D level contributes to fatigue and are associated with obesity, breast, and colon cancer. We will check Vitamin D today and Jahleah will follow-up for routine testing of Vitamin D, at least 2-3 times per  year to avoid over-replacement. ? ?- VITAMIN D 25 Hydroxy (Vit-D Deficiency, Fractures) ? ?9. Other disorder of eating ?Lanai sees Dr. Mallie Mussel our Bariatric psychologist. Behavior modification techniques were  discussed today to help Ian deal with her emotional/non-hunger eating behaviors.  Orders and follow up as documented in patient record.  ? ?10. Depression screening ?Katherleen had a positive depression scre

## 2022-02-24 ENCOUNTER — Ambulatory Visit (INDEPENDENT_AMBULATORY_CARE_PROVIDER_SITE_OTHER): Payer: No Typology Code available for payment source | Admitting: Bariatrics

## 2022-02-24 ENCOUNTER — Encounter (INDEPENDENT_AMBULATORY_CARE_PROVIDER_SITE_OTHER): Payer: Self-pay | Admitting: Bariatrics

## 2022-02-24 ENCOUNTER — Other Ambulatory Visit (HOSPITAL_COMMUNITY): Payer: Self-pay

## 2022-02-24 VITALS — BP 118/74 | HR 85 | Temp 97.9°F | Ht 62.0 in | Wt 227.0 lb

## 2022-02-24 DIAGNOSIS — E038 Other specified hypothyroidism: Secondary | ICD-10-CM

## 2022-02-24 DIAGNOSIS — E669 Obesity, unspecified: Secondary | ICD-10-CM

## 2022-02-24 DIAGNOSIS — E559 Vitamin D deficiency, unspecified: Secondary | ICD-10-CM | POA: Diagnosis not present

## 2022-02-24 DIAGNOSIS — E1169 Type 2 diabetes mellitus with other specified complication: Secondary | ICD-10-CM | POA: Diagnosis not present

## 2022-02-24 DIAGNOSIS — Z7984 Long term (current) use of oral hypoglycemic drugs: Secondary | ICD-10-CM

## 2022-02-24 DIAGNOSIS — Z6841 Body Mass Index (BMI) 40.0 and over, adult: Secondary | ICD-10-CM

## 2022-02-24 MED ORDER — VITAMIN D (ERGOCALCIFEROL) 1.25 MG (50000 UNIT) PO CAPS
50000.0000 [IU] | ORAL_CAPSULE | ORAL | 0 refills | Status: DC
  Filled 2022-02-24: qty 5, 35d supply, fill #0

## 2022-03-02 NOTE — Progress Notes (Signed)
Chief Complaint:   OBESITY Heather Burnett is here to discuss her progress with her obesity treatment plan along with follow-up of her obesity related diagnoses. Heather Burnett is on the Category 3 Plan and states she is following her eating plan approximately 80% of the time. Heather Burnett states she is walking for 30-40 minutes 3 times per week.  Today's visit was #: 2 Starting weight: 235 lbs Starting date: 02/10/2022 Today's weight: 227 lbs Today's date: 02/24/2022 Total lbs lost to date: 8 lbs Total lbs lost since last in-office visit: 8 lbs  Interim History: Heather Burnett is down 8 lbs since her first visit. She has an abdominal hernia and has some nausea and she is following up with her gastroenterologist.   Subjective:   1. Diabetes mellitus type 2 in obese [facility]) Heather Burnett is taking Rybelsus. Her last A1C was 6.8. Her insulin was 32.3.  2. Other specified hypothyroidism Heather Burnett is currently on Synthroid. Her TSH was slightly low.   3. Vitamin D insufficiency Heather Burnett is taking over the counter Vitamin D. Her last Vitamin D level was 39.9.  Assessment/Plan:   1. Diabetes mellitus type 2 in obese [facility]) We discussed GLP's today. Good blood sugar control is important to decrease the likelihood of diabetic complications such as nephropathy, neuropathy, limb loss, blindness, coronary artery disease, and death. Intensive lifestyle modification including diet, exercise and weight loss are the first line of treatment for diabetes.   2. Other specified hypothyroidism Heather Burnett will continue Synthroid. We will recheck TSH in 6-8 weeks. Orders and follow up as documented in patient record.  Counseling Good thyroid control is important for overall health. Supratherapeutic thyroid levels are dangerous and will not improve weight loss results. Counseling: The correct way to take levothyroxine is fasting, with water, separated by at least 30 minutes from breakfast, and separated by more than 4 hours from calcium,  iron, multivitamins, acid reflux medications (PPIs).    3. Vitamin D insufficiency Low Vitamin D level contributes to fatigue and are associated with obesity, breast, and colon cancer. Heather Burnett agrees to start prescription Vitamin D 50,000 IU every week for 1 month with no refills and she will follow-up for routine testing of Vitamin D, at least 2-3 times per year to avoid over-replacement.  - Vitamin D, Ergocalciferol, (DRISDOL) 1.25 MG (50000 UNIT) CAPS capsule; Take 1 capsule by mouth every 7 days.  Dispense: 5 capsule; Refill: 0  4. Obesity, Current BMI 41.5 Heather Burnett is currently in the action stage of change. As such, her goal is to continue with weight loss efforts. She has agreed to the Category 3 Plan.   Heather Burnett will continue meal planning and she will continue to adhere closely to the plan. We discussed labs from 02/10/2022 CMP, Lipids, Vitamin D, Hgb A1C, insulin, and thyroid panel.   Exercise goals:  As is.   Behavioral modification strategies: increasing lean protein intake, decreasing simple carbohydrates, increasing vegetables, increasing water intake, decreasing eating out, no skipping meals, meal planning and cooking strategies, keeping healthy foods in the home, and planning for success.  Heather Burnett has agreed to follow-up with our clinic in 3 weeks. She was informed of the importance of frequent follow-up visits to maximize her success with intensive lifestyle modifications for her multiple health conditions.   Objective:   Blood pressure 118/74, pulse 85, temperature 97.9 F (36.6 C), height '5\' 2"'$  (1.575 m), weight 227 lb (103 kg), last menstrual period 01/27/2022, SpO2 96 %. Body mass index is 41.52 kg/m.  General: Cooperative,  alert, well developed, in no acute distress. HEENT: Conjunctivae and lids unremarkable. Cardiovascular: Regular rhythm.  Lungs: Normal work of breathing. Neurologic: No focal deficits.   Lab Results  Component Value Date   CREATININE 0.85  02/10/2022   BUN 9 02/10/2022   NA 140 02/10/2022   K 4.5 02/10/2022   CL 100 02/10/2022   CO2 25 02/10/2022   Lab Results  Component Value Date   ALT 21 02/10/2022   AST 21 02/10/2022   ALKPHOS 62 02/10/2022   BILITOT 0.4 02/10/2022   Lab Results  Component Value Date   HGBA1C 6.8 (H) 02/10/2022   HGBA1C 6.8 10/27/2021   HGBA1C 6.1 (H) 04/08/2021   HGBA1C 6.1 (H) 06/17/2020   HGBA1C 6.3 (H) 03/12/2020   Lab Results  Component Value Date   INSULIN 32.3 (H) 02/10/2022   Lab Results  Component Value Date   TSH 0.310 (L) 02/10/2022   Lab Results  Component Value Date   CHOL 147 02/10/2022   HDL 46 02/10/2022   LDLCALC 71 02/10/2022   TRIG 181 (H) 02/10/2022   CHOLHDL 3.8 04/08/2021   Lab Results  Component Value Date   VD25OH 39.9 02/10/2022   VD25OH 45.8 07/11/2015   Lab Results  Component Value Date   WBC 13.5 (H) 09/05/2021   HGB 13.8 09/05/2021   HCT 42.1 09/05/2021   MCV 90.0 09/05/2021   PLT 341 09/05/2021   No results found for: IRON, TIBC, FERRITIN  Attestation Statements:   Reviewed by clinician on day of visit: allergies, medications, problem list, medical history, surgical history, family history, social history, and previous encounter notes.  I, Lizbeth Bark, RMA, am acting as Location manager for [employer], DO.  I have reviewed the above documentation for accuracy and completeness, and I agree with the above. Jearld Lesch, DO

## 2022-03-03 ENCOUNTER — Telehealth (INDEPENDENT_AMBULATORY_CARE_PROVIDER_SITE_OTHER): Payer: No Typology Code available for payment source | Admitting: Psychology

## 2022-03-03 DIAGNOSIS — F5089 Other specified eating disorder: Secondary | ICD-10-CM | POA: Diagnosis not present

## 2022-03-03 DIAGNOSIS — F32A Depression, unspecified: Secondary | ICD-10-CM | POA: Diagnosis not present

## 2022-03-03 DIAGNOSIS — F419 Anxiety disorder, unspecified: Secondary | ICD-10-CM

## 2022-03-10 ENCOUNTER — Other Ambulatory Visit (HOSPITAL_COMMUNITY): Payer: Self-pay

## 2022-03-10 DIAGNOSIS — Z800 Family history of malignant neoplasm of digestive organs: Secondary | ICD-10-CM | POA: Insufficient documentation

## 2022-03-10 DIAGNOSIS — K219 Gastro-esophageal reflux disease without esophagitis: Secondary | ICD-10-CM | POA: Insufficient documentation

## 2022-03-10 DIAGNOSIS — R1114 Bilious vomiting: Secondary | ICD-10-CM | POA: Insufficient documentation

## 2022-03-10 MED ORDER — METOCLOPRAMIDE HCL 10 MG PO TABS
ORAL_TABLET | ORAL | 0 refills | Status: DC
Start: 2022-03-10 — End: 2022-05-06
  Filled 2022-03-10: qty 60, 20d supply, fill #0

## 2022-03-10 MED ORDER — PANTOPRAZOLE SODIUM 40 MG PO TBEC
DELAYED_RELEASE_TABLET | ORAL | 3 refills | Status: DC
  Filled 2022-03-10: qty 60, 30d supply, fill #0
  Filled 2022-05-06: qty 60, 30d supply, fill #1
  Filled 2022-09-02: qty 60, 30d supply, fill #2
  Filled 2022-11-04: qty 60, 30d supply, fill #3

## 2022-03-12 ENCOUNTER — Other Ambulatory Visit (HOSPITAL_COMMUNITY): Payer: Self-pay

## 2022-03-12 NOTE — Telephone Encounter (Signed)
Pt is taking her mediation regularly and has an appt 03/26/2022

## 2022-03-16 ENCOUNTER — Encounter (INDEPENDENT_AMBULATORY_CARE_PROVIDER_SITE_OTHER): Payer: Self-pay | Admitting: Bariatrics

## 2022-03-16 DIAGNOSIS — D352 Benign neoplasm of pituitary gland: Secondary | ICD-10-CM | POA: Insufficient documentation

## 2022-03-16 DIAGNOSIS — E236 Other disorders of pituitary gland: Secondary | ICD-10-CM | POA: Insufficient documentation

## 2022-03-16 DIAGNOSIS — G4730 Sleep apnea, unspecified: Secondary | ICD-10-CM | POA: Insufficient documentation

## 2022-03-16 DIAGNOSIS — D6800 Von Willebrand disease, unspecified: Secondary | ICD-10-CM | POA: Insufficient documentation

## 2022-03-17 NOTE — Progress Notes (Unsigned)
  Office: [phone]  /  Fax: [phone]    Date: 03/31/2022   Appointment Start Time: *** Duration: *** minutes Provider: Glennie Isle, Psy.D. Type of Session: Individual Therapy  Location of Patient: {gbptloc:23249} (private location) Location of Provider: Provider's Home (private office) Type of Contact: Telepsychological Visit via MyChart Video Visit  Session Content:This provider called [name] at 4:03pm as she did not present for today's appointment. A HIPAA compliant voicemail was left requesting a call back.  As such, today's appointment was initiated *** minutes late. Rosalyn is a 51 y.o. female presenting for a follow-up appointment to address the previously established treatment goal of increasing coping skills.Today's appointment was a telepsychological visit due to COVID-19. Malisa provided verbal consent for today's telepsychological appointment and she is aware she is responsible for securing confidentiality on her end of the session. Prior to proceeding with today's appointment, Nikie's physical location at the time of this appointment was obtained as well a phone number she could be reached at in the event of technical difficulties. Corrine and this provider participated in today's telepsychological service.   This provider conducted a brief check-in. *** Correne was receptive to today's appointment as evidenced by openness to sharing, responsiveness to feedback, and {gbreceptiveness:23401}.  Mental Status Examination:  Appearance: {Appearance:22431} Behavior: {Behavior:22445} Mood: {gbmood:21757} Affect: {Affect:22436} Speech: {Speech:22432} Eye Contact: {Eye Contact:22433} Psychomotor Activity: {Motor Activity:22434} Gait: {gbgait:23404} Thought Process: {thought process:22448}  Thought Content/Perception: {disturbances:22451} Orientation: {Orientation:22437} Memory/Concentration: {gbcognition:22449} Insight: {Insight:22446} Judgment:  {Insight:22446}  Interventions:  {Interventions for Progress Notes:23405}  DSM-5 Diagnosis(es):  F50.89 Other Specified Feeding or Eating Disorder, Emotional and Binge Eating Behaviors, F41.9 Unspecified Anxiety Disorder and  F32.A Unspecified Depressive Disorder  Treatment Goal & Progress: During the initial appointment with this provider, the following treatment goal was established: increase coping skills. Kamden has demonstrated progress in her goal as evidenced by {gbtxprogress:22839}. Shakemia also {gbtxprogress2:22951}.  Plan: The next appointment is scheduled for *** at ***, which will be via MyChart Video Visit. The next session will focus on {Plan for Next Appointment:23400}.

## 2022-03-18 ENCOUNTER — Other Ambulatory Visit (HOSPITAL_COMMUNITY): Payer: Self-pay | Admitting: Urgent Care

## 2022-03-18 ENCOUNTER — Other Ambulatory Visit (HOSPITAL_COMMUNITY): Payer: Self-pay

## 2022-03-18 MED ORDER — ONDANSETRON HCL 4 MG PO TABS
ORAL_TABLET | ORAL | 0 refills | Status: DC
  Filled 2022-03-18: qty 20, 7d supply, fill #0

## 2022-03-18 MED ORDER — LEVOCETIRIZINE DIHYDROCHLORIDE 5 MG PO TABS
ORAL_TABLET | ORAL | 11 refills | Status: DC
Start: 2022-02-06 — End: 2023-12-30
  Filled 2022-03-18: qty 30, 30d supply, fill #0
  Filled 2022-05-06: qty 30, 30d supply, fill #1
  Filled 2022-07-10: qty 30, 30d supply, fill #2
  Filled 2022-08-18: qty 30, 30d supply, fill #3
  Filled 2022-09-02 – 2022-10-23 (×3): qty 30, 30d supply, fill #4
  Filled 2022-11-04: qty 30, 30d supply, fill #5
  Filled 2022-11-25: qty 90, 90d supply, fill #5
  Filled 2023-01-27: qty 90, 90d supply, fill #6

## 2022-03-18 NOTE — Telephone Encounter (Signed)
Patient was last seen by me 4-5 months ago. Medication refills are to go to her PCP.

## 2022-03-19 ENCOUNTER — Other Ambulatory Visit (HOSPITAL_COMMUNITY): Payer: Self-pay

## 2022-03-26 ENCOUNTER — Ambulatory Visit (INDEPENDENT_AMBULATORY_CARE_PROVIDER_SITE_OTHER): Payer: No Typology Code available for payment source | Admitting: Adult Health

## 2022-03-26 ENCOUNTER — Encounter (INDEPENDENT_AMBULATORY_CARE_PROVIDER_SITE_OTHER): Payer: Self-pay | Admitting: Adult Health

## 2022-03-26 VITALS — BP 117/75 | HR 82 | Temp 98.1°F | Ht 62.0 in | Wt 219.0 lb

## 2022-03-26 DIAGNOSIS — Z7984 Long term (current) use of oral hypoglycemic drugs: Secondary | ICD-10-CM

## 2022-03-26 DIAGNOSIS — E669 Obesity, unspecified: Secondary | ICD-10-CM

## 2022-03-26 DIAGNOSIS — E1169 Type 2 diabetes mellitus with other specified complication: Secondary | ICD-10-CM

## 2022-03-26 DIAGNOSIS — R1114 Bilious vomiting: Secondary | ICD-10-CM

## 2022-03-26 DIAGNOSIS — Z6841 Body Mass Index (BMI) 40.0 and over, adult: Secondary | ICD-10-CM

## 2022-03-30 NOTE — Progress Notes (Unsigned)
Chief Complaint:   OBESITY Heather Burnett is here to discuss her progress with her obesity treatment plan along with follow-up of her obesity related diagnoses. Heather Burnett is on the Category 3 Plan and states she is following her eating plan approximately 70% of the time. Heather Burnett states she is walking for 30 minutes 4 times per week.  Today's visit was #: 3 Starting weight: 235 lbs Starting date: 02/10/2022 Today's weight: 219 lbs Today's date: 03/26/2022 Total lbs lost to date: 16 Total lbs lost since last in-office visit: 8  Interim History: In February 2023-nausea vomiting started.  May 2023 daily nausea/vomiting, unable to keep solid food down only able to tolerate liquids/soft foods.  She lives with her 100 year old mother-6 to 7 days/week.  Subjective:   1. Bilious vomiting with nausea On 01/29/2022 CT abdominal pelvis with contrast***.  03/11/2019 3 GI notes.  2. Type 2 diabetes mellitus with other specified complication, without long-term current use of insulin ([place]) On 02/10/2022 endocrinologist manages type 2 diabetes/hypothyroidism.  A1c was 6.8.  She is only on Rybelsus 7 mg daily.  Assessment/Plan:   1. Bilious vomiting with nausea Scheduled EGD August 2023-she is on cancellation list.  2. Type 2 diabetes mellitus with other specified complication, without long-term current use of insulin ([place]) Discussed GLP-1 therapy with endocrinologist, Rybelsus versus Ozempic.  3. Obesity, Current BMI 40.2 Heather Burnett is currently in the action stage of change. As such, her goal is to continue with weight loss efforts. She has agreed to [employer].   Exercise goals: As is.  Behavioral modification strategies: increasing lean protein intake, decreasing simple carbohydrates, keeping healthy foods in the home, ways to avoid boredom eating, and planning for success.  Heather Burnett has agreed to follow-up with our clinic in 2 to 3 weeks. She was informed of the importance of frequent follow-up visits to  maximize her success with intensive lifestyle modifications for her multiple health conditions.   Objective:   Blood pressure 117/75, pulse 82, temperature 98.1 F (36.7 C), height '5\' 2"'$  (1.575 m), weight 219 lb (99.3 kg), SpO2 100 %. Body mass index is 40.06 kg/m.  General: Cooperative, alert, well developed, in no acute distress. HEENT: Conjunctivae and lids unremarkable. Cardiovascular: Regular rhythm.  Lungs: Normal work of breathing. Neurologic: No focal deficits.   Lab Results  Component Value Date   CREATININE 0.85 02/10/2022   BUN 9 02/10/2022   NA 140 02/10/2022   K 4.5 02/10/2022   CL 100 02/10/2022   CO2 25 02/10/2022   Lab Results  Component Value Date   ALT 21 02/10/2022   AST 21 02/10/2022   ALKPHOS 62 02/10/2022   BILITOT 0.4 02/10/2022   Lab Results  Component Value Date   HGBA1C 6.8 (H) 02/10/2022   HGBA1C 6.8 10/27/2021   HGBA1C 6.1 (H) 04/08/2021   HGBA1C 6.1 (H) 06/17/2020   HGBA1C 6.3 (H) 03/12/2020   Lab Results  Component Value Date   INSULIN 32.3 (H) 02/10/2022   Lab Results  Component Value Date   TSH 0.310 (L) 02/10/2022   Lab Results  Component Value Date   CHOL 147 02/10/2022   HDL 46 02/10/2022   LDLCALC 71 02/10/2022   TRIG 181 (H) 02/10/2022   CHOLHDL 3.8 04/08/2021   Lab Results  Component Value Date   VD25OH 39.9 02/10/2022   VD25OH 45.8 07/11/2015   Lab Results  Component Value Date   WBC 13.5 (H) 09/05/2021   HGB 13.8 09/05/2021   HCT 42.1 09/05/2021  MCV 90.0 09/05/2021   PLT 341 09/05/2021   No results found for: "IRON", "TIBC", "FERRITIN"  Attestation Statements:   Reviewed by clinician on day of visit: allergies, medications, problem list, medical history, surgical history, family history, social history, and previous encounter notes.   Wilhemena Durie, am acting as transcriptionist for Mina Marble, NP.  I have reviewed the above documentation for accuracy and completeness, and I agree with the  above. -  ***

## 2022-03-31 ENCOUNTER — Telehealth (INDEPENDENT_AMBULATORY_CARE_PROVIDER_SITE_OTHER): Payer: No Typology Code available for payment source | Admitting: Psychology

## 2022-03-31 ENCOUNTER — Encounter (INDEPENDENT_AMBULATORY_CARE_PROVIDER_SITE_OTHER): Payer: Self-pay

## 2022-03-31 ENCOUNTER — Telehealth (INDEPENDENT_AMBULATORY_CARE_PROVIDER_SITE_OTHER): Payer: Self-pay | Admitting: Psychology

## 2022-03-31 NOTE — Telephone Encounter (Signed)
  Office: [phone]  /  Fax: [phone]  Date of Call: March 31, 2022  Time of Call: 4:03pm Provider: Glennie Isle, PsyD  CONTENT: This provider called Heather Burnett to check-in as she did not present for today's MyChart Video Visit appointment at [place]. A HIPAA compliant voicemail was left requesting a call back. Of note, this provider stayed on the MyChart Video Visit appointment for 5 minutes prior to signing off per the clinic's grace period policy.    PLAN: This provider will wait for [name] to call back. No further follow-up planned by this provider.

## 2022-04-01 DIAGNOSIS — E119 Type 2 diabetes mellitus without complications: Secondary | ICD-10-CM | POA: Insufficient documentation

## 2022-04-07 ENCOUNTER — Encounter (INDEPENDENT_AMBULATORY_CARE_PROVIDER_SITE_OTHER): Payer: Self-pay | Admitting: Bariatrics

## 2022-04-07 ENCOUNTER — Ambulatory Visit (INDEPENDENT_AMBULATORY_CARE_PROVIDER_SITE_OTHER): Payer: No Typology Code available for payment source | Admitting: Bariatrics

## 2022-04-07 VITALS — BP 101/66 | HR 86 | Temp 97.8°F | Ht 62.0 in | Wt 218.0 lb

## 2022-04-07 DIAGNOSIS — Z6839 Body mass index (BMI) 39.0-39.9, adult: Secondary | ICD-10-CM

## 2022-04-07 DIAGNOSIS — K219 Gastro-esophageal reflux disease without esophagitis: Secondary | ICD-10-CM

## 2022-04-07 DIAGNOSIS — E038 Other specified hypothyroidism: Secondary | ICD-10-CM

## 2022-04-07 DIAGNOSIS — E669 Obesity, unspecified: Secondary | ICD-10-CM | POA: Diagnosis not present

## 2022-04-08 ENCOUNTER — Other Ambulatory Visit (HOSPITAL_COMMUNITY): Payer: Self-pay

## 2022-04-08 ENCOUNTER — Other Ambulatory Visit (INDEPENDENT_AMBULATORY_CARE_PROVIDER_SITE_OTHER): Payer: Self-pay | Admitting: Bariatrics

## 2022-04-08 DIAGNOSIS — E559 Vitamin D deficiency, unspecified: Secondary | ICD-10-CM

## 2022-04-08 NOTE — Progress Notes (Unsigned)
Chief Complaint:   OBESITY Rylan is here to discuss her progress with her obesity treatment plan along with follow-up of her obesity related diagnoses. Merrianne is on the BRAT/the Category 3 Plan and states she is following her eating plan approximately 50% of the time. Laykin states she is walking for 30 minutes 7 times per week.  Today's visit was #: 4 Starting weight: 235 lbs Starting date: 02/10/2022 Today's weight: 218 lbs Today's date: 04/07/2022 Total lbs lost to date: 17 Total lbs lost since last in-office visit: 1  Interim History: Darlene has had some GI symptoms (hiatal hernia), and she will see GI in August 2023. She thinks that she is getting in her protein.   Subjective:   1. Gastroesophageal reflux disease, unspecified whether esophagitis present Myrtis is taking Protonix and Tums as needed.  2. Other specified hypothyroidism Leolia is taking Synthroid currently.  Assessment/Plan:   1. Gastroesophageal reflux disease, unspecified whether esophagitis present Hayle will follow-up with the GI physician.  2. Other specified hypothyroidism Clista will continue Synthroid as prescribed.  3. Obesity, Current BMI 39.9 Derrika is currently in the action stage of change. As such, her goal is to continue with weight loss efforts. She has agreed to the Category 3 Plan.   Healthy snacks handout was given.  Mindful eating was discussed.  Increase soup with vegetables and soft meat, and protein shakes.  Exercise goals: As is.   Behavioral modification strategies: increasing lean protein intake, decreasing simple carbohydrates, increasing vegetables, increasing water intake, decreasing eating out, no skipping meals, meal planning and cooking strategies, keeping healthy foods in the home, and planning for success.  Suhayla has agreed to follow-up with our clinic in 2 to 3 weeks. She was informed of the importance of frequent follow-up visits to maximize her success  with intensive lifestyle modifications for her multiple health conditions.   Objective:   Blood pressure 101/66, pulse 86, temperature 97.8 F (36.6 C), height '5\' 2"'$  (1.575 m), weight 218 lb (98.9 kg), SpO2 93 %. Body mass index is 39.87 kg/m.  General: Cooperative, alert, well developed, in no acute distress. HEENT: Conjunctivae and lids unremarkable. Cardiovascular: Regular rhythm.  Lungs: Normal work of breathing. Neurologic: No focal deficits.   Lab Results  Component Value Date   CREATININE 0.85 02/10/2022   BUN 9 02/10/2022   NA 140 02/10/2022   K 4.5 02/10/2022   CL 100 02/10/2022   CO2 25 02/10/2022   Lab Results  Component Value Date   ALT 21 02/10/2022   AST 21 02/10/2022   ALKPHOS 62 02/10/2022   BILITOT 0.4 02/10/2022   Lab Results  Component Value Date   HGBA1C 6.8 (H) 02/10/2022   HGBA1C 6.8 10/27/2021   HGBA1C 6.1 (H) 04/08/2021   HGBA1C 6.1 (H) 06/17/2020   HGBA1C 6.3 (H) 03/12/2020   Lab Results  Component Value Date   INSULIN 32.3 (H) 02/10/2022   Lab Results  Component Value Date   TSH 0.310 (L) 02/10/2022   Lab Results  Component Value Date   CHOL 147 02/10/2022   HDL 46 02/10/2022   LDLCALC 71 02/10/2022   TRIG 181 (H) 02/10/2022   CHOLHDL 3.8 04/08/2021   Lab Results  Component Value Date   VD25OH 39.9 02/10/2022   VD25OH 45.8 07/11/2015   Lab Results  Component Value Date   WBC 13.5 (H) 09/05/2021   HGB 13.8 09/05/2021   HCT 42.1 09/05/2021   MCV 90.0 09/05/2021   PLT 341  09/05/2021   No results found for: "IRON", "TIBC", "FERRITIN"  Attestation Statements:   Reviewed by clinician on day of visit: allergies, medications, problem list, medical history, surgical history, family history, social history, and previous encounter notes.   Wilhemena Durie, am acting as Location manager for [employer], DO.  I have reviewed the above documentation for accuracy and completeness, and I agree with the above. -  ***

## 2022-04-09 ENCOUNTER — Other Ambulatory Visit (HOSPITAL_COMMUNITY): Payer: Self-pay

## 2022-04-09 MED ORDER — ATORVASTATIN CALCIUM 40 MG PO TABS
ORAL_TABLET | ORAL | 11 refills | Status: DC
  Filled 2022-04-09: qty 90, 90d supply, fill #0
  Filled 2022-05-12 – 2022-07-25 (×2): qty 90, 90d supply, fill #1
  Filled 2022-10-15 – 2022-10-23 (×2): qty 90, 90d supply, fill #2
  Filled 2023-01-27: qty 90, 90d supply, fill #3

## 2022-04-10 ENCOUNTER — Other Ambulatory Visit (HOSPITAL_COMMUNITY): Payer: Self-pay

## 2022-04-10 ENCOUNTER — Ambulatory Visit
Admission: RE | Admit: 2022-04-10 | Discharge: 2022-04-10 | Disposition: A | Discharge status: Home / Self Care | Payer: No Typology Code available for payment source | Source: Ambulatory Visit | Attending: Internal Medicine | Admitting: Internal Medicine

## 2022-04-10 DIAGNOSIS — Z1231 Encounter for screening mammogram for malignant neoplasm of breast: Secondary | ICD-10-CM | POA: Diagnosis present

## 2022-04-11 ENCOUNTER — Encounter (INDEPENDENT_AMBULATORY_CARE_PROVIDER_SITE_OTHER): Payer: Self-pay | Admitting: Bariatrics

## 2022-04-28 ENCOUNTER — Ambulatory Visit: Payer: No Typology Code available for payment source | Admitting: "Endocrinology

## 2022-04-30 ENCOUNTER — Other Ambulatory Visit: Payer: Self-pay | Admitting: "Endocrinology

## 2022-04-30 DIAGNOSIS — E039 Hypothyroidism, unspecified: Secondary | ICD-10-CM

## 2022-05-01 ENCOUNTER — Other Ambulatory Visit
Admission: RE | Admit: 2022-05-01 | Discharge: 2022-05-01 | Disposition: A | Discharge status: Home / Self Care | Payer: No Typology Code available for payment source | Attending: "Endocrinology | Admitting: "Endocrinology

## 2022-05-01 DIAGNOSIS — E039 Hypothyroidism, unspecified: Secondary | ICD-10-CM | POA: Insufficient documentation

## 2022-05-01 LAB — TSH: TSH: 0.073 u[IU]/mL — ABNORMAL LOW (ref 0.350–4.500)

## 2022-05-01 LAB — T4, FREE: Free T4: 0.86 ng/dL (ref 0.61–1.12)

## 2022-05-04 ENCOUNTER — Other Ambulatory Visit (HOSPITAL_COMMUNITY): Payer: Self-pay

## 2022-05-04 ENCOUNTER — Ambulatory Visit (INDEPENDENT_AMBULATORY_CARE_PROVIDER_SITE_OTHER): Payer: No Typology Code available for payment source | Admitting: "Endocrinology

## 2022-05-04 ENCOUNTER — Encounter: Payer: Self-pay | Admitting: "Endocrinology

## 2022-05-04 ENCOUNTER — Ambulatory Visit (INDEPENDENT_AMBULATORY_CARE_PROVIDER_SITE_OTHER): Payer: No Typology Code available for payment source | Admitting: Bariatrics

## 2022-05-04 ENCOUNTER — Encounter (INDEPENDENT_AMBULATORY_CARE_PROVIDER_SITE_OTHER): Payer: Self-pay | Admitting: Bariatrics

## 2022-05-04 VITALS — BP 116/76 | HR 80 | Ht 62.0 in | Wt 214.2 lb

## 2022-05-04 VITALS — BP 112/75 | HR 90 | Temp 97.7°F | Ht 62.0 in | Wt 211.0 lb

## 2022-05-04 DIAGNOSIS — E669 Obesity, unspecified: Secondary | ICD-10-CM | POA: Diagnosis not present

## 2022-05-04 DIAGNOSIS — E039 Hypothyroidism, unspecified: Secondary | ICD-10-CM

## 2022-05-04 DIAGNOSIS — E559 Vitamin D deficiency, unspecified: Secondary | ICD-10-CM

## 2022-05-04 DIAGNOSIS — Z6839 Body mass index (BMI) 39.0-39.9, adult: Secondary | ICD-10-CM

## 2022-05-04 DIAGNOSIS — Z6838 Body mass index (BMI) 38.0-38.9, adult: Secondary | ICD-10-CM | POA: Diagnosis not present

## 2022-05-04 DIAGNOSIS — E119 Type 2 diabetes mellitus without complications: Secondary | ICD-10-CM | POA: Diagnosis not present

## 2022-05-04 DIAGNOSIS — E7849 Other hyperlipidemia: Secondary | ICD-10-CM | POA: Diagnosis not present

## 2022-05-04 LAB — POCT GLYCOSYLATED HEMOGLOBIN (HGB A1C): HbA1c, POC (controlled diabetic range): 5.8 % (ref 0.0–7.0)

## 2022-05-04 MED ORDER — VITAMIN D (ERGOCALCIFEROL) 1.25 MG (50000 UNIT) PO CAPS
50000.0000 [IU] | ORAL_CAPSULE | ORAL | 0 refills | Status: DC
  Filled 2022-05-04: qty 5, 35d supply, fill #0

## 2022-05-04 MED ORDER — LEVOTHYROXINE SODIUM 50 MCG PO TABS
50.0000 ug | ORAL_TABLET | Freq: Every day | ORAL | 1 refills | Status: DC
  Filled 2022-05-04: qty 90, 90d supply, fill #0

## 2022-05-04 NOTE — Patient Instructions (Signed)

## 2022-05-04 NOTE — Progress Notes (Signed)
05/04/2022, 6:50 PM   Endocrinology follow-up note  Subjective:    Patient ID: Heather Burnett, female    DOB: 1971/08/29, PCP Adin Hector, MD   Past Medical History:  Diagnosis Date   Allergy    Anxiety    Asthma    WELL CONTROLLED   Back pain    Cancer ([place])    melanoma on my chest   Chest pain    Constipation    COPD (chronic obstructive pulmonary disease) ([place])    not sure about COPD    DDD (degenerative disc disease), cervical    and lower back   Depression    Diabetes mellitus, type II ([place])    Diverticulitis    DJD (degenerative joint disease)    Dysrhythmia    Empty sella syndrome ([place])    followed at [facility]   Empty sella syndrome [facility])    Family history of adverse reaction to anesthesia    mother almost died with anesthesia in the past, hard to wake up and got real sick on my stomach vomiting, now she receives local anethesia.   GERD (gastroesophageal reflux disease)    Headache    history of migraine   Heart murmur    Hiatal hernia    History of methicillin resistant staphylococcus aureus (MRSA)    History of palpitations 07/2004   Hyperlipidemia    Hypertension    Hypothyroidism    Hypothyroidism, adult 02/08/2020   Idiopathic peripheral neuropathy    Joint pain    Lactose intolerance    Lymphocytosis    Murmur    Neuropathy 2016   Osteoporosis    Palpitations    PCOS (polycystic ovarian syndrome)    Pituitary microadenoma ([place])    followed at [facility]   Pneumonia 2008   Pre-diabetes    Sleep apnea    USES CPAP   Swallowing difficulty    Tachycardia    Thyroid disease    Vitamin D deficiency    Von Willebrand disease ([place]) 2012   PT STATES SHE HAS NEVER HAD ANY ISSUES WITH BLEEDING   Past Surgical History:  Procedure Laterality Date   ACHILLES TENDON SURGERY Left 12/15/2019   Procedure: ACHILLES TENDON REPAIR PRIMARY;  Surgeon: Samara Deist, DPM;  Location: [facility] ORS;  Service:  Podiatry;  Laterality: Left;   ANTERIOR CERVICAL DECOMP/DISCECTOMY FUSION N/A 10/02/2020   Procedure: ANTERIOR CERVICAL DECOMPRESSION/DISCECTOMY FUSION 1 LEVEL C6-7;  Surgeon: Meade Maw, MD;  Location: [facility] ORS;  Service: Neurosurgery;  Laterality: N/A;   BACK SURGERY     removal of 2 lumbar vertebrae   BREAST BIOPSY Right 2010   NEG   CHOLECYSTECTOMY     COLONOSCOPY  03/03/2013, 02/06/2008   COLONOSCOPY WITH PROPOFOL N/A 08/05/2018   Procedure: COLONOSCOPY WITH PROPOFOL;  Surgeon: Manya Silvas, MD;  Location: [facility] ENDOSCOPY;  Service: Endoscopy;  Laterality: N/A;   EGD  02/06/2008   GASTROC RECESSION EXTREMITY Left 12/15/2019   Procedure: GASTROC RECESSION EXTREMITY;  Surgeon: Samara Deist, DPM;  Location: [facility] ORS;  Service: Podiatry;  Laterality: Left;   HALLUX VALGUS CHEILECTOMY Left 08/21/2016   Procedure: HALLUX VALGUS CHEILECTOMY;  Surgeon: Sharlotte Alamo, DPM;  Location: [facility] ORS;  Service:  Podiatry;  Laterality: Left;   HEMORRHOID SURGERY  10/2014   SPINE SURGERY     TONSILLECTOMY AND ADENOIDECTOMY     Social History   Socioeconomic History   Marital status: Divorced    Spouse name: Not on file   Number of children: Not on file   Years of education: Not on file   Highest education level: Not on file  Occupational History   Occupation: CMA [employer]  Tobacco Use   Smoking status: Former    Years: 8.00    Types: Cigarettes    Quit date: 04/09/1994    Years since quitting: 28.0   Smokeless tobacco: Never  Vaping Use   Vaping Use: Never used  Substance and Sexual Activity   Alcohol use: Yes    Comment: social   Drug use: No   Sexual activity: Not on file  Other Topics Concern   Not on file  Social History Narrative   Divorced since 2009.Lives with 49 year old mum.CMA at [employer].   Social Determinants of Health   Financial Resource Strain: Not on file  Food Insecurity: Not on file  Transportation Needs: Not on file  Physical Activity: Not on file   Stress: Not on file  Social Connections: Not on file   Family History  Problem Relation Age of Onset   Thyroid disease Mother    Osteoporosis Mother    Diabetes Father    Hyperlipidemia Father    Hypertension Father    Cancer Father    Heart attack Father    Sudden Cardiac Death Father    Obesity Father    Breast cancer Sister 5   Breast cancer Maternal Grandmother 23   Breast cancer Maternal Aunt 79   Outpatient Encounter Medications as of 05/04/2022  Medication Sig   levothyroxine (SYNTHROID) 50 MCG tablet Take 1 tablet by mouth daily.   acetaminophen (TYLENOL) 500 MG tablet Take 500 mg by mouth every 6 (six) hours as needed for mild pain or headache.    albuterol (VENTOLIN HFA) 108 (90 Base) MCG/ACT inhaler Inhale 2 inhalations into the lungs every 6 (six) hours as needed for Wheezing   atorvastatin (LIPITOR) 40 MG tablet Take 1 tablet (40 mg total) by mouth once daily   cetirizine (ZYRTEC) 10 MG tablet Take 10 mg by mouth daily as needed (seasonal allergies.).    clonazePAM (KLONOPIN) 0.5 MG tablet Take 0.5 mg by mouth daily as needed for anxiety.   doxycycline (VIBRAMYCIN) 100 MG capsule Take 1 capsule (100 mg total) by mouth 2 (two) times daily.   DULoxetine (CYMBALTA) 60 MG capsule Take 1 capsule by mouth daily at  8:00am   fluticasone (FLOVENT HFA) 110 MCG/ACT inhaler Inhale 1 puff by mouth 2 (two) times daily   Fluticasone-Salmeterol (ADVAIR) 250-50 MCG/DOSE AEPB Inhale 1 puff into the lungs every 12 (twelve) hours as needed (seasonal allergies).    furosemide (LASIX) 40 MG tablet Take 1 tablet (40 mg total) by mouth once daily as needed for Edema   glucose blood (FREESTYLE LITE) test strip use to test blood glucose once daily as directed   hydrOXYzine (VISTARIL) 25 MG capsule Take 1 capsule by mouth twice daily as needed   Lactase (LACTAID PO) Take by mouth.   lamoTRIgine (LAMICTAL) 200 MG tablet Take 1 tablet by mouth daily   lamoTRIgine (LAMICTAL) 25 MG tablet Take 2  tablets by mouth nightly   levalbuterol (XOPENEX HFA) 45 MCG/ACT inhaler Inhale 2 puffs into the lungs every 6 (  six) hours as needed for wheezing   levocetirizine (XYZAL) 5 MG tablet Take 1 tablet by mouth in the evening.   metoCLOPramide (REGLAN) 10 MG tablet Take 1 tablet (10 mg total) by mouth 3 (three) times daily as needed for up to 60 doses   metoprolol succinate (TOPROL XL) 50 MG 24 hr tablet Take 1 tablet (50 mg total) by mouth 2 (two) times daily. Take with or immediately following a meal. (Patient not taking: Reported on 05/04/2022)   metoprolol tartrate (LOPRESSOR) 50 MG tablet Take 1 tablet (50 mg total) by mouth 2 (two) times daily (Patient not taking: Reported on 05/04/2022)   pantoprazole (PROTONIX) 40 MG tablet Take 1 tablet (40 mg total) by mouth once daily   pantoprazole (PROTONIX) 40 MG tablet Take 1 tablet (40 mg total) by mouth 2 (two) times daily, 30 mins before breakfast and 30 mins before dinner. (Patient taking differently: Take by mouth 3 (three) times daily.)   progesterone (PROMETRIUM) 200 MG capsule Take 2 capsules (400 mg total) by mouth at bedtime.   spironolactone (ALDACTONE) 100 MG tablet Take 1 tablet (100 mg total) by mouth once daily.   tiZANidine (ZANAFLEX) 4 MG tablet Take 1 tablet (4 mg total) by mouth 3 (three) times daily as needed   triamcinolone cream (KENALOG) 0.1 % Apply topically 2 (two) times daily as needed.   Vitamin D, Ergocalciferol, (DRISDOL) 1.25 MG (50000 UNIT) CAPS capsule Take 1 capsule by mouth every 7 days.   [DISCONTINUED] atorvastatin (LIPITOR) 40 MG tablet Take 1 tablet (40 mg total) by mouth once daily   [DISCONTINUED] Semaglutide (RYBELSUS) 7 MG TABS Take 1 tablet by mouth daily.   [DISCONTINUED] SYNTHROID 75 MCG tablet Take 1 tablet (75 mcg total) by mouth daily before breakfast.   No facility-administered encounter medications on file as of 05/04/2022.   ALLERGIES: Allergies  Allergen Reactions   Liraglutide -Weight Management Other  (See Comments)    GI upset   Polysorbate    Sorbitan Other (See Comments)    GI upset   Adhesive [Tape] Itching and Rash    Paper tape or coban is easier on the skin.   Latex Rash   Penicillins Rash    Updated on 09/26/2020 following APP conversation with patient.  Swelling of the face/tongue/throat, SOB, or low BP? NO Sudden or severe rash/hives, skin peeling, or any reaction on the inside of your mouth or nose? NO - low severity rash to body.  Seek medical attention at a hospital or doctor's office? Yes When did it last happen? Age 73 following "a shot of PCN".       If all above answers are "NO", may proceed with cephalosporin use. - Has taken cefdinir and cephalexin without issues.      VACCINATION STATUS: Immunization History  Administered Date(s) Administered   Influenza Split 08/02/2015, 07/19/2018   Influenza,inj,Quad PF,6+ Mos 07/22/2017   Influenza-Unspecified 06/19/2020   Tdap 11/12/2010, 04/08/2021    HPI Heather Burnett is 51 y.o. female who presents today for follow-up after she was seen in consultation for hypothyroidism, type 2 diabetes.   See notes from previous visit.  She was diagnosed with hypothyroidism at approximate age of 65 years.  She was treated with various dose of thyroid hormone formulations.  Due to problem stabilizing her thyroid function tests, her NP thyroid was switched to levothyroxine during her last visit.  She is currently on levothyroxine 75 mcg p.o. daily before breakfast.  She reports good consistency.  Her previsit labs are consistent with slight over-replacement.    She has no new complaints.  Her previous symptoms of iatrogenic thyrotoxicosis have largely resolved.  She denies any exposure to thyroid iodine ablation nor thyroidectomy.   she denies dysphagia, shortness of breath nor voice change.  She has family history of various types of thyroid dysfunction, however no thyroid malignancy.  She did not have any thyroid imaging.  She has  well-controlled type 2 diabetes with point-of-care A1c of 5.8%, improving from 6.8%.  She is on Rybelsus 7 mg p.o. daily.  She has GI symptoms including nausea, vomiting, bloating, diarrhea.      Review of Systems  Constitutional:  + Fluctuating body weight, + fatigue, + subjective hyperthermia, no subjective hypothermia   Objective:       05/04/2022    3:58 PM 05/04/2022    6:00 AM 04/07/2022    7:00 AM  Vitals with BMI  Height '5\' 2"'$  '5\' 2"'$  '5\' 2"'$   Weight 214 lbs 3 oz 211 lbs 218 lbs  BMI 39.17 73.42 87.68  Systolic 115 726 203  Diastolic 76 75 66  Pulse 80 90 86    BP 116/76   Pulse 80   Ht '5\' 2"'$  (1.575 m)   Wt 214 lb 3.2 oz (97.2 kg)   BMI 39.18 kg/m   Wt Readings from Last 3 Encounters:  05/04/22 214 lb 3.2 oz (97.2 kg)  05/04/22 211 lb (95.7 kg)  04/07/22 218 lb (98.9 kg)    Physical Exam  Constitutional:  Body mass index is 39.18 kg/m.,  not in acute distress, normal state of mind Eyes: PERRLA, EOMI, no exophthalmos ENT: moist mucous membranes, no gross thyromegaly, no gross cervical lymphadenopathy   CMP ( most recent) CMP     Component Value Date/Time   NA 140 02/10/2022 0847   K 4.5 02/10/2022 0847   CL 100 02/10/2022 0847   CO2 25 02/10/2022 0847   GLUCOSE 124 (H) 02/10/2022 0847   GLUCOSE 102 (H) 09/05/2021 1318   BUN 9 02/10/2022 0847   CREATININE 0.85 02/10/2022 0847   CREATININE 0.79 04/08/2021 1659   CALCIUM 9.8 02/10/2022 0847   PROT 6.6 02/10/2022 0847   ALBUMIN 4.3 02/10/2022 0847   AST 21 02/10/2022 0847   ALT 21 02/10/2022 0847   ALKPHOS 62 02/10/2022 0847   BILITOT 0.4 02/10/2022 0847   GFRNONAA >60 09/05/2021 1318   GFRNONAA 89 04/08/2021 1659   GFRAA 103 04/08/2021 1659     Diabetic Labs (most recent): Lab Results  Component Value Date   HGBA1C 5.8 05/04/2022   HGBA1C 6.8 (H) 02/10/2022   HGBA1C 6.8 10/27/2021   MICROALBUR <0.2 01/06/2021   MICROALBUR 20.3 (H) 07/11/2015     Lipid Panel ( most recent) Lipid Panel      Component Value Date/Time   CHOL 147 02/10/2022 0847   TRIG 181 (H) 02/10/2022 0847   HDL 46 02/10/2022 0847   CHOLHDL 3.8 04/08/2021 1659   VLDL 33 07/11/2015 0755   LDLCALC 71 02/10/2022 0847   LDLCALC 95 04/08/2021 1659   LABVLDL 30 02/10/2022 0847      Lab Results  Component Value Date   TSH 0.073 (L) 05/01/2022   TSH 0.310 (L) 02/10/2022   TSH 0.741 10/22/2021   TSH <0.01 (L) 04/08/2021   TSH 1.137 07/21/2018   TSH 0.944 07/11/2015   TSH 1.191 04/30/2015   FREET4 0.86 05/01/2022   FREET4 1.37 02/10/2022   FREET4 0.83 10/22/2021  Labs from outside facilities: May 08, 2021 TSH less than 0.01, November 12, 2020 TSH less than 0.01, January 11, 2020 TSH 0.759, July 14, 2019 TSH less than 0.01.  Assessment & Plan:   1. Hypothyroidism, unspecified type 2.  Type 2 diabetes, controlled with A1c of 5.8%.  Her previsit thyroid function tests are consistent with slight over-replacement.  I discussed and lowered her levothyroxine to 50 mcg p.o. daily before breakfast.   - We discussed about the correct intake of her thyroid hormone, on empty stomach at fasting, with water, separated by at least 30 minutes from breakfast and other medications,  and separated by more than 4 hours from calcium, iron, multivitamins, acid reflux medications (PPIs). -Patient is made aware of the fact that thyroid hormone replacement is needed for life, dose to be adjusted by periodic monitoring of thyroid function tests. Her baseline thyroid ultrasound is unremarkable.  Regarding her type 2 diabetes with point-of-care A1c of 5.8% improving from 6.8%.  She presents with 21 pounds of weight loss.  In light of the fact that she is not tolerating Rybelsus, she is advised to finish her current supplies and discontinue.  She will have point-of-care A1c during her next visit and will decide for necessity of medications.    In the meantime, in light of her concerns with weight management as well as metabolic  syndrome, patient will continue to benefit from lifestyle medicine as detailed below. - she acknowledges that there is a room for improvement in her food and drink choices. - Suggestion is made for her to avoid simple carbohydrates  from her diet including Cakes, Sweet Desserts, Ice Cream, Soda (diet and regular), Sweet Tea, Candies, Chips, Cookies, Store Bought Juices, Alcohol , Artificial Sweeteners,  Coffee Creamer, and "Sugar-free" Products, Lemonade. This will help patient to have more stable blood glucose profile and potentially avoid unintended weight gain.  The following Lifestyle Medicine recommendations according to [facility]) were discussed and and offered to patient and she  agrees to start the journey:  A. Whole Foods, Plant-Based Nutrition comprising of fruits and vegetables, plant-based proteins, whole-grain carbohydrates was discussed in detail with the patient.   A list for source of those nutrients were also provided to the patient.  Patient will use only water or unsweetened tea for hydration. B.  The need to stay away from risky substances including alcohol, smoking; obtaining 7 to 9 hours of restorative sleep, at least 150 minutes of moderate intensity exercise weekly, the importance of healthy social connections,  and stress management techniques were discussed. C.  A full color page of  Calorie density of various food groups per pound showing examples of each food groups was provided to the patient.  She is advised to maintain close follow-up with her PMD.    I spent 31 minutes in the care of the patient today including review of labs from Thyroid Function, CMP, and other relevant labs ; imaging/biopsy records (current and previous including abstractions from other facilities); face-to-face time discussing  her lab results and symptoms, medications doses, her options of short and long term treatment based on the latest standards of care /  guidelines;   and documenting the encounter.  Heather Burnett  participated in the discussions, expressed understanding, and voiced agreement with the above plans.  All questions were answered to her satisfaction. she is encouraged to contact clinic should she have any questions or concerns prior to her return visit.  Follow up plan: Return in about 3 months (around 08/04/2022) for F/U with Pre-visit Labs, A1c -NV.   I spent 31 minutes in the care of the patient today including review of labs from Thyroid Function, CMP, and other relevant labs ; imaging/biopsy records (current and previous including abstractions from other facilities); face-to-face time discussing  her lab results and symptoms, medications doses, her options of short and long term treatment based on the latest standards of care / guidelines;   and documenting the encounter.  Heather Burnett  participated in the discussions, expressed understanding, and voiced agreement with the above plans.  All questions were answered to her satisfaction. she is encouraged to contact clinic should she have any questions or concerns prior to her return visit.  Glade Lloyd, MD [facility] [address] [place], [place] [address] Phone: [phone]  Fax: [phone]     05/04/2022, 6:50 PM  This note was partially dictated with voice recognition software. Similar sounding words can be transcribed inadequately or may not  be corrected upon review.

## 2022-05-05 NOTE — Progress Notes (Signed)
Chief Complaint:   OBESITY Heather Burnett is here to discuss her progress with her obesity treatment plan along with follow-up of her obesity related diagnoses. Heather Burnett is on the Category 3 Plan and states she is following her eating plan approximately 80% of the time. Heather Burnett states she is walking for 30-45 minutes 3 times per week.  Today's visit was #: 5 Starting weight: 235 lbs Starting date: 02/10/2022 Today's weight: 211 lbs Today's date: 05/04/2022 Total lbs lost to date: 24 Total lbs lost since last in-office visit: 7  Interim History: Heather Burnett is down an additional 7 pounds.  She has some GI issues and she has an endoscopy scheduled.  Subjective:   1. Vitamin D deficiency Heather Burnett is taking vitamin D prescription.  2. Other hyperlipidemia Heather Burnett is taking Lipitor currently.  Assessment/Plan:   1. Vitamin D deficiency Heather Burnett will continue prescription vitamin D 50,000 units once weekly, and we will refill for 1 month.  - Vitamin D, Ergocalciferol, (DRISDOL) 1.25 MG (50000 UNIT) CAPS capsule; Take 1 capsule by mouth every 7 days.  Dispense: 5 capsule; Refill: 0  2. Other hyperlipidemia Heather Burnett will continue Lipitor, and will continue to work on diet, exercise and weight loss efforts. Orders and follow up as documented in patient record.   3. Obesity, Current BMI 38.6 Heather Burnett is currently in the action stage of change. As such, her goal is to continue with weight loss efforts. She has agreed to the Category 3 Plan.   Meal planning was discussed.  Heather Burnett is to keep her water intake high.  We will recheck fasting labs at her next visit.  Exercise goals: As is.   Behavioral modification strategies: increasing lean protein intake, decreasing simple carbohydrates, increasing vegetables, increasing water intake, no skipping meals, meal planning and cooking strategies, keeping healthy foods in the home, ways to avoid boredom eating, and planning for success.  Heather Burnett has  agreed to follow-up with our clinic in 4 weeks. She was informed of the importance of frequent follow-up visits to maximize her success with intensive lifestyle modifications for her multiple health conditions.   Objective:   Blood pressure 112/75, pulse 90, temperature 97.7 F (36.5 C), height '5\' 2"'$  (1.575 m), weight 211 lb (95.7 kg), SpO2 96 %. Body mass index is 38.59 kg/m.  General: Cooperative, alert, well developed, in no acute distress. HEENT: Conjunctivae and lids unremarkable. Cardiovascular: Regular rhythm.  Lungs: Normal work of breathing. Neurologic: No focal deficits.   Lab Results  Component Value Date   CREATININE 0.85 02/10/2022   BUN 9 02/10/2022   NA 140 02/10/2022   K 4.5 02/10/2022   CL 100 02/10/2022   CO2 25 02/10/2022   Lab Results  Component Value Date   ALT 21 02/10/2022   AST 21 02/10/2022   ALKPHOS 62 02/10/2022   BILITOT 0.4 02/10/2022   Lab Results  Component Value Date   HGBA1C 5.8 05/04/2022   HGBA1C 6.8 (H) 02/10/2022   HGBA1C 6.8 10/27/2021   HGBA1C 6.1 (H) 04/08/2021   HGBA1C 6.1 (H) 06/17/2020   Lab Results  Component Value Date   INSULIN 32.3 (H) 02/10/2022   Lab Results  Component Value Date   TSH 0.073 (L) 05/01/2022   Lab Results  Component Value Date   CHOL 147 02/10/2022   HDL 46 02/10/2022   LDLCALC 71 02/10/2022   TRIG 181 (H) 02/10/2022   CHOLHDL 3.8 04/08/2021   Lab Results  Component Value Date   VD25OH 39.9 02/10/2022  VD25OH 45.8 07/11/2015   Lab Results  Component Value Date   WBC 13.5 (H) 09/05/2021   HGB 13.8 09/05/2021   HCT 42.1 09/05/2021   MCV 90.0 09/05/2021   PLT 341 09/05/2021   No results found for: "IRON", "TIBC", "FERRITIN"  Attestation Statements:   Reviewed by clinician on day of visit: allergies, medications, problem list, medical history, surgical history, family history, social history, and previous encounter notes.   Wilhemena Durie, am acting as Location manager for [employer], DO.  I have reviewed the above documentation for accuracy and completeness, and I agree with the above. Jearld Lesch, DO

## 2022-05-06 ENCOUNTER — Other Ambulatory Visit (HOSPITAL_COMMUNITY): Payer: Self-pay

## 2022-05-06 MED ORDER — METOCLOPRAMIDE HCL 10 MG PO TABS
ORAL_TABLET | ORAL | 0 refills | Status: DC
  Filled 2022-05-06: qty 60, 20d supply, fill #0

## 2022-05-07 ENCOUNTER — Encounter (INDEPENDENT_AMBULATORY_CARE_PROVIDER_SITE_OTHER): Payer: Self-pay | Admitting: Bariatrics

## 2022-05-07 ENCOUNTER — Other Ambulatory Visit (HOSPITAL_COMMUNITY): Payer: Self-pay

## 2022-05-08 ENCOUNTER — Other Ambulatory Visit (HOSPITAL_COMMUNITY): Payer: Self-pay

## 2022-05-08 MED ORDER — HYDROXYZINE PAMOATE 25 MG PO CAPS
25.0000 mg | ORAL_CAPSULE | Freq: Two times a day (BID) | ORAL | 0 refills | Status: DC | PRN
  Filled 2022-05-08 – 2022-09-02 (×3): qty 180, 90d supply, fill #0

## 2022-05-08 MED ORDER — DULOXETINE HCL 60 MG PO CPEP
ORAL_CAPSULE | ORAL | 0 refills | Status: DC
  Filled 2022-05-08 – 2022-05-12 (×2): qty 90, 90d supply, fill #0

## 2022-05-08 MED ORDER — LAMOTRIGINE 200 MG PO TABS
ORAL_TABLET | Freq: Every day | ORAL | 0 refills | Status: DC
  Filled 2022-05-08 – 2022-05-12 (×2): qty 90, 90d supply, fill #0

## 2022-05-12 ENCOUNTER — Other Ambulatory Visit (HOSPITAL_COMMUNITY): Payer: Self-pay

## 2022-05-13 ENCOUNTER — Other Ambulatory Visit (HOSPITAL_COMMUNITY): Payer: Self-pay

## 2022-05-14 ENCOUNTER — Other Ambulatory Visit (HOSPITAL_COMMUNITY): Payer: Self-pay

## 2022-05-20 ENCOUNTER — Other Ambulatory Visit (HOSPITAL_COMMUNITY): Payer: Self-pay

## 2022-05-25 ENCOUNTER — Other Ambulatory Visit (HOSPITAL_COMMUNITY): Payer: Self-pay

## 2022-05-26 ENCOUNTER — Other Ambulatory Visit (HOSPITAL_COMMUNITY): Payer: Self-pay

## 2022-05-26 MED ORDER — NA SULFATE-K SULFATE-MG SULF 17.5-3.13-1.6 GM/177ML PO SOLN
ORAL | 0 refills | Status: DC
  Filled 2022-05-26: qty 354, 1d supply, fill #0

## 2022-05-27 ENCOUNTER — Encounter (INDEPENDENT_AMBULATORY_CARE_PROVIDER_SITE_OTHER): Payer: Self-pay

## 2022-06-02 ENCOUNTER — Encounter (INDEPENDENT_AMBULATORY_CARE_PROVIDER_SITE_OTHER): Payer: Self-pay | Admitting: Nurse Practitioner

## 2022-06-02 ENCOUNTER — Ambulatory Visit (INDEPENDENT_AMBULATORY_CARE_PROVIDER_SITE_OTHER): Payer: No Typology Code available for payment source | Admitting: Nurse Practitioner

## 2022-06-02 ENCOUNTER — Other Ambulatory Visit (HOSPITAL_COMMUNITY): Payer: Self-pay

## 2022-06-02 VITALS — BP 113/78 | HR 96 | Temp 98.2°F | Ht 62.0 in | Wt 216.0 lb

## 2022-06-02 DIAGNOSIS — K5900 Constipation, unspecified: Secondary | ICD-10-CM | POA: Diagnosis not present

## 2022-06-02 DIAGNOSIS — E1169 Type 2 diabetes mellitus with other specified complication: Secondary | ICD-10-CM | POA: Diagnosis not present

## 2022-06-02 DIAGNOSIS — Z6839 Body mass index (BMI) 39.0-39.9, adult: Secondary | ICD-10-CM | POA: Diagnosis not present

## 2022-06-02 DIAGNOSIS — E669 Obesity, unspecified: Secondary | ICD-10-CM

## 2022-06-02 DIAGNOSIS — Z7984 Long term (current) use of oral hypoglycemic drugs: Secondary | ICD-10-CM

## 2022-06-02 MED ORDER — DOCUSATE SODIUM 100 MG PO CAPS
100.0000 mg | ORAL_CAPSULE | Freq: Two times a day (BID) | ORAL | 0 refills | Status: DC | PRN
  Filled 2022-06-02: qty 20, 10d supply, fill #0

## 2022-06-05 ENCOUNTER — Other Ambulatory Visit (HOSPITAL_COMMUNITY): Payer: Self-pay

## 2022-06-05 MED ORDER — DULOXETINE HCL 60 MG PO CPEP
60.0000 mg | ORAL_CAPSULE | Freq: Every day | ORAL | 0 refills | Status: DC
  Filled 2022-06-05 – 2022-09-02 (×2): qty 90, 90d supply, fill #0

## 2022-06-05 MED ORDER — LAMOTRIGINE 200 MG PO TABS
200.0000 mg | ORAL_TABLET | Freq: Every day | ORAL | 0 refills | Status: DC
  Filled 2022-06-05: qty 90, 90d supply, fill #0

## 2022-06-05 MED ORDER — HYDROXYZINE PAMOATE 25 MG PO CAPS
25.0000 mg | ORAL_CAPSULE | Freq: Two times a day (BID) | ORAL | 0 refills | Status: DC | PRN
  Filled 2022-06-05: qty 180, 90d supply, fill #0
  Filled 2022-09-02: qty 180, fill #0

## 2022-06-05 MED ORDER — LAMOTRIGINE 25 MG PO TABS
50.0000 mg | ORAL_TABLET | Freq: Every evening | ORAL | 0 refills | Status: DC
  Filled 2022-06-05: qty 180, 90d supply, fill #0

## 2022-06-10 ENCOUNTER — Ambulatory Visit: Payer: No Typology Code available for payment source | Admitting: Anesthesiology

## 2022-06-10 ENCOUNTER — Ambulatory Visit
Admission: RE | Admit: 2022-06-10 | Discharge: 2022-06-10 | Disposition: A | Discharge status: Home / Self Care | Payer: No Typology Code available for payment source | Attending: Internal Medicine | Admitting: Internal Medicine

## 2022-06-10 ENCOUNTER — Encounter
Admission: RE | Disposition: A | Discharge status: Home / Self Care | Payer: Self-pay | Source: Home / Self Care | Attending: Internal Medicine

## 2022-06-10 ENCOUNTER — Encounter: Payer: Self-pay | Admitting: Internal Medicine

## 2022-06-10 DIAGNOSIS — K2970 Gastritis, unspecified, without bleeding: Secondary | ICD-10-CM | POA: Insufficient documentation

## 2022-06-10 DIAGNOSIS — E1140 Type 2 diabetes mellitus with diabetic neuropathy, unspecified: Secondary | ICD-10-CM | POA: Insufficient documentation

## 2022-06-10 DIAGNOSIS — R112 Nausea with vomiting, unspecified: Secondary | ICD-10-CM | POA: Diagnosis not present

## 2022-06-10 DIAGNOSIS — Z87891 Personal history of nicotine dependence: Secondary | ICD-10-CM | POA: Insufficient documentation

## 2022-06-10 DIAGNOSIS — K2100 Gastro-esophageal reflux disease with esophagitis, without bleeding: Secondary | ICD-10-CM | POA: Diagnosis present

## 2022-06-10 DIAGNOSIS — Z79890 Hormone replacement therapy (postmenopausal): Secondary | ICD-10-CM | POA: Diagnosis not present

## 2022-06-10 DIAGNOSIS — Z7951 Long term (current) use of inhaled steroids: Secondary | ICD-10-CM | POA: Diagnosis not present

## 2022-06-10 DIAGNOSIS — G4730 Sleep apnea, unspecified: Secondary | ICD-10-CM | POA: Diagnosis not present

## 2022-06-10 DIAGNOSIS — E785 Hyperlipidemia, unspecified: Secondary | ICD-10-CM | POA: Diagnosis not present

## 2022-06-10 DIAGNOSIS — Z8614 Personal history of Methicillin resistant Staphylococcus aureus infection: Secondary | ICD-10-CM | POA: Insufficient documentation

## 2022-06-10 DIAGNOSIS — K449 Diaphragmatic hernia without obstruction or gangrene: Secondary | ICD-10-CM | POA: Insufficient documentation

## 2022-06-10 DIAGNOSIS — I10 Essential (primary) hypertension: Secondary | ICD-10-CM | POA: Insufficient documentation

## 2022-06-10 DIAGNOSIS — Z79899 Other long term (current) drug therapy: Secondary | ICD-10-CM | POA: Insufficient documentation

## 2022-06-10 DIAGNOSIS — F418 Other specified anxiety disorders: Secondary | ICD-10-CM | POA: Insufficient documentation

## 2022-06-10 DIAGNOSIS — E039 Hypothyroidism, unspecified: Secondary | ICD-10-CM | POA: Diagnosis not present

## 2022-06-10 DIAGNOSIS — J449 Chronic obstructive pulmonary disease, unspecified: Secondary | ICD-10-CM | POA: Insufficient documentation

## 2022-06-10 DIAGNOSIS — M1990 Unspecified osteoarthritis, unspecified site: Secondary | ICD-10-CM | POA: Diagnosis not present

## 2022-06-10 DIAGNOSIS — K219 Gastro-esophageal reflux disease without esophagitis: Secondary | ICD-10-CM | POA: Insufficient documentation

## 2022-06-10 DIAGNOSIS — Z800 Family history of malignant neoplasm of digestive organs: Secondary | ICD-10-CM | POA: Diagnosis not present

## 2022-06-10 HISTORY — PX: ESOPHAGOGASTRODUODENOSCOPY: SHX5428

## 2022-06-10 LAB — POCT PREGNANCY, URINE: Preg Test, Ur: NEGATIVE

## 2022-06-10 SURGERY — EGD (ESOPHAGOGASTRODUODENOSCOPY)
Anesthesia: General

## 2022-06-10 MED ORDER — SODIUM CHLORIDE 0.9 % IV SOLN
INTRAVENOUS | Status: DC
Start: 2022-06-10 — End: 2022-06-10

## 2022-06-10 MED ORDER — PROPOFOL 500 MG/50ML IV EMUL
INTRAVENOUS | Status: DC | PRN
  Administered 2022-06-10: 200 ug/kg/min via INTRAVENOUS

## 2022-06-10 MED ORDER — PROPOFOL 10 MG/ML IV BOLUS
INTRAVENOUS | Status: DC | PRN
  Administered 2022-06-10: 70 mg via INTRAVENOUS
  Administered 2022-06-10: 30 mg via INTRAVENOUS

## 2022-06-10 NOTE — Anesthesia Procedure Notes (Signed)
Date/Time: 06/10/2022 9:27 AM  Performed by: Nelda Marseille, CRNAPre-anesthesia Checklist: Patient identified, Emergency Drugs available, Suction available, Patient being monitored and Timeout performed Oxygen Delivery Method: Simple face mask

## 2022-06-10 NOTE — Progress Notes (Unsigned)
Chief Complaint:   OBESITY Heather Burnett is here to discuss her progress with her obesity treatment plan along with follow-up of her obesity related diagnoses. Heather Burnett is on the Category 3 Plan and states she is following her eating plan approximately 80% of the time. Heather Burnett states she is walking 45 minutes 3 times per week.  Today's visit was #: 6 Starting weight: 235 lbs Starting date: 02/10/2022 Today's weight: 216 lbs Today's date: 06/02/2022 Total lbs lost to date: 19 lbs Total lbs lost since last in-office visit: 0  Interim History: Heather Burnett is struggling with emesis while eating and started getting worse in May. She is scheduled for EDG 06/10/22. She is also struggling with constipation and is scheduled for a colonoscopy 08/2022. Trying to follow plan the best she can. Eating protein shakes, yogurt, applesauce, smoothies and oatmeal. Drinking water, Vita water and 1 soda daily.  Subjective:   1. Constipation, unspecified constipation type Heather Burnett has tried multiple over the counter medications. She has not had a BM in 3 days. She is scheduled for a colonoscopy on 08/26/22. She has struggled for years with constipation but has gotten worse this past week.   2. Type 2 diabetes mellitus with other specified complication, without long-term current use of insulin (HCC) Heather Burnett has taken Metformin and Rebelsus in the past for DMT2. Was stopped by Endo after her last visit. Fasting blood sugars are 96-120's. Feels dizzy when blood sugar < 100. She will drink OJ and will feel better. Her last A1c was 5.0 on 11/04/21. She is on a statin but not an ACE/ARB. Scheduled for eye exam: Oct 12th.  Assessment/Plan:   1. Constipation, unspecified constipation type Keep appointment with GI. We will refill Colace 100 mg twice a day as needed for 1 month with 0 refills.  Refill docusate sodium (COLACE) 100 MG capsule; Take 1 capsule (100 mg total) by mouth 2 (two) times daily as needed for mild  constipation.  Dispense: 20 capsule; Refill: 0  2. Type 2 diabetes mellitus with other specified complication, without long-term current use of insulin (HCC) Heather Burnett will continue to follow up with Endo.  3. Obesity, Current BMI 39.6 Heather Burnett is currently in the action stage of change. As such, her goal is to continue with weight loss efforts. She has agreed to the Category 3 Plan and practicing portion control and making smarter food choices, such as increasing vegetables and decreasing simple carbohydrates.   Exercise goals: As is.  Heather Burnett has an appointment with PCP in Oct for follow up and labs.  Behavioral modification strategies: increasing lean protein intake, increasing water intake, and planning for success.  Heather Burnett has agreed to follow-up with our clinic in 4 weeks. She was informed of the importance of frequent follow-up visits to maximize her success with intensive lifestyle modifications for her multiple health conditions.   Objective:   Blood pressure 113/78, pulse 96, temperature 98.2 F (36.8 C), height '5\' 2"'$  (1.575 m), weight 216 lb (98 kg), SpO2 97 %. Body mass index is 39.51 kg/m.  General: Cooperative, alert, well developed, in no acute distress. HEENT: Conjunctivae and lids unremarkable. Cardiovascular: Regular rhythm.  Lungs: Normal work of breathing. Neurologic: No focal deficits.   Lab Results  Component Value Date   CREATININE 0.85 02/10/2022   BUN 9 02/10/2022   NA 140 02/10/2022   K 4.5 02/10/2022   CL 100 02/10/2022   CO2 25 02/10/2022   Lab Results  Component Value Date   ALT 21  02/10/2022   AST 21 02/10/2022   ALKPHOS 62 02/10/2022   BILITOT 0.4 02/10/2022   Lab Results  Component Value Date   HGBA1C 5.8 05/04/2022   HGBA1C 6.8 (H) 02/10/2022   HGBA1C 6.8 10/27/2021   HGBA1C 6.1 (H) 04/08/2021   HGBA1C 6.1 (H) 06/17/2020   Lab Results  Component Value Date   INSULIN 32.3 (H) 02/10/2022   Lab Results  Component Value Date   TSH  0.073 (L) 05/01/2022   Lab Results  Component Value Date   CHOL 147 02/10/2022   HDL 46 02/10/2022   LDLCALC 71 02/10/2022   TRIG 181 (H) 02/10/2022   CHOLHDL 3.8 04/08/2021   Lab Results  Component Value Date   VD25OH 39.9 02/10/2022   VD25OH 45.8 07/11/2015   Lab Results  Component Value Date   WBC 13.5 (H) 09/05/2021   HGB 13.8 09/05/2021   HCT 42.1 09/05/2021   MCV 90.0 09/05/2021   PLT 341 09/05/2021   No results found for: "IRON", "TIBC", "FERRITIN"  Attestation Statements:   Reviewed by clinician on day of visit: allergies, medications, problem list, medical history, surgical history, family history, social history, and previous encounter notes.  I, Brendell Tyus, RMA, am acting as transcriptionist for Everardo Pacific, FNP.  I have reviewed the above documentation for accuracy and completeness, and I agree with the above. Everardo Pacific, FNP

## 2022-06-10 NOTE — Anesthesia Preprocedure Evaluation (Signed)
Anesthesia Evaluation  Patient identified by MRN, date of birth, ID band Patient awake    Reviewed: Allergy & Precautions, H&P , NPO status , Patient's Chart, lab work & pertinent test results, reviewed documented beta blocker date and time   History of Anesthesia Complications Negative for: history of anesthetic complications  Airway Mallampati: III  TM Distance: >3 FB Neck ROM: full    Dental  (+) Teeth Intact   Pulmonary asthma , sleep apnea and Continuous Positive Airway Pressure Ventilation , pneumonia, COPD, former smoker,    Pulmonary exam normal        Cardiovascular Exercise Tolerance: Good hypertension, On Medications Normal cardiovascular exam+ dysrhythmias + Valvular Problems/Murmurs  Rhythm:regular Rate:Normal     Neuro/Psych  Headaches, PSYCHIATRIC DISORDERS Anxiety Depression    GI/Hepatic Neg liver ROS, GERD  Medicated and Controlled,  Endo/Other  negative endocrine ROSdiabetesHypothyroidism   Renal/GU negative Renal ROS  negative genitourinary   Musculoskeletal  (+) Arthritis , Osteoarthritis,    Abdominal   Peds  Hematology negative hematology ROS (+)   Anesthesia Other Findings Past Medical History: No date: Allergy No date: Anxiety No date: Asthma     Comment:  WELL CONTROLLED No date: Cancer ([place])     Comment:  melanoma on my chest No date: COPD (chronic obstructive pulmonary disease) (HCC) No date: DDD (degenerative disc disease), cervical     Comment:  and lower back No date: Depression No date: DJD (degenerative joint disease) No date: Dysrhythmia No date: Empty sella syndrome (HCC)     Comment:  follow[facility] No date: Family history of adverse reaction to anesthesia     Comment:  mother almost died with anesthesia in the past, hard to               wake up and got real sick on my stomach vomiting, now she              receives local anethesia. No date: GERD (gastroesophageal  reflux disease) No date: Headache     Comment:  history of migraine No date: Heart murmur No date: History of methicillin resistant staphylococcus aureus (MRSA) 07/2004: History of palpitations No date: Hyperlipidemia No date: Hypertension No date: Hypothyroidism No date: Lymphocytosis 2016: Neuropathy No date: PCOS (polycystic ovarian syndrome) No date: Pituitary microadenoma [facility])     Comment:  followed at [facility] 2008: Pneumonia No date: Pre-diabetes No date: Sleep apnea     Comment:  USES CPAP No date: Thyroid disease 2012: Von Willebrand disease ([place])     Comment:  PT STATES SHE HAS NEVER HAD ANY ISSUES WITH BLEEDING Past Surgical History: No date: BACK SURGERY     Comment:  removal of 2 lumbar vertebrae 2010: BREAST BIOPSY; Right     Comment:  NEG No date: CHOLECYSTECTOMY 03/03/2013, 02/06/2008: COLONOSCOPY 08/05/2018: COLONOSCOPY WITH PROPOFOL; N/A     Comment:  Procedure: COLONOSCOPY WITH PROPOFOL;  Surgeon: Manya Silvas, MD;  Location: [facility] ENDOSCOPY;  Service:               Endoscopy;  Laterality: N/A; 02/06/2008: EGD 08/21/2016: HALLUX VALGUS CHEILECTOMY; Left     Comment:  Procedure: HALLUX VALGUS CHEILECTOMY;  Surgeon: Sharlotte Alamo, DPM;  Location: [facility] ORS;  Service: Podiatry;  Laterality: Left; 10/2014: HEMORRHOID SURGERY No date: SPINE SURGERY No date: TONSILLECTOMY AND ADENOIDECTOMY   Reproductive/Obstetrics negative OB ROS                             Anesthesia Physical  Anesthesia Plan  ASA: 3  Anesthesia Plan: General   Post-op Pain Management: Minimal or no pain anticipated   Induction: Intravenous  PONV Risk Score and Plan: 3 and Propofol infusion, TIVA and Ondansetron  Airway Management Planned: Natural Airway  Additional Equipment: None  Intra-op Plan:   Post-operative Plan: Extubation in OR  Informed Consent: I have reviewed the patients History and Physical, chart,  labs and discussed the procedure including the risks, benefits and alternatives for the proposed anesthesia with the patient or authorized representative who has indicated his/her understanding and acceptance.     Dental advisory given  Plan Discussed with: CRNA  Anesthesia Plan Comments: (Discussed risks of anesthesia with patient, including possibility of difficulty with spontaneous ventilation under anesthesia necessitating airway intervention, PONV, and rare risks such as cardiac or respiratory or neurological events, and allergic reactions. Discussed the role of CRNA in patient's perioperative care. Patient understands.)        Anesthesia Quick Evaluation

## 2022-06-10 NOTE — H&P (Signed)
Outpatient short stay form Pre-procedure 06/10/2022 9:20 AM Heather Burnett K. Heather Burnett, M.D.  Primary Physician: Heather Burnett, M.D.  Reason for visit:  bilious vomiting, nausea, GERD  History of present illness:  Heather Burnett presents today for complaints of rectal bleeding and change in bowel habits. Patient has exacerbation of constipation over the last few months since changing her diet to help avoid nausea and vomiting. Patient says she "quit eating meat" which is led to more constipation, although she understands that eating meat usually causes more constipation, not less. She has taken Dulcolax with minimal relief. She is taking correct all female laxative with some moderate relief. She has experienced 2 days of rectal bleeding a few weeks ago which is now resolved. She has had a colonoscopy in the past showing diverticulosis and internal hemorrhoids in 2019. Her brother has colon cancer in remission. She just found out her brother also has a skin cancer which was diagnosed and is being staged at this time. Patient complains of diffuse abdominal bloating and discomfort somewhat relieved with having a bowel movement. She continues to have problems with constipation that has not completely relieved. In regards to her initial symptoms in May 2023 of nausea vomiting, this is improved although not completely resolved that she still has to take Heather Burnett about once a week. No dysphagia.     Current Facility-Administered Medications:    0.9 %  sodium chloride infusion, , Intravenous, Continuous, Heather Burnett, Heather Pike, MD, Last Rate: 20 mL/hr at 06/10/22 0806, New Bag at 06/10/22 0806  Medications Prior to Admission  Medication Sig Dispense Refill Last Dose   atorvastatin (LIPITOR) 40 MG tablet Take 1 tablet (40 mg total) by mouth once daily 30 tablet 11 06/08/2022   cetirizine (ZYRTEC) 10 MG tablet Take 10 mg by mouth daily as needed (seasonal allergies.).    06/08/2022   DULoxetine (CYMBALTA) 60 MG capsule Take  1 capsule by mouth daily at  8:00am 90 capsule 0 06/08/2022   fluticasone (FLOVENT HFA) 110 MCG/ACT inhaler Inhale 1 puff by mouth 2 (two) times daily 12 g 11 06/08/2022   levothyroxine (SYNTHROID) 50 MCG tablet Take 1 tablet by mouth daily. 90 tablet 1 06/09/2022   acetaminophen (TYLENOL) 500 MG tablet Take 500 mg by mouth every 6 (six) hours as needed for mild pain or headache.       albuterol (VENTOLIN HFA) 108 (90 Base) MCG/ACT inhaler Inhale 2 inhalations into the lungs every 6 (six) hours as needed for Wheezing 18 g 11    clonazePAM (KLONOPIN) 0.5 MG tablet Take 0.5 mg by mouth daily as needed for anxiety.      docusate sodium (COLACE) 100 MG capsule Take 1 capsule (100 mg total) by mouth 2 (two) times daily as needed for mild constipation. 20 capsule 0    doxycycline (VIBRAMYCIN) 100 MG capsule Take 1 capsule (100 mg total) by mouth 2 (two) times daily. 14 capsule 0    DULoxetine (CYMBALTA) 60 MG capsule Take 1 capsule by mouth daily at  8:00am 90 capsule 0    DULoxetine (CYMBALTA) 60 MG capsule Take 1 capsule by mouth daily at  8:00am 90 capsule 0    Fluticasone-Salmeterol (ADVAIR) 250-50 MCG/DOSE AEPB Inhale 1 puff into the lungs every 12 (twelve) hours as needed (seasonal allergies).       furosemide (LASIX) 40 MG tablet Take 1 tablet (40 mg total) by mouth once daily as needed for Edema 30 tablet 5    glucose blood (FREESTYLE LITE) test  strip use to test blood glucose once daily as directed 100 each 0    hydrOXYzine (VISTARIL) 25 MG capsule Take 1 capsule by mouth twice daily as needed 180 capsule 0    hydrOXYzine (VISTARIL) 25 MG capsule Take 1 capsule twice daily as needed 180 capsule 0    hydrOXYzine (VISTARIL) 25 MG capsule Take 1 capsule by mouth twice daily as needed 180 capsule 0    Lactase (LACTAID PO) Take by mouth.      lamoTRIgine (LAMICTAL) 200 MG tablet Take 1 tablet by mouth daily. 90 tablet 0 06/08/2022   lamoTRIgine (LAMICTAL) 200 MG tablet Take 1 tablet by mouth daily. 90  tablet 0    lamoTRIgine (LAMICTAL) 25 MG tablet Take 2 tablets by mouth nightly 180 tablet 0    lamoTRIgine (LAMICTAL) 25 MG tablet Take 2 tablets by mouth every evening. 180 tablet 0    levalbuterol (XOPENEX HFA) 45 MCG/ACT inhaler Inhale 2 puffs into the lungs every 6 (six) hours as needed for wheezing 15 g 12    levocetirizine (XYZAL) 5 MG tablet Take 1 tablet by mouth in the evening. 30 tablet 11 06/08/2022   metoCLOPramide (Heather Burnett) 10 MG tablet Take 1 tablet (10 mg total) by mouth 3 (three) times daily as needed 60 tablet 0    Na Sulfate-K Sulfate-Mg Sulf 17.5-3.13-1.6 GM/177ML SOLN Take 1 Bottle by mouth as directed One kit contains 2 bottles.  Take both bottles at the times instructed by your provider. 354 mL 0    pantoprazole (PROTONIX) 40 MG tablet Take 1 tablet (40 mg total) by mouth once daily 30 tablet 11    pantoprazole (PROTONIX) 40 MG tablet Take 1 tablet (40 mg total) by mouth 2 (two) times daily, 30 mins before breakfast and 30 mins before dinner. (Patient taking differently: Take by mouth 3 (three) times daily.) 60 tablet 3    progesterone (PROMETRIUM) 200 MG capsule Take 2 capsules (400 mg total) by mouth at bedtime. 180 capsule 3    spironolactone (ALDACTONE) 100 MG tablet Take 1 tablet (100 mg total) by mouth once daily. 90 tablet 3 06/08/2022   tiZANidine (ZANAFLEX) 4 MG tablet Take 1 tablet (4 mg total) by mouth 3 (three) times daily as needed 20 tablet 1    triamcinolone cream (KENALOG) 0.1 % Apply topically 2 (two) times daily as needed.      Vitamin D, Ergocalciferol, (DRISDOL) 1.25 MG (50000 UNIT) CAPS capsule Take 1 capsule by mouth every 7 days. 5 capsule 0      Allergies  Allergen Reactions   Liraglutide -Weight Management Other (See Comments)    GI upset   Polysorbate    Sorbitan Other (See Comments)    GI upset   Adhesive [Tape] Itching and Rash    Paper tape or coban is easier on the skin.   Latex Rash   Penicillins Rash    Updated on 09/26/2020 following  APP conversation with patient.  Swelling of the face/tongue/throat, SOB, or low BP? NO Sudden or severe rash/hives, skin peeling, or any reaction on the inside of your mouth or nose? NO - low severity rash to body.  Seek medical attention at a hospital or doctor's office? Yes When did it last happen? Age 69 following "a shot of PCN".       If all above answers are "NO", may proceed with cephalosporin use. - Has taken cefdinir and cephalexin without issues.       Past Medical History:  Diagnosis Date  Allergy    Anxiety    Asthma    WELL CONTROLLED   Back pain    Cancer ([place])    melanoma on my chest   Chest pain    Constipation    COPD (chronic obstructive pulmonary disease) (HCC)    not sure about COPD    DDD (degenerative disc disease), cervical    and lower back   Depression    Diabetes mellitus, type II (HCC)    Diverticulitis    DJD (degenerative joint disease)    Dysrhythmia    Empty sella syndrome ([place])    followed at [facility]   Empty sella syndrome [facility])    Family history of adverse reaction to anesthesia    mother almost died with anesthesia in the past, hard to wake up and got real sick on my stomach vomiting, now she receives local anethesia.   GERD (gastroesophageal reflux disease)    Headache    history of migraine   Heart murmur    Hiatal hernia    History of methicillin resistant staphylococcus aureus (MRSA)    History of palpitations 07/2004   Hyperlipidemia    Hypertension    Hypothyroidism    Hypothyroidism, adult 02/08/2020   Idiopathic peripheral neuropathy    Joint pain    Lactose intolerance    Lymphocytosis    Murmur    Neuropathy 2016   Osteoporosis    Palpitations    PCOS (polycystic ovarian syndrome)    Pituitary microadenoma ([place])    followed at [facility]   Pneumonia 2008   Pre-diabetes    Sleep apnea    USES CPAP   Swallowing difficulty    Tachycardia    Thyroid disease    Vitamin D deficiency    Von Willebrand disease ([place]) 2012   PT  STATES SHE HAS NEVER HAD ANY ISSUES WITH BLEEDING    Review of systems:  Otherwise negative.    Physical Exam  Gen: Alert, oriented. Appears stated age.  HEENT: [place]/AT. PERRLA. Lungs: CTA, no wheezes. CV: RR nl S1, S2. Abd: soft, benign, no masses. BS+ Ext: No edema. Pulses 2+    Planned procedures: Proceed with EGD and colonoscopy. The patient understands the nature of the planned procedure, indications, risks, alternatives and potential complications including but not limited to bleeding, infection, perforation, damage to internal organs and possible oversedation/side effects from anesthesia. The patient agrees and gives consent to proceed.  Please refer to procedure notes for findings, recommendations and patient disposition/instructions.     Braidon Chermak K. Heather Burnett, M.D. Gastroenterology 06/10/2022  9:20 AM

## 2022-06-10 NOTE — Interval H&P Note (Signed)
History and Physical Interval Note:  06/10/2022 9:21 AM  Heather Burnett  has presented today for surgery, with the diagnosis of Bilious vomiting with nausea (R11.14) Gastroesophageal reflux disease, unspecified whether esophagitis present (K21.9) Epigastric pain (R10.13).  The various methods of treatment have been discussed with the patient and family. After consideration of risks, benefits and other options for treatment, the patient has consented to  Procedure(s): ESOPHAGOGASTRODUODENOSCOPY (EGD) (N/A) as a surgical intervention.  The patient's history has been reviewed, patient examined, no change in status, stable for surgery.  I have reviewed the patient's chart and labs.  Questions were answered to the patient's satisfaction.     [place], [place]

## 2022-06-10 NOTE — Anesthesia Postprocedure Evaluation (Signed)
Anesthesia Post Note  Patient: Heather Burnett  Procedure(s) Performed: ESOPHAGOGASTRODUODENOSCOPY (EGD)  Patient location during evaluation: Endoscopy Anesthesia Type: General Level of consciousness: awake and alert Pain management: pain level controlled Vital Signs Assessment: post-procedure vital signs reviewed and stable Respiratory status: spontaneous breathing, nonlabored ventilation, respiratory function stable and patient connected to nasal cannula oxygen Cardiovascular status: blood pressure returned to baseline and stable Postop Assessment: no apparent nausea or vomiting Anesthetic complications: no   No notable events documented.   Last Vitals:  Vitals:   06/10/22 0945 06/10/22 0955  BP: (!) 140/60 (!) 140/79  Pulse: 74 75  Resp: 20 17  Temp:    SpO2: 100% 100%    Last Pain:  Vitals:   06/10/22 0955  TempSrc:   PainSc: 0-No pain                 Arita Miss

## 2022-06-10 NOTE — Transfer of Care (Signed)
Immediate Anesthesia Transfer of Care Note  Patient: Heather Burnett  Procedure(s) Performed: ESOPHAGOGASTRODUODENOSCOPY (EGD)  Patient Location: PACU  Anesthesia Type:General  Level of Consciousness: awake, alert  and oriented  Airway & Oxygen Therapy: Patient Spontanous Breathing  Post-op Assessment: Report given to RN and Post -op Vital signs reviewed and stable  Post vital signs: Reviewed and stable  Last Vitals:  Vitals Value Taken Time  BP 131/87 06/10/22 0935  Temp 36.1 C 06/10/22 0935  Pulse 95 06/10/22 0935  Resp 17 06/10/22 0935  SpO2 100 % 06/10/22 0935    Last Pain:  Vitals:   06/10/22 0935  TempSrc: Temporal  PainSc: 0-No pain         Complications: No notable events documented.

## 2022-06-10 NOTE — Op Note (Signed)
[facility] Patient Name: Heather Burnett Procedure Date: 06/10/2022 9:22 AM MRN: [id number] Account #: [id number] Date of Birth: [birth year] Admit Type: Outpatient Age: 51 Room: [facility] ENDO ROOM 2 Gender: Female Note Status: Finalized Instrument Name: Altamese Cabal Endoscope [id number] Procedure:             Upper GI endoscopy Indications:           Gastro-esophageal reflux disease, Nausea with vomiting Providers:             Benay Pike. Brogen Duell MD, MD Medicines:             Propofol per Anesthesia Complications:         No immediate complications. Estimated blood loss: None. Procedure:             Pre-Anesthesia Assessment:                        - The risks and benefits of the procedure and the                         sedation options and risks were discussed with the                         patient. All questions were answered and informed                         consent was obtained.                        - Patient identification and proposed procedure were                         verified prior to the procedure by the nurse. The                         procedure was verified in the procedure room.                        - ASA Grade Assessment: III - A patient with severe                         systemic disease.                        - After reviewing the risks and benefits, the patient                         was deemed in satisfactory condition to undergo the                         procedure.                        After obtaining informed consent, the endoscope was                         passed under direct vision. Throughout the procedure,                         the patient's blood pressure, pulse, and oxygen  saturations were monitored continuously. The Endoscope                         was introduced through the mouth, and advanced to the                         third part of duodenum. The upper GI endoscopy was                          accomplished without difficulty. The patient tolerated                         the procedure well. Findings:      The esophagus was normal.      Localized mild inflammation characterized by erythema was found in the       gastric antrum. One biopsy was obtained with cold forceps for       Helicobacter pylori testing in the gastric body, as well as one biopsy       in the gastric antrum.      A 1 cm hiatal hernia was present.      The examined duodenum was normal. Impression:            - Normal esophagus.                        - Gastritis.                        - 1 cm hiatal hernia.                        - Normal examined duodenum.                        - Biopsies performed in the gastric body and in the                         gastric antrum. Recommendation:        - Patient has a contact number available for                         emergencies. The signs and symptoms of potential                         delayed complications were discussed with the patient.                         Return to normal activities tomorrow. Written                         discharge instructions were provided to the patient.                        - Resume previous diet.                        - Continue present medications.                        - Await pathology results.                        -  Return to my office at the next available                         appointment.                        - Telephone GI office to schedule appointment.                        - The findings and recommendations were discussed with                         the patient. Procedure Code(s):     --- Professional ---                        [phone], Esophagogastroduodenoscopy, flexible,                         transoral; with biopsy, single or multiple Diagnosis Code(s):     --- Professional ---                        R11.2, Nausea with vomiting, unspecified                        K21.9, Gastro-esophageal  reflux disease without                         esophagitis                        K44.9, Diaphragmatic hernia without obstruction or                         gangrene                        K29.70, Gastritis, unspecified, without bleeding CPT copyright 2019 [employer]. All rights reserved. The codes documented in this report are preliminary and upon coder review may  be revised to meet current compliance requirements. Efrain Sella MD, MD 06/10/2022 9:35:10 AM This report has been signed electronically. Number of Addenda: 0 Note Initiated On: 06/10/2022 9:22 AM Estimated Blood Loss:  Estimated blood loss: none.      [facility]

## 2022-06-11 ENCOUNTER — Encounter: Payer: Self-pay | Admitting: Internal Medicine

## 2022-06-11 LAB — SURGICAL PATHOLOGY

## 2022-06-25 ENCOUNTER — Other Ambulatory Visit (HOSPITAL_COMMUNITY): Payer: Self-pay

## 2022-06-25 MED ORDER — FUROSEMIDE 40 MG PO TABS
40.0000 mg | ORAL_TABLET | Freq: Every day | ORAL | 5 refills | Status: DC | PRN
  Filled 2022-06-25: qty 30, 30d supply, fill #0
  Filled 2022-09-03: qty 30, 30d supply, fill #1
  Filled 2022-11-04: qty 30, 30d supply, fill #2
  Filled 2023-01-27: qty 30, 30d supply, fill #3
  Filled 2023-03-10: qty 30, 30d supply, fill #4
  Filled 2023-04-26: qty 30, 30d supply, fill #5

## 2022-06-29 ENCOUNTER — Ambulatory Visit (INDEPENDENT_AMBULATORY_CARE_PROVIDER_SITE_OTHER): Payer: No Typology Code available for payment source | Admitting: Nurse Practitioner

## 2022-06-29 ENCOUNTER — Ambulatory Visit (INDEPENDENT_AMBULATORY_CARE_PROVIDER_SITE_OTHER): Payer: No Typology Code available for payment source | Admitting: Family Medicine

## 2022-07-01 ENCOUNTER — Other Ambulatory Visit (HOSPITAL_COMMUNITY): Payer: Self-pay

## 2022-07-10 ENCOUNTER — Other Ambulatory Visit (HOSPITAL_COMMUNITY): Payer: Self-pay

## 2022-07-14 ENCOUNTER — Other Ambulatory Visit (HOSPITAL_COMMUNITY): Payer: Self-pay

## 2022-07-14 ENCOUNTER — Other Ambulatory Visit: Payer: Self-pay

## 2022-07-14 MED ORDER — PREDNISONE 10 MG PO TABS
ORAL_TABLET | ORAL | 0 refills | Status: DC
Start: 2022-07-14 — End: 2022-09-25
  Filled 2022-07-14: qty 20, 8d supply, fill #0

## 2022-07-14 MED ORDER — DOXYCYCLINE HYCLATE 100 MG PO CAPS
100.0000 mg | ORAL_CAPSULE | Freq: Two times a day (BID) | ORAL | 0 refills | Status: DC
  Filled 2022-07-14: qty 14, 7d supply, fill #0

## 2022-07-14 MED ORDER — TRANEXAMIC ACID 650 MG PO TABS
1300.0000 mg | ORAL_TABLET | Freq: Three times a day (TID) | ORAL | 3 refills | Status: DC
Start: 2022-07-14 — End: 2023-02-04
  Filled 2022-07-14: qty 30, 5d supply, fill #0

## 2022-07-15 ENCOUNTER — Other Ambulatory Visit (HOSPITAL_COMMUNITY): Payer: Self-pay

## 2022-07-25 ENCOUNTER — Other Ambulatory Visit (HOSPITAL_COMMUNITY): Payer: Self-pay

## 2022-07-27 ENCOUNTER — Ambulatory Visit (INDEPENDENT_AMBULATORY_CARE_PROVIDER_SITE_OTHER): Payer: No Typology Code available for payment source | Admitting: Adult Health

## 2022-07-27 ENCOUNTER — Encounter (INDEPENDENT_AMBULATORY_CARE_PROVIDER_SITE_OTHER): Payer: Self-pay | Admitting: Adult Health

## 2022-07-27 VITALS — BP 120/82 | HR 89 | Temp 97.8°F | Ht 62.0 in | Wt 218.0 lb

## 2022-07-27 DIAGNOSIS — Z6839 Body mass index (BMI) 39.0-39.9, adult: Secondary | ICD-10-CM | POA: Diagnosis not present

## 2022-07-27 DIAGNOSIS — E1169 Type 2 diabetes mellitus with other specified complication: Secondary | ICD-10-CM | POA: Diagnosis not present

## 2022-07-27 DIAGNOSIS — Z7985 Long-term (current) use of injectable non-insulin antidiabetic drugs: Secondary | ICD-10-CM

## 2022-07-27 DIAGNOSIS — E669 Obesity, unspecified: Secondary | ICD-10-CM | POA: Diagnosis not present

## 2022-07-27 DIAGNOSIS — Z7984 Long term (current) use of oral hypoglycemic drugs: Secondary | ICD-10-CM

## 2022-08-03 ENCOUNTER — Other Ambulatory Visit
Admission: RE | Admit: 2022-08-03 | Discharge: 2022-08-03 | Disposition: A | Discharge status: Home / Self Care | Payer: No Typology Code available for payment source | Source: Ambulatory Visit | Attending: "Endocrinology | Admitting: "Endocrinology

## 2022-08-03 DIAGNOSIS — E039 Hypothyroidism, unspecified: Secondary | ICD-10-CM | POA: Insufficient documentation

## 2022-08-03 DIAGNOSIS — E119 Type 2 diabetes mellitus without complications: Secondary | ICD-10-CM | POA: Insufficient documentation

## 2022-08-03 LAB — TSH: TSH: 1.155 u[IU]/mL (ref 0.350–4.500)

## 2022-08-03 LAB — T4, FREE: Free T4: 0.62 ng/dL (ref 0.61–1.12)

## 2022-08-03 NOTE — Progress Notes (Unsigned)
Chief Complaint:   OBESITY Heather Burnett is here to discuss her progress with her obesity treatment plan along with follow-up of her obesity related diagnoses. Heather Burnett is on the Category 3 Plan and states she is following her eating plan approximately 40% of the time. Heather Burnett states she is walking 30-45 minutes 3 times per week.  Today's visit was #: 7 Starting weight: 235 lbs Starting date: 02/10/2022 Today's weight: 218 lbs Today's date: 07/27/2022 Total lbs lost to date: 17 lbs Total lbs lost since last in-office visit: +2 lbs  Interim History: Continues to experience GI upset (nausea, vomiting, abdominal pain, hematochezia).  She is able to consume water, oatmeal, green beans, broccoli, red skin potatoes, soup.  Meat protein causes profound abdominal cramping.    Subjective:   1. Type 2 diabetes mellitus with other specified complication, without long-term current use of insulin ([place]) 05/04/2022, Endocrinology discontinued Rybelsus.  Since stopping oral, GLP-1 therapy, she has appreciated reduction of nausea and vomiting.  She has not been routinely checking blood glucose at home.   Assessment/Plan:   1. Type 2 diabetes mellitus with other specified complication, without long-term current use of insulin ([facility]) ***  2. Obesity, Current BMI 39.9 EGD completed 06/10/2022, continue Zofran, Protonix.  Colonoscopy scheduled for 08/26/2022.  Anushka is currently in the action stage of change. As such, her goal is to continue with weight loss efforts. She has agreed to the Category 3 Plan.   Exercise goals:  As is.  Behavioral modification strategies: increasing lean protein intake, decreasing simple carbohydrates, meal planning and cooking strategies, keeping healthy foods in the home, and planning for success.  Heather Burnett has agreed to follow-up with our clinic in 5 weeks. She was informed of the importance of frequent follow-up visits to maximize her success with intensive lifestyle  modifications for her multiple health conditions.   Objective:   Blood pressure 120/82, pulse 89, temperature 97.8 F (36.6 C), height '5\' 2"'$  (1.575 m), weight 218 lb (98.9 kg), SpO2 95 %. Body mass index is 39.87 kg/m.  General: Cooperative, alert, well developed, in no acute distress. HEENT: Conjunctivae and lids unremarkable. Cardiovascular: Regular rhythm.  Lungs: Normal work of breathing. Neurologic: No focal deficits.   Lab Results  Component Value Date   CREATININE 0.85 02/10/2022   BUN 9 02/10/2022   NA 140 02/10/2022   K 4.5 02/10/2022   CL 100 02/10/2022   CO2 25 02/10/2022   Lab Results  Component Value Date   ALT 21 02/10/2022   AST 21 02/10/2022   ALKPHOS 62 02/10/2022   BILITOT 0.4 02/10/2022   Lab Results  Component Value Date   HGBA1C 5.8 05/04/2022   HGBA1C 6.8 (H) 02/10/2022   HGBA1C 6.8 10/27/2021   HGBA1C 6.1 (H) 04/08/2021   HGBA1C 6.1 (H) 06/17/2020   Lab Results  Component Value Date   INSULIN 32.3 (H) 02/10/2022   Lab Results  Component Value Date   TSH 1.155 08/03/2022   Lab Results  Component Value Date   CHOL 147 02/10/2022   HDL 46 02/10/2022   LDLCALC 71 02/10/2022   TRIG 181 (H) 02/10/2022   CHOLHDL 3.8 04/08/2021   Lab Results  Component Value Date   VD25OH 39.9 02/10/2022   VD25OH 45.8 07/11/2015   Lab Results  Component Value Date   WBC 13.5 (H) 09/05/2021   HGB 13.8 09/05/2021   HCT 42.1 09/05/2021   MCV 90.0 09/05/2021   PLT 341 09/05/2021   No results found  for: "IRON", "TIBC", "FERRITIN"  Attestation Statements:   Reviewed by clinician on day of visit: allergies, medications, problem list, medical history, surgical history, family history, social history, and previous encounter notes.  I, Davy Pique, RMA, am acting as Location manager for Mina Marble, NP.  I have reviewed the above documentation for accuracy and completeness, and I agree with the above. -  ***

## 2022-08-04 ENCOUNTER — Other Ambulatory Visit: Payer: Self-pay

## 2022-08-04 ENCOUNTER — Other Ambulatory Visit (HOSPITAL_COMMUNITY): Payer: Self-pay

## 2022-08-04 LAB — THYROGLOBULIN ANTIBODY: Thyroglobulin Antibody: <1 IU/mL (ref 0.0–0.9)

## 2022-08-05 ENCOUNTER — Other Ambulatory Visit (HOSPITAL_COMMUNITY): Payer: Self-pay

## 2022-08-05 ENCOUNTER — Ambulatory Visit (INDEPENDENT_AMBULATORY_CARE_PROVIDER_SITE_OTHER): Payer: No Typology Code available for payment source | Admitting: "Endocrinology

## 2022-08-05 ENCOUNTER — Encounter: Payer: Self-pay | Admitting: "Endocrinology

## 2022-08-05 VITALS — BP 124/88 | HR 88 | Ht 62.0 in | Wt 228.6 lb

## 2022-08-05 DIAGNOSIS — E119 Type 2 diabetes mellitus without complications: Secondary | ICD-10-CM

## 2022-08-05 DIAGNOSIS — E039 Hypothyroidism, unspecified: Secondary | ICD-10-CM

## 2022-08-05 DIAGNOSIS — E782 Mixed hyperlipidemia: Secondary | ICD-10-CM | POA: Diagnosis not present

## 2022-08-05 LAB — POCT GLYCOSYLATED HEMOGLOBIN (HGB A1C): HbA1c, POC (controlled diabetic range): 6.5 % (ref 0.0–7.0)

## 2022-08-05 LAB — THYROID PEROXIDASE ANTIBODY: Thyroperoxidase Ab SerPl-aCnc: 18 IU/mL (ref 0–34)

## 2022-08-05 MED ORDER — METFORMIN HCL ER 500 MG PO TB24
500.0000 mg | ORAL_TABLET | Freq: Every day | ORAL | 1 refills | Status: DC
  Filled 2022-08-05: qty 90, 90d supply, fill #0

## 2022-08-05 NOTE — Progress Notes (Signed)
08/05/2022, 3:08 PM   Endocrinology follow-up note  Subjective:    Patient ID: Heather Burnett, female    DOB: 1971/07/03, PCP Adin Hector, MD   Past Medical History:  Diagnosis Date   Allergy    Anxiety    Asthma    WELL CONTROLLED   Back pain    Cancer ([place])    melanoma on my chest   Chest pain    Constipation    COPD (chronic obstructive pulmonary disease) ([place])    not sure about COPD    DDD (degenerative disc disease), cervical    and lower back   Depression    Diabetes mellitus, type II ([place])    Diverticulitis    DJD (degenerative joint disease)    Dysrhythmia    Empty sella syndrome ([place])    followed at [facility]   Empty sella syndrome [facility])    Family history of adverse reaction to anesthesia    mother almost died with anesthesia in the past, hard to wake up and got real sick on my stomach vomiting, now she receives local anethesia.   GERD (gastroesophageal reflux disease)    Headache    history of migraine   Heart murmur    Hiatal hernia    History of methicillin resistant staphylococcus aureus (MRSA)    History of palpitations 07/2004   Hyperlipidemia    Hypertension    Hypothyroidism    Hypothyroidism, adult 02/08/2020   Idiopathic peripheral neuropathy    Joint pain    Lactose intolerance    Lymphocytosis    Murmur    Neuropathy 2016   Osteoporosis    Palpitations    PCOS (polycystic ovarian syndrome)    Pituitary microadenoma ([place])    followed at [facility]   Pneumonia 2008   Pre-diabetes    Sleep apnea    USES CPAP   Swallowing difficulty    Tachycardia    Thyroid disease    Vitamin D deficiency    Von Willebrand disease ([place]) 2012   PT STATES SHE HAS NEVER HAD ANY ISSUES WITH BLEEDING   Past Surgical History:  Procedure Laterality Date   ACHILLES TENDON SURGERY Left 12/15/2019   Procedure: ACHILLES TENDON REPAIR PRIMARY;  Surgeon: Samara Deist, DPM;  Location: [facility] ORS;  Service:  Podiatry;  Laterality: Left;   ANTERIOR CERVICAL DECOMP/DISCECTOMY FUSION N/A 10/02/2020   Procedure: ANTERIOR CERVICAL DECOMPRESSION/DISCECTOMY FUSION 1 LEVEL C6-7;  Surgeon: Meade Maw, MD;  Location: [facility] ORS;  Service: Neurosurgery;  Laterality: N/A;   BACK SURGERY     removal of 2 lumbar vertebrae   BREAST BIOPSY Right 2010   NEG   CHOLECYSTECTOMY     COLONOSCOPY  03/03/2013, 02/06/2008   COLONOSCOPY WITH PROPOFOL N/A 08/05/2018   Procedure: COLONOSCOPY WITH PROPOFOL;  Surgeon: Manya Silvas, MD;  Location: [facility] ENDOSCOPY;  Service: Endoscopy;  Laterality: N/A;   EGD  02/06/2008   ESOPHAGOGASTRODUODENOSCOPY N/A 06/10/2022   Procedure: ESOPHAGOGASTRODUODENOSCOPY (EGD);  Surgeon: Toledo, Benay Pike, MD;  Location: [facility] ENDOSCOPY;  Service: Gastroenterology;  Laterality: N/A;   GASTROC RECESSION EXTREMITY Left 12/15/2019   Procedure: GASTROC RECESSION EXTREMITY;  Surgeon: Samara Deist, DPM;  Location: [facility] ORS;  Service: Podiatry;  Laterality: Left;   HALLUX VALGUS CHEILECTOMY Left 08/21/2016   Procedure: HALLUX VALGUS CHEILECTOMY;  Surgeon: Sharlotte Alamo, DPM;  Location: [facility] ORS;  Service: Podiatry;  Laterality: Left;   HEMORRHOID SURGERY  10/2014   SPINE SURGERY     TONSILLECTOMY AND ADENOIDECTOMY     Social History   Socioeconomic History   Marital status: Divorced    Spouse name: Not on file   Number of children: Not on file   Years of education: Not on file   Highest education level: Not on file  Occupational History   Occupation: CMA [employer]  Tobacco Use   Smoking status: Former    Years: 8.00    Types: Cigarettes    Quit date: 04/09/1994    Years since quitting: 28.3   Smokeless tobacco: Never  Vaping Use   Vaping Use: Never used  Substance and Sexual Activity   Alcohol use: Yes    Comment: social   Drug use: No   Sexual activity: Not on file  Other Topics Concern   Not on file  Social History Narrative   Divorced since 2009.Lives with 49 year old mum.CMA at  [employer].   Social Determinants of Health   Financial Resource Strain: Not on file  Food Insecurity: Not on file  Transportation Needs: Not on file  Physical Activity: Not on file  Stress: Not on file  Social Connections: Not on file   Family History  Problem Relation Age of Onset   Thyroid disease Mother    Osteoporosis Mother    Diabetes Father    Hyperlipidemia Father    Hypertension Father    Cancer Father    Heart attack Father    Sudden Cardiac Death Father    Obesity Father    Breast cancer Sister 60   Breast cancer Maternal Grandmother 40   Breast cancer Maternal Aunt 50   Outpatient Encounter Medications as of 08/05/2022  Medication Sig   Cholecalciferol (VITAMIN D-3) 125 MCG (5000 UT) TABS Take 2 tablets by mouth daily.   metFORMIN (GLUCOPHAGE-XR) 500 MG 24 hr tablet Take 1 tablet (500 mg total) by mouth daily with breakfast.   acetaminophen (TYLENOL) 500 MG tablet Take 500 mg by mouth every 6 (six) hours as needed for mild pain or headache.    albuterol (VENTOLIN HFA) 108 (90 Base) MCG/ACT inhaler Inhale 2 inhalations into the lungs every 6 (six) hours as needed for Wheezing   atorvastatin (LIPITOR) 40 MG tablet Take 1 tablet (40 mg total) by mouth once daily   cetirizine (ZYRTEC) 10 MG tablet Take 10 mg by mouth daily as needed (seasonal allergies.).    clonazePAM (KLONOPIN) 0.5 MG tablet Take 0.5 mg by mouth daily as needed for anxiety.   docusate sodium (COLACE) 100 MG capsule Take 1 capsule (100 mg total) by mouth 2 (two) times daily as needed for mild constipation.   doxycycline (VIBRAMYCIN) 100 MG capsule Take 1 capsule (100 mg total) by mouth 2 (two) times daily for 7 days   DULoxetine (CYMBALTA) 60 MG capsule Take 1 capsule by mouth daily at  8:00am   fluticasone (FLOVENT HFA) 110 MCG/ACT inhaler Inhale 1 puff by mouth 2 (two) times daily   Fluticasone-Salmeterol (ADVAIR) 250-50 MCG/DOSE AEPB Inhale 1 puff into the lungs every 12 (twelve) hours as  needed (seasonal allergies).    furosemide (LASIX) 40 MG tablet Take 1 tablet (40 mg total) by mouth daily as needed for edema   glucose blood (FREESTYLE LITE) test  strip use to test blood glucose once daily as directed   hydrOXYzine (VISTARIL) 25 MG capsule Take 1 capsule twice daily as needed   hydrOXYzine (VISTARIL) 25 MG capsule Take 1 capsule by mouth twice daily as needed   Lactase (LACTAID PO) Take by mouth.   lamoTRIgine (LAMICTAL) 200 MG tablet Take 1 tablet by mouth daily.   lamoTRIgine (LAMICTAL) 25 MG tablet Take 2 tablets by mouth every evening.   levalbuterol (XOPENEX HFA) 45 MCG/ACT inhaler Inhale 2 puffs into the lungs every 6 (six) hours as needed for wheezing   levocetirizine (XYZAL) 5 MG tablet Take 1 tablet by mouth in the evening.   levothyroxine (SYNTHROID) 50 MCG tablet Take 1 tablet by mouth daily.   metoCLOPramide (REGLAN) 10 MG tablet Take 1 tablet (10 mg total) by mouth 3 (three) times daily as needed   pantoprazole (PROTONIX) 40 MG tablet Take 1 tablet (40 mg total) by mouth 2 (two) times daily, 30 mins before breakfast and 30 mins before dinner. (Patient taking differently: Take by mouth 3 (three) times daily.)   predniSONE (DELTASONE) 10 MG tablet Take 4 tabs daily for 2 days, then 3 tabs daily for 2 days, then 2 tabs daily for 2 days, then 1 tab daily for 2 days   progesterone (PROMETRIUM) 200 MG capsule Take 2 capsules (400 mg total) by mouth at bedtime.   spironolactone (ALDACTONE) 100 MG tablet Take 1 tablet (100 mg total) by mouth once daily.   tiZANidine (ZANAFLEX) 4 MG tablet Take 1 tablet (4 mg total) by mouth 3 (three) times daily as needed   tranexamic acid (LYSTEDA) 650 MG TABS tablet Take 2 tablets (1,300 mg total) by mouth 3 (three) times daily. Take for a maximum of 5 days during monthly menstruation.   triamcinolone cream (KENALOG) 0.1 % Apply topically 2 (two) times daily as needed.   [DISCONTINUED] Vitamin D, Ergocalciferol, (DRISDOL) 1.25 MG (50000  UNIT) CAPS capsule Take 1 capsule by mouth every 7 days.   No facility-administered encounter medications on file as of 08/05/2022.   ALLERGIES: Allergies  Allergen Reactions   Liraglutide -Weight Management Other (See Comments)    GI upset   Polysorbate    Sorbitan Other (See Comments)    GI upset   Adhesive [Tape] Itching and Rash    Paper tape or coban is easier on the skin.   Latex Rash   Penicillins Rash    Updated on 09/26/2020 following APP conversation with patient.  Swelling of the face/tongue/throat, SOB, or low BP? NO Sudden or severe rash/hives, skin peeling, or any reaction on the inside of your mouth or nose? NO - low severity rash to body.  Seek medical attention at a hospital or doctor's office? Yes When did it last happen? Age 39 following "a shot of PCN".       If all above answers are "NO", may proceed with cephalosporin use. - Has taken cefdinir and cephalexin without issues.      VACCINATION STATUS: Immunization History  Administered Date(s) Administered   Influenza Split 08/02/2015, 07/19/2018   Influenza,inj,Quad PF,6+ Mos 07/22/2017   Influenza-Unspecified 06/19/2020   Tdap 11/12/2010, 04/08/2021    HPI JILLYAN PLITT is 51 y.o. female who presents today for follow-up after she was seen in consultation for hypothyroidism, type 2 diabetes.   See notes from previous visit.  She was diagnosed with hypothyroidism at approximate age of 29 years.  She was treated with various dose of thyroid hormone formulations.  Due to problem stabilizing her thyroid function tests, her NP thyroid was switched to levothyroxine during her last visit.  She is currently on levothyroxine 50 mcg p.o. daily before breakfast.      She reports good consistency.  Her previsit labs are consistent with slight over-replacement.    She has no new complaints.  Her previous symptoms of iatrogenic thyrotoxicosis have largely resolved.  She denies any exposure to thyroid iodine ablation  nor thyroidectomy.   she denies dysphagia, shortness of breath nor voice change.  She has family history of various types of thyroid dysfunction, however no thyroid malignancy.  She did not have any thyroid imaging.  She could not tolerate Rybelsus which was tapered off after her last visit.  Her point-of-care A1c is 6.8%, increasing from 5.8%.      Review of Systems  Constitutional:  + Fluctuating body weight, + fatigue, + subjective hyperthermia, no subjective hypothermia   Objective:       08/05/2022    8:23 AM 07/27/2022    2:00 PM 06/10/2022    9:55 AM  Vitals with BMI  Height '5\' 2"'$  '5\' 2"'$    Weight 228 lbs 10 oz 218 lbs   BMI 76.1 60.73   Systolic 710 626 948  Diastolic 88 82 79  Pulse 88 89 75    BP 124/88   Pulse 88   Ht '5\' 2"'$  (1.575 m)   Wt 228 lb 9.6 oz (103.7 kg)   BMI 41.81 kg/m   Wt Readings from Last 3 Encounters:  08/05/22 228 lb 9.6 oz (103.7 kg)  07/27/22 218 lb (98.9 kg)  06/10/22 220 lb (99.8 kg)    Physical Exam  Constitutional:  Body mass index is 41.81 kg/m.,  not in acute distress, normal state of mind Eyes: PERRLA, EOMI, no exophthalmos ENT: moist mucous membranes, no gross thyromegaly, no gross cervical lymphadenopathy   CMP ( most recent) CMP     Component Value Date/Time   NA 140 02/10/2022 0847   K 4.5 02/10/2022 0847   CL 100 02/10/2022 0847   CO2 25 02/10/2022 0847   GLUCOSE 124 (H) 02/10/2022 0847   GLUCOSE 102 (H) 09/05/2021 1318   BUN 9 02/10/2022 0847   CREATININE 0.85 02/10/2022 0847   CREATININE 0.79 04/08/2021 1659   CALCIUM 9.8 02/10/2022 0847   PROT 6.6 02/10/2022 0847   ALBUMIN 4.3 02/10/2022 0847   AST 21 02/10/2022 0847   ALT 21 02/10/2022 0847   ALKPHOS 62 02/10/2022 0847   BILITOT 0.4 02/10/2022 0847   GFRNONAA >60 09/05/2021 1318   GFRNONAA 89 04/08/2021 1659   GFRAA 103 04/08/2021 1659     Diabetic Labs (most recent): Lab Results  Component Value Date   HGBA1C 5.8 05/04/2022   HGBA1C 6.8 (H)  02/10/2022   HGBA1C 6.8 10/27/2021   MICROALBUR <0.2 01/06/2021   MICROALBUR 20.3 (H) 07/11/2015     Lipid Panel ( most recent) Lipid Panel     Component Value Date/Time   CHOL 147 02/10/2022 0847   TRIG 181 (H) 02/10/2022 0847   HDL 46 02/10/2022 0847   CHOLHDL 3.8 04/08/2021 1659   VLDL 33 07/11/2015 0755   LDLCALC 71 02/10/2022 0847   LDLCALC 95 04/08/2021 1659   LABVLDL 30 02/10/2022 0847      Lab Results  Component Value Date   TSH 1.155 08/03/2022   TSH 0.073 (L) 05/01/2022   TSH 0.310 (L) 02/10/2022   TSH 0.741 10/22/2021   TSH <0.01 (L)  04/08/2021   TSH 1.137 07/21/2018   TSH 0.944 07/11/2015   TSH 1.191 04/30/2015   FREET4 0.62 08/03/2022   FREET4 0.86 05/01/2022   FREET4 1.37 02/10/2022   FREET4 0.83 10/22/2021    Labs from outside facilities: May 08, 2021 TSH less than 0.01, November 12, 2020 TSH less than 0.01, January 11, 2020 TSH 0.759, July 14, 2019 TSH less than 0.01.  Assessment & Plan:   1. Hypothyroidism 2.  Type 2 diabetes, controlled with A1c of 5.8%.  Her previsit thyroid function tests are consistent with appropriate replacement.  She is advised to continue levothyroxine 50 mcg p.o. daily before breakfast.   - We discussed about the correct intake of her thyroid hormone, on empty stomach at fasting, with water, separated by at least 30 minutes from breakfast and other medications,  and separated by more than 4 hours from calcium, iron, multivitamins, acid reflux medications (PPIs). -Patient is made aware of the fact that thyroid hormone replacement is needed for life, dose to be adjusted by periodic monitoring of thyroid function tests.  Her baseline thyroid ultrasound is unremarkable.  Regarding her type 2 diabetes with point-of-care A1c of 6.8% increasing from 5.8%.  She did not tolerate Rybelsus.  She would benefit from low-dose metformin and she agrees with this decision.  I discussed and initiated metformin 500 mg XR p.o. daily at  breakfast.    - she acknowledges that there is a room for improvement in her food and drink choices. - Suggestion is made for her to avoid simple carbohydrates  from her diet including Cakes, Sweet Desserts, Ice Cream, Soda (diet and regular), Sweet Tea, Candies, Chips, Cookies, Store Bought Juices, Alcohol , Artificial Sweeteners,  Coffee Creamer, and "Sugar-free" Products, Lemonade. This will help patient to have more stable blood glucose profile and potentially avoid unintended weight gain.  The following Lifestyle Medicine recommendations according to [place]  [facility]) were discussed and and offered to patient and she  agrees to start the journey:  A. Whole Foods, Plant-Based Nutrition comprising of fruits and vegetables, plant-based proteins, whole-grain carbohydrates was discussed in detail with the patient.   A list for source of those nutrients were also provided to the patient.  Patient will use only water or unsweetened tea for hydration. B.  The need to stay away from risky substances including alcohol, smoking; obtaining 7 to 9 hours of restorative sleep, at least 150 minutes of moderate intensity exercise weekly, the importance of healthy social connections,  and stress management techniques were discussed. C.  A full color page of  Calorie density of various food groups per pound showing examples of each food groups was provided to the patient.   She is advised to maintain close follow-up with her PMD.   I spent 31 minutes in the care of the patient today including review of labs from Thyroid Function, CMP, and other relevant labs ; imaging/biopsy records (current and previous including abstractions from other facilities); face-to-face time discussing  her lab results and symptoms, medications doses, her options of short and long term treatment based on the latest standards of care / guidelines;   and documenting the encounter.  Heather Burnett   participated in the discussions, expressed understanding, and voiced agreement with the above plans.  All questions were answered to her satisfaction. she is encouraged to contact clinic should she have any questions or concerns prior to her return visit.   Follow up plan: Return in  about 3 months (around 11/05/2022) for F/U with Pre-visit Labs, A1c -NV.   I spent 31 minutes in the care of the patient today including review of labs from Thyroid Function, CMP, and other relevant labs ; imaging/biopsy records (current and previous including abstractions from other facilities); face-to-face time discussing  her lab results and symptoms, medications doses, her options of short and long term treatment based on the latest standards of care / guidelines;   and documenting the encounter.  Heather Burnett  participated in the discussions, expressed understanding, and voiced agreement with the above plans.  All questions were answered to her satisfaction. she is encouraged to contact clinic should she have any questions or concerns prior to her return visit.  Glade Lloyd, MD [facility] [address] [place], [place] [address] Phone: [phone]  Fax: [phone]     08/05/2022, 3:08 PM  This note was partially dictated with voice recognition software. Similar sounding words can be transcribed inadequately or may not  be corrected upon review.

## 2022-08-05 NOTE — Addendum Note (Signed)
Addended by: Ellin Saba on: 08/05/2022 04:32 PM   Modules accepted: Orders

## 2022-08-05 NOTE — Patient Instructions (Signed)

## 2022-08-06 ENCOUNTER — Telehealth: Payer: Self-pay | Admitting: "Endocrinology

## 2022-08-06 ENCOUNTER — Other Ambulatory Visit (HOSPITAL_COMMUNITY): Payer: Self-pay

## 2022-08-06 DIAGNOSIS — E119 Type 2 diabetes mellitus without complications: Secondary | ICD-10-CM

## 2022-08-06 MED ORDER — FREESTYLE LIBRE 2 SENSOR MISC
2 refills | Status: DC
  Filled 2022-08-06: qty 2, 28d supply, fill #0

## 2022-08-06 MED ORDER — FREESTYLE LIBRE 2 READER DEVI
0 refills | Status: DC
  Filled 2022-08-06: qty 1, 30d supply, fill #0

## 2022-08-06 NOTE — Telephone Encounter (Signed)
Rx's sent to [facility].

## 2022-08-06 NOTE — Telephone Encounter (Signed)
New message    The patient faxing over a form to  be completed for [employer].   Also asking for a new Rx  Free Style Elenor Legato be sent in to [facility] .

## 2022-08-07 ENCOUNTER — Other Ambulatory Visit (HOSPITAL_COMMUNITY): Payer: Self-pay

## 2022-08-08 ENCOUNTER — Other Ambulatory Visit (HOSPITAL_COMMUNITY): Payer: Self-pay

## 2022-08-10 ENCOUNTER — Encounter: Payer: Self-pay | Admitting: "Endocrinology

## 2022-08-12 ENCOUNTER — Other Ambulatory Visit (HOSPITAL_COMMUNITY): Payer: Self-pay

## 2022-08-12 ENCOUNTER — Encounter (HOSPITAL_COMMUNITY): Payer: Self-pay | Admitting: Pharmacist

## 2022-08-13 ENCOUNTER — Other Ambulatory Visit (HOSPITAL_COMMUNITY): Payer: Self-pay

## 2022-08-14 ENCOUNTER — Other Ambulatory Visit (HOSPITAL_COMMUNITY): Payer: Self-pay

## 2022-08-14 MED ORDER — LAMOTRIGINE 200 MG PO TABS
200.0000 mg | ORAL_TABLET | Freq: Every day | ORAL | 0 refills | Status: DC
  Filled 2022-08-14 – 2022-09-02 (×2): qty 90, 90d supply, fill #0

## 2022-08-14 MED ORDER — LAMOTRIGINE 25 MG PO TABS
50.0000 mg | ORAL_TABLET | Freq: Every evening | ORAL | 0 refills | Status: DC
  Filled 2022-08-14 – 2022-09-02 (×2): qty 180, 90d supply, fill #0

## 2022-08-14 MED ORDER — HYDROXYZINE PAMOATE 25 MG PO CAPS
25.0000 mg | ORAL_CAPSULE | Freq: Two times a day (BID) | ORAL | 0 refills | Status: DC | PRN
  Filled 2022-08-14 – 2022-09-02 (×2): qty 180, 90d supply, fill #0

## 2022-08-14 MED ORDER — DULOXETINE HCL 60 MG PO CPEP
60.0000 mg | ORAL_CAPSULE | Freq: Every day | ORAL | 0 refills | Status: DC
  Filled 2022-08-14 – 2022-11-04 (×3): qty 90, 90d supply, fill #0

## 2022-08-17 ENCOUNTER — Other Ambulatory Visit (HOSPITAL_COMMUNITY): Payer: Self-pay

## 2022-08-18 ENCOUNTER — Other Ambulatory Visit (HOSPITAL_COMMUNITY): Payer: Self-pay

## 2022-08-18 ENCOUNTER — Encounter: Payer: Self-pay | Admitting: "Endocrinology

## 2022-08-18 ENCOUNTER — Telehealth: Payer: Self-pay | Admitting: "Endocrinology

## 2022-08-18 MED ORDER — LEVOTHYROXINE SODIUM 50 MCG PO TABS
50.0000 ug | ORAL_TABLET | Freq: Every day | ORAL | 1 refills | Status: DC
  Filled 2022-08-18: qty 90, 90d supply, fill #0

## 2022-08-18 NOTE — Telephone Encounter (Signed)
I sent in Levothyroxine for her to [facility]

## 2022-08-18 NOTE — Telephone Encounter (Signed)
Noted  

## 2022-08-21 ENCOUNTER — Other Ambulatory Visit (HOSPITAL_COMMUNITY): Payer: Self-pay

## 2022-08-21 MED ORDER — LEVOTHYROXINE SODIUM 50 MCG PO TABS
50.0000 ug | ORAL_TABLET | Freq: Every day | ORAL | 1 refills | Status: DC
  Filled 2022-08-21 – 2022-11-25 (×2): qty 90, 90d supply, fill #0
  Filled 2023-03-02: qty 90, 90d supply, fill #1

## 2022-08-21 NOTE — Telephone Encounter (Signed)
Rx sent 

## 2022-08-21 NOTE — Telephone Encounter (Signed)
Pt left a VM stating she asked for Generic Medication to be called in for her thyroid medication. Can you fix this?

## 2022-08-26 ENCOUNTER — Ambulatory Visit: Payer: No Typology Code available for payment source | Admitting: Certified Registered"

## 2022-08-26 ENCOUNTER — Encounter
Admission: RE | Disposition: A | Discharge status: Home / Self Care | Payer: Self-pay | Source: Home / Self Care | Attending: Internal Medicine

## 2022-08-26 ENCOUNTER — Encounter: Payer: Self-pay | Admitting: Internal Medicine

## 2022-08-26 ENCOUNTER — Ambulatory Visit
Admission: RE | Admit: 2022-08-26 | Discharge: 2022-08-26 | Disposition: A | Discharge status: Home / Self Care | Payer: No Typology Code available for payment source | Attending: Internal Medicine | Admitting: Internal Medicine

## 2022-08-26 DIAGNOSIS — J449 Chronic obstructive pulmonary disease, unspecified: Secondary | ICD-10-CM | POA: Diagnosis not present

## 2022-08-26 DIAGNOSIS — F418 Other specified anxiety disorders: Secondary | ICD-10-CM | POA: Insufficient documentation

## 2022-08-26 DIAGNOSIS — K635 Polyp of colon: Secondary | ICD-10-CM | POA: Diagnosis not present

## 2022-08-26 DIAGNOSIS — K640 First degree hemorrhoids: Secondary | ICD-10-CM | POA: Insufficient documentation

## 2022-08-26 DIAGNOSIS — D122 Benign neoplasm of ascending colon: Secondary | ICD-10-CM | POA: Insufficient documentation

## 2022-08-26 DIAGNOSIS — K5730 Diverticulosis of large intestine without perforation or abscess without bleeding: Secondary | ICD-10-CM | POA: Diagnosis not present

## 2022-08-26 DIAGNOSIS — Z7984 Long term (current) use of oral hypoglycemic drugs: Secondary | ICD-10-CM | POA: Diagnosis not present

## 2022-08-26 DIAGNOSIS — I10 Essential (primary) hypertension: Secondary | ICD-10-CM | POA: Diagnosis not present

## 2022-08-26 DIAGNOSIS — K449 Diaphragmatic hernia without obstruction or gangrene: Secondary | ICD-10-CM | POA: Insufficient documentation

## 2022-08-26 DIAGNOSIS — Z87891 Personal history of nicotine dependence: Secondary | ICD-10-CM | POA: Insufficient documentation

## 2022-08-26 DIAGNOSIS — K621 Rectal polyp: Secondary | ICD-10-CM | POA: Diagnosis not present

## 2022-08-26 DIAGNOSIS — Z79899 Other long term (current) drug therapy: Secondary | ICD-10-CM | POA: Diagnosis not present

## 2022-08-26 DIAGNOSIS — M1990 Unspecified osteoarthritis, unspecified site: Secondary | ICD-10-CM | POA: Insufficient documentation

## 2022-08-26 DIAGNOSIS — R194 Change in bowel habit: Secondary | ICD-10-CM | POA: Diagnosis present

## 2022-08-26 DIAGNOSIS — K219 Gastro-esophageal reflux disease without esophagitis: Secondary | ICD-10-CM | POA: Diagnosis not present

## 2022-08-26 DIAGNOSIS — K5900 Constipation, unspecified: Secondary | ICD-10-CM | POA: Insufficient documentation

## 2022-08-26 DIAGNOSIS — K921 Melena: Secondary | ICD-10-CM | POA: Diagnosis present

## 2022-08-26 DIAGNOSIS — E1140 Type 2 diabetes mellitus with diabetic neuropathy, unspecified: Secondary | ICD-10-CM | POA: Diagnosis not present

## 2022-08-26 DIAGNOSIS — G4730 Sleep apnea, unspecified: Secondary | ICD-10-CM | POA: Diagnosis not present

## 2022-08-26 DIAGNOSIS — Z800 Family history of malignant neoplasm of digestive organs: Secondary | ICD-10-CM | POA: Insufficient documentation

## 2022-08-26 HISTORY — PX: COLONOSCOPY: SHX5424

## 2022-08-26 LAB — POCT PREGNANCY, URINE: Preg Test, Ur: NEGATIVE

## 2022-08-26 LAB — GLUCOSE, CAPILLARY: Glucose-Capillary: 160 mg/dL — ABNORMAL HIGH (ref 70–99)

## 2022-08-26 SURGERY — COLONOSCOPY
Anesthesia: General

## 2022-08-26 MED ORDER — PROPOFOL 500 MG/50ML IV EMUL
INTRAVENOUS | Status: DC | PRN
  Administered 2022-08-26: 150 ug/kg/min via INTRAVENOUS

## 2022-08-26 MED ORDER — PROPOFOL 10 MG/ML IV BOLUS
INTRAVENOUS | Status: DC | PRN
  Administered 2022-08-26 (×2): 50 mg via INTRAVENOUS

## 2022-08-26 MED ORDER — LIDOCAINE HCL (CARDIAC) PF 100 MG/5ML IV SOSY
PREFILLED_SYRINGE | INTRAVENOUS | Status: DC | PRN
  Administered 2022-08-26 (×2): 50 mg via INTRAVENOUS

## 2022-08-26 MED ORDER — LACTATED RINGERS IV SOLN: INTRAVENOUS | Status: DC | PRN

## 2022-08-26 MED ORDER — PROPOFOL 1000 MG/100ML IV EMUL
INTRAVENOUS | Status: AC
  Filled 2022-08-26: qty 100

## 2022-08-26 MED ORDER — SODIUM CHLORIDE 0.9 % IV SOLN
INTRAVENOUS | Status: DC
  Administered 2022-08-26: 20 mL/h via INTRAVENOUS

## 2022-08-26 MED ORDER — PROPOFOL 10 MG/ML IV BOLUS
INTRAVENOUS | Status: AC
  Filled 2022-08-26: qty 20

## 2022-08-26 MED ORDER — PHENYLEPHRINE 80 MCG/ML (10ML) SYRINGE FOR IV PUSH (FOR BLOOD PRESSURE SUPPORT)
PREFILLED_SYRINGE | INTRAVENOUS | Status: DC | PRN
  Administered 2022-08-26: 80 ug via INTRAVENOUS

## 2022-08-26 NOTE — H&P (Signed)
Outpatient short stay form Pre-procedure 08/26/2022 8:34 AM Heather Burnett K. Alice Reichert, M.D.  Primary Physician: Ramonita Lab III, M.D.  Reason for visit:  Hematochezia, change in bowel habits.  History of present illness:  Ms. Hepner presents today for complaints of rectal bleeding and change in bowel habits. Patient has exacerbation of constipation over the last few months since changing her diet to help avoid nausea and vomiting. Patient says she "quit eating meat" which is led to more constipation, although she understands that eating meat usually causes more constipation, not less. She has taken Dulcolax with minimal relief. She is taking correct all female laxative with some moderate relief. She has experienced 2 days of rectal bleeding a few weeks ago which is now resolved. She has had a colonoscopy in the past showing diverticulosis and internal hemorrhoids in 2019. Her brother has colon cancer in remission. She just found out her brother also has a skin cancer which was diagnosed and is being staged at this time. Patient complains of diffuse abdominal bloating and discomfort somewhat relieved with having a bowel movement. She continues to have problems with constipation that has not completely relieved. In regards to her initial symptoms in May 2023 of nausea vomiting, this is improved although not completely resolved that she still has to take Reglan about once a week. No dysphagia.    No current facility-administered medications for this encounter.  Medications Prior to Admission  Medication Sig Dispense Refill Last Dose   acetaminophen (TYLENOL) 500 MG tablet Take 500 mg by mouth every 6 (six) hours as needed for mild pain or headache.       albuterol (VENTOLIN HFA) 108 (90 Base) MCG/ACT inhaler Inhale 2 inhalations into the lungs every 6 (six) hours as needed for Wheezing 18 g 11    atorvastatin (LIPITOR) 40 MG tablet Take 1 tablet (40 mg total) by mouth once daily 30 tablet 11    cetirizine  (ZYRTEC) 10 MG tablet Take 10 mg by mouth daily as needed (seasonal allergies.).       Cholecalciferol (VITAMIN D-3) 125 MCG (5000 UT) TABS Take 2 tablets by mouth daily.      clonazePAM (KLONOPIN) 0.5 MG tablet Take 0.5 mg by mouth daily as needed for anxiety.      Continuous Blood Gluc Receiver (FREESTYLE LIBRE 2 READER) DEVI Use to check glucose as directed 1 each 0    Continuous Blood Gluc Sensor (FREESTYLE LIBRE 2 SENSOR) MISC Change sensor every 14 days. 2 each 2    docusate sodium (COLACE) 100 MG capsule Take 1 capsule (100 mg total) by mouth 2 (two) times daily as needed for mild constipation. 20 capsule 0    doxycycline (VIBRAMYCIN) 100 MG capsule Take 1 capsule (100 mg total) by mouth 2 (two) times daily for 7 days 14 capsule 0    DULoxetine (CYMBALTA) 60 MG capsule Take 1 capsule by mouth daily at  8:00am 90 capsule 0    DULoxetine (CYMBALTA) 60 MG capsule Take 1 capsule (60 mg total) by mouth daily at 8AM. 90 capsule 0    fluticasone (FLOVENT HFA) 110 MCG/ACT inhaler Inhale 1 puff by mouth 2 (two) times daily 12 g 11    Fluticasone-Salmeterol (ADVAIR) 250-50 MCG/DOSE AEPB Inhale 1 puff into the lungs every 12 (twelve) hours as needed (seasonal allergies).       furosemide (LASIX) 40 MG tablet Take 1 tablet (40 mg total) by mouth daily as needed for edema 30 tablet 5    glucose blood (  FREESTYLE LITE) test strip use to test blood glucose once daily as directed 100 each 0    hydrOXYzine (VISTARIL) 25 MG capsule Take 1 capsule twice daily as needed 180 capsule 0    hydrOXYzine (VISTARIL) 25 MG capsule Take 1 capsule by mouth twice daily as needed 180 capsule 0    hydrOXYzine (VISTARIL) 25 MG capsule Take 1 capsule (25 mg total) by mouth 2 (two) times daily as needed. 180 capsule 0    Lactase (LACTAID PO) Take by mouth.      lamoTRIgine (LAMICTAL) 200 MG tablet Take 1 tablet (200 mg total) by mouth daily. 90 tablet 0    lamoTRIgine (LAMICTAL) 25 MG tablet Take 2 tablets (50 mg total) by  mouth nightly. 180 tablet 0    levalbuterol (XOPENEX HFA) 45 MCG/ACT inhaler Inhale 2 puffs into the lungs every 6 (six) hours as needed for wheezing 15 g 12    levocetirizine (XYZAL) 5 MG tablet Take 1 tablet by mouth in the evening. 30 tablet 11    levothyroxine (SYNTHROID) 50 MCG tablet Take 1 tablet (50 mcg total) by mouth daily. 90 tablet 1    metFORMIN (GLUCOPHAGE-XR) 500 MG 24 hr tablet Take 1 tablet (500 mg total) by mouth daily with breakfast. 90 tablet 1    metoCLOPramide (REGLAN) 10 MG tablet Take 1 tablet (10 mg total) by mouth 3 (three) times daily as needed 60 tablet 0    pantoprazole (PROTONIX) 40 MG tablet Take 1 tablet (40 mg total) by mouth 2 (two) times daily, 30 mins before breakfast and 30 mins before dinner. (Patient taking differently: Take by mouth 3 (three) times daily.) 60 tablet 3    predniSONE (DELTASONE) 10 MG tablet Take 4 tabs daily for 2 days, then 3 tabs daily for 2 days, then 2 tabs daily for 2 days, then 1 tab daily for 2 days 20 tablet 0    progesterone (PROMETRIUM) 200 MG capsule Take 2 capsules (400 mg total) by mouth at bedtime. 180 capsule 3    spironolactone (ALDACTONE) 100 MG tablet Take 1 tablet (100 mg total) by mouth once daily. 90 tablet 3    tiZANidine (ZANAFLEX) 4 MG tablet Take 1 tablet (4 mg total) by mouth 3 (three) times daily as needed 20 tablet 1    tranexamic acid (LYSTEDA) 650 MG TABS tablet Take 2 tablets (1,300 mg total) by mouth 3 (three) times daily. Take for a maximum of 5 days during monthly menstruation. 30 tablet 3    triamcinolone cream (KENALOG) 0.1 % Apply topically 2 (two) times daily as needed.        Allergies  Allergen Reactions   Liraglutide -Weight Management Other (See Comments)    GI upset   Polysorbate    Sorbitan Other (See Comments)    GI upset   Adhesive [Tape] Itching and Rash    Paper tape or coban is easier on the skin.   Latex Rash   Penicillins Rash    Updated on 09/26/2020 following APP conversation with  patient.  Swelling of the face/tongue/throat, SOB, or low BP? NO Sudden or severe rash/hives, skin peeling, or any reaction on the inside of your mouth or nose? NO - low severity rash to body.  Seek medical attention at a hospital or doctor's office? Yes When did it last happen? Age 85 following "a shot of PCN".       If all above answers are "NO", may proceed with cephalosporin use. - Has taken cefdinir  and cephalexin without issues.       Past Medical History:  Diagnosis Date   Allergy    Anxiety    Asthma    WELL CONTROLLED   Back pain    Cancer ([place])    melanoma on my chest   Chest pain    Constipation    COPD (chronic obstructive pulmonary disease) (HCC)    not sure about COPD    DDD (degenerative disc disease), cervical    and lower back   Depression    Diabetes mellitus, type II (HCC)    Diverticulitis    DJD (degenerative joint disease)    Dysrhythmia    Empty sella syndrome ([place])    followed at [facility]   Empty sella syndrome [facility])    Family history of adverse reaction to anesthesia    mother almost died with anesthesia in the past, hard to wake up and got real sick on my stomach vomiting, now she receives local anethesia.   GERD (gastroesophageal reflux disease)    Headache    history of migraine   Heart murmur    Hiatal hernia    History of methicillin resistant staphylococcus aureus (MRSA)    History of palpitations 07/2004   Hyperlipidemia    Hypertension    Hypothyroidism    Hypothyroidism, adult 02/08/2020   Idiopathic peripheral neuropathy    Joint pain    Lactose intolerance    Lymphocytosis    Murmur    Neuropathy 2016   Osteoporosis    Palpitations    PCOS (polycystic ovarian syndrome)    Pituitary microadenoma ([place])    followed at [facility]   Pneumonia 2008   Pre-diabetes    Sleep apnea    USES CPAP   Swallowing difficulty    Tachycardia    Thyroid disease    Vitamin D deficiency    Von Willebrand disease ([place]) 2012   PT STATES SHE HAS NEVER  HAD ANY ISSUES WITH BLEEDING    Review of systems:  Otherwise negative.    Physical Exam  Gen: Alert, oriented. Appears stated age.  HEENT: [place]/AT. PERRLA. Lungs: CTA, no wheezes. CV: RR nl S1, S2. Abd: soft, benign, no masses. BS+ Ext: No edema. Pulses 2+    Planned procedures: Proceed with colonoscopy. The patient understands the nature of the planned procedure, indications, risks, alternatives and potential complications including but not limited to bleeding, infection, perforation, damage to internal organs and possible oversedation/side effects from anesthesia. The patient agrees and gives consent to proceed.  Please refer to procedure notes for findings, recommendations and patient disposition/instructions.     Kassity Woodson K. Alice Reichert, M.D. Gastroenterology 08/26/2022  8:34 AM

## 2022-08-26 NOTE — Op Note (Addendum)
[facility] Gastroenterology Patient Name: Heather Burnett Procedure Date: 08/26/2022 9:00 AM MRN: [id number] Account #: [id number] Date of Birth: 09-Mar-1971 Admit Type: Outpatient Age: 51 Room: [facility] ENDO ROOM 2 Gender: Female Note Status: Finalized Instrument Name: Jasper Riling [id number] Procedure:             Colonoscopy Indications:           Hematochezia, Change in bowel habits, Constipation Providers:             Benay Pike. Alice Reichert MD, MD Referring MD:          Ramonita Lab, MD (Referring MD) Medicines:             Propofol per Anesthesia Complications:         No immediate complications. Estimated blood loss:                         Minimal. Procedure:             Pre-Anesthesia Assessment:                        - The risks and benefits of the procedure and the                         sedation options and risks were discussed with the                         patient. All questions were answered and informed                         consent was obtained.                        - Patient identification and proposed procedure were                         verified prior to the procedure by the nurse. The                         procedure was verified in the procedure room.                        - ASA Grade Assessment: III - A patient with severe                         systemic disease.                        - After reviewing the risks and benefits, the patient                         was deemed in satisfactory condition to undergo the                         procedure.                        After obtaining informed consent, the colonoscope was                         passed under direct  vision. Throughout the procedure,                         the patient's blood pressure, pulse, and oxygen                         saturations were monitored continuously. The                         Colonoscope was introduced through the anus and                         advanced  to the the cecum, identified by appendiceal                         orifice and ileocecal valve. The colonoscopy was                         performed without difficulty. The patient tolerated                         the procedure well. The quality of the bowel                         preparation was good. The ileocecal valve, appendiceal                         orifice, and rectum were photographed. Findings:      The perianal and digital rectal examinations were normal. Pertinent       negatives include normal sphincter tone and no palpable rectal lesions.      Non-bleeding internal hemorrhoids were found during retroflexion. The       hemorrhoids were Grade I (internal hemorrhoids that do not prolapse).      Many medium-mouthed and small-mouthed diverticula were found in the       sigmoid colon and transverse colon. There was no evidence of       diverticular bleeding.      Three sessile polyps were found in the rectum and descending colon. The       polyps were 3 to 5 mm in size. These polyps were removed with a jumbo       cold forceps. Resection was complete, but the polyp tissue was only       partially retrieved.      Rectal polyp not completely retrieved. Estimated blood loss was minimal.      The exam was otherwise without abnormality. Impression:            - Non-bleeding internal hemorrhoids.                        - Mild diverticulosis in the sigmoid colon and in the                         transverse colon. There was no evidence of                         diverticular bleeding.                        - Three 3  to 5 mm polyps in the rectum and in the                         descending colon, removed with a jumbo cold forceps.                         Complete resection. Partial retrieval.                        - The examination was otherwise normal. Recommendation:        - Patient has a contact number available for                         emergencies. The signs and symptoms  of potential                         delayed complications were discussed with the patient.                         Return to normal activities tomorrow. Written                         discharge instructions were provided to the patient.                        - High fiber diet.                        - Use Miralax 2 capfuls (34 grams) PO at HS.                        - Return to my office in 3 months.                        - Telephone GI office to schedule appointment.                        - The findings and recommendations were discussed with                         the patient. Procedure Code(s):     --- Professional ---                        [phone], Colonoscopy, flexible; with biopsy, single or                         multiple Diagnosis Code(s):     --- Professional ---                        K57.30, Diverticulosis of large intestine without                         perforation or abscess without bleeding                        K59.00, Constipation, unspecified                        R19.4, Change in  bowel habit                        K92.1, Melena (includes Hematochezia)                        D12.4, Benign neoplasm of descending colon                        D12.8, Benign neoplasm of rectum                        K64.0, First degree hemorrhoids CPT copyright 2022 American Medical Association. All rights reserved. The codes documented in this report are preliminary and upon coder review may  be revised to meet current compliance requirements. Efrain Sella MD, MD 08/26/2022 9:42:27 AM This report has been signed electronically. Number of Addenda: 0 Note Initiated On: 08/26/2022 9:00 AM Scope Withdrawal Time: 0 hours 9 minutes 51 seconds  Total Procedure Duration: 0 hours 11 minutes 42 seconds  Estimated Blood Loss:  Estimated blood loss was minimal.      [facility]

## 2022-08-26 NOTE — Anesthesia Preprocedure Evaluation (Signed)
Anesthesia Evaluation  Patient identified by MRN, date of birth, ID band Patient awake    Reviewed: Allergy & Precautions, H&P , NPO status , Patient's Chart, lab work & pertinent test results, reviewed documented beta blocker date and time   History of Anesthesia Complications Negative for: history of anesthetic complications  Airway Mallampati: III  TM Distance: >3 FB Neck ROM: full    Dental  (+) Teeth Intact, Dental Advidsory Given, Implants Permanent retainer:   Pulmonary neg shortness of breath, asthma , sleep apnea and Continuous Positive Airway Pressure Ventilation , pneumonia, COPD, neg recent URI, former smoker   Pulmonary exam normal        Cardiovascular Exercise Tolerance: Good hypertension, On Medications (-) angina (-) Past MI and (-) Cardiac Stents Normal cardiovascular exam+ dysrhythmias + Valvular Problems/Murmurs  Rhythm:regular Rate:Normal     Neuro/Psych  Headaches, neg Seizures PSYCHIATRIC DISORDERS Anxiety Depression       GI/Hepatic Neg liver ROS, hiatal hernia,GERD  Medicated and Controlled,,  Endo/Other  negative endocrine ROSdiabetesHypothyroidism    Renal/GU negative Renal ROS  negative genitourinary   Musculoskeletal  (+) Arthritis , Osteoarthritis,    Abdominal   Peds  Hematology negative hematology ROS (+)   Anesthesia Other Findings Past Medical History: No date: Allergy No date: Anxiety No date: Asthma     Comment:  WELL CONTROLLED No date: Cancer ([place])     Comment:  melanoma on my chest No date: COPD (chronic obstructive pulmonary disease) (HCC) No date: DDD (degenerative disc disease), cervical     Comment:  and lower back No date: Depression No date: DJD (degenerative joint disease) No date: Dysrhythmia No date: Empty sella syndrome (HCC)     Comment:  followed at [facility] No date: Family history of adverse reaction to anesthesia     Comment:  mother almost died with  anesthesia in the past, hard to               wake up and got real sick on my stomach vomiting, now she              receives local anethesia. No date: GERD (gastroesophageal reflux disease) No date: Headache     Comment:  history of migraine No date: Heart murmur No date: History of methicillin resistant staphylococcus aureus (MRSA) 07/2004: History of palpitations No date: Hyperlipidemia No date: Hypertension No date: Hypothyroidism No date: Lymphocytosis 2016: Neuropathy No date: PCOS (polycystic ovarian syndrome) No date: Pituitary microadenoma [facility])     Comment:  followed at [facility] 2008: Pneumonia No date: Pre-diabetes No date: Sleep apnea     Comment:  USES CPAP No date: Thyroid disease 2012: Von Willebrand disease ([place])     Comment:  PT STATES SHE HAS NEVER HAD ANY ISSUES WITH BLEEDING Past Surgical History: No date: BACK SURGERY     Comment:  removal of 2 lumbar vertebrae 2010: BREAST BIOPSY; Right     Comment:  NEG No date: CHOLECYSTECTOMY 03/03/2013, 02/06/2008: COLONOSCOPY 08/05/2018: COLONOSCOPY WITH PROPOFOL; N/A     Comment:  Procedure: COLONOSCOPY WITH PROPOFOL;  Surgeon: Manya Silvas, MD;  Location: [facility] ENDOSCOPY;  Service:               Endoscopy;  Laterality: N/A; 02/06/2008: EGD 08/21/2016: HALLUX VALGUS CHEILECTOMY; Left     Comment:  Procedure: HALLUX VALGUS CHEILECTOMY;  Surgeon: Sherren Mocha  Cleda Mccreedy, DPM;  Location: [facility] ORS;  Service: Podiatry;                Laterality: Left; 10/2014: HEMORRHOID SURGERY No date: SPINE SURGERY No date: TONSILLECTOMY AND ADENOIDECTOMY   Reproductive/Obstetrics negative OB ROS                             Anesthesia Physical Anesthesia Plan  ASA: 3  Anesthesia Plan: General   Post-op Pain Management: Minimal or no pain anticipated   Induction: Intravenous  PONV Risk Score and Plan: 3 and Propofol infusion and TIVA  Airway Management Planned: Natural Airway and  Nasal Cannula  Additional Equipment: None  Intra-op Plan:   Post-operative Plan: Extubation in OR  Informed Consent: I have reviewed the patients History and Physical, chart, labs and discussed the procedure including the risks, benefits and alternatives for the proposed anesthesia with the patient or authorized representative who has indicated his/her understanding and acceptance.     Dental advisory given  Plan Discussed with: CRNA  Anesthesia Plan Comments: (Discussed risks of anesthesia with patient, including possibility of difficulty with spontaneous ventilation under anesthesia necessitating airway intervention, PONV, and rare risks such as cardiac or respiratory or neurological events, and allergic reactions. Discussed the role of CRNA in patient's perioperative care. Patient understands.)        Anesthesia Quick Evaluation

## 2022-08-26 NOTE — Interval H&P Note (Signed)
History and Physical Interval Note:  08/26/2022 8:35 AM  Heather Burnett  has presented today for surgery, with the diagnosis of Gastroesophageal reflux disease, unspecified whether esophagitis present (K21.9)Hematochezia (K92.1) Change in bowel habits (R19.4).  The various methods of treatment have been discussed with the patient and family. After consideration of risks, benefits and other options for treatment, the patient has consented to  Procedure(s): COLONOSCOPY (N/A) ESOPHAGOGASTRODUODENOSCOPY (EGD) (N/A) as a surgical intervention.  The patient's history has been reviewed, patient examined, no change in status, stable for surgery.  I have reviewed the patient's chart and labs.  Questions were answered to the patient's satisfaction.     [place], [place]

## 2022-08-26 NOTE — Transfer of Care (Signed)
Immediate Anesthesia Transfer of Care Note  Patient: Heather Burnett  Procedure(s) Performed: COLONOSCOPY  Patient Location: PACU  Anesthesia Type:MAC  Level of Consciousness: awake, alert , and oriented  Airway & Oxygen Therapy: Patient Spontanous Breathing  Post-op Assessment: Report given to RN and Post -op Vital signs reviewed and stable  Post vital signs: Reviewed and stable  Last Vitals:  Vitals Value Taken Time  BP 113/69 08/26/22 0938  Temp    Pulse 94 08/26/22 0941  Resp 15 08/26/22 0941  SpO2 98 % 08/26/22 0941  Vitals shown include unvalidated device data.  Last Pain:  Vitals:   08/26/22 0938  TempSrc:   PainSc: 0-No pain         Complications: No notable events documented.

## 2022-08-27 ENCOUNTER — Encounter: Payer: Self-pay | Admitting: Internal Medicine

## 2022-08-27 LAB — SURGICAL PATHOLOGY

## 2022-08-31 ENCOUNTER — Encounter (INDEPENDENT_AMBULATORY_CARE_PROVIDER_SITE_OTHER): Payer: Self-pay | Admitting: Family Medicine

## 2022-08-31 ENCOUNTER — Ambulatory Visit (INDEPENDENT_AMBULATORY_CARE_PROVIDER_SITE_OTHER): Payer: No Typology Code available for payment source | Admitting: Family Medicine

## 2022-08-31 VITALS — BP 130/85 | HR 84 | Temp 97.9°F | Ht 62.0 in | Wt 227.4 lb

## 2022-08-31 DIAGNOSIS — E669 Obesity, unspecified: Secondary | ICD-10-CM

## 2022-08-31 DIAGNOSIS — E1169 Type 2 diabetes mellitus with other specified complication: Secondary | ICD-10-CM

## 2022-08-31 DIAGNOSIS — Z6841 Body Mass Index (BMI) 40.0 and over, adult: Secondary | ICD-10-CM | POA: Diagnosis not present

## 2022-08-31 DIAGNOSIS — Z7984 Long term (current) use of oral hypoglycemic drugs: Secondary | ICD-10-CM | POA: Diagnosis not present

## 2022-09-02 ENCOUNTER — Other Ambulatory Visit (HOSPITAL_COMMUNITY): Payer: Self-pay

## 2022-09-03 ENCOUNTER — Other Ambulatory Visit (HOSPITAL_COMMUNITY): Payer: Self-pay

## 2022-09-03 MED ORDER — CYCLOBENZAPRINE HCL 5 MG PO TABS
5.0000 mg | ORAL_TABLET | Freq: Three times a day (TID) | ORAL | 5 refills | Status: DC | PRN
  Filled 2022-09-03: qty 60, 20d supply, fill #0
  Filled 2022-11-04: qty 60, 20d supply, fill #1
  Filled 2023-01-27: qty 60, 20d supply, fill #2

## 2022-09-07 ENCOUNTER — Other Ambulatory Visit (HOSPITAL_COMMUNITY): Payer: Self-pay

## 2022-09-07 NOTE — Anesthesia Postprocedure Evaluation (Signed)
Anesthesia Post Note  Patient: Heather Burnett  Procedure(s) Performed: COLONOSCOPY  Patient location during evaluation: PACU Anesthesia Type: General Level of consciousness: awake and alert Pain management: pain level controlled Vital Signs Assessment: post-procedure vital signs reviewed and stable Respiratory status: spontaneous breathing, nonlabored ventilation, respiratory function stable and patient connected to nasal cannula oxygen Cardiovascular status: blood pressure returned to baseline and stable Postop Assessment: no apparent nausea or vomiting Anesthetic complications: no   No notable events documented.   Last Vitals:  Vitals:   08/26/22 0847 08/26/22 0958  BP: 123/78 123/65  Pulse: 86 85  Resp: 20   Temp: (!) 35.6 C (!) 35.8 C  SpO2: 97%     Last Pain:  Vitals:   08/27/22 0751  TempSrc:   PainSc: 0-No pain                 Martha Clan

## 2022-09-08 NOTE — Progress Notes (Signed)
Chief Complaint:   OBESITY Heather Burnett is here to discuss her progress with her obesity treatment plan along with follow-up of her obesity related diagnoses. Heather Burnett is on the Category 3 Plan and states she is following her eating plan approximately 0% of the time. Heather Burnett states she is walking 35-45 minutes 3-4 times per week.  Today's visit was #: 8 Starting weight: 235 lbs Starting date: 02/10/2022 Today's weight: 227 lbs Today's date: 08/31/2022 Total lbs lost to date: 8 lbs Total lbs lost since last in-office visit: +9 lbs  Interim History: Had colonoscopy recently.  Has not been following meal plan at all lately.  Feels she may need a change.  We reviewed  low carb, PC/Vansant, journaling.  She prefers to try journaling at this time.      Subjective:   1. Type 2 diabetes mellitus with other specified complication, without long-term current use of insulin ([place]) Discussed labs with patient today. 07/17, Endo discontinued Rybelsus due to GI upset. 10/18 they restarted metformin at that time.  A1c 6.9 two weeks ago.  FBS 120-146.    Assessment/Plan:  No orders of the defined types were placed in this encounter.   There are no discontinued medications.   No orders of the defined types were placed in this encounter.    1. Type 2 diabetes mellitus with other specified complication, without long-term current use of insulin ([place]) Long discussion with patient how proteins, carbs etc, will either stimulate hunger and craving pathways or decrease those hormones.  Medication management per Endo.   2. Obesity, Current BMI 41.6 She also handed me a packet of information at the end of her office visit from testing she had at [facility].  I told patient I would review it and we would go over it at next office visit.   Heather Burnett is currently in the action stage of change. As such, her goal is to continue with weight loss efforts. She has agreed to changing to keeping a food journal and  adhering to recommended goals of 1600-1800 calories and 110+ protein daily.   Exercise goals:  As is,.   Behavioral modification strategies: increasing lean protein intake, planning for success, and keeping a strict food journal.  Heather Burnett has agreed to follow-up with our clinic in 3 weeks. She was informed of the importance of frequent follow-up visits to maximize her success with intensive lifestyle modifications for her multiple health conditions.   Objective:   Blood pressure 130/85, pulse 84, temperature 97.9 F (36.6 C), height '5\' 2"'$  (1.575 m), weight 227 lb 6.4 oz (103.1 kg), SpO2 96 %. Body mass index is 41.59 kg/m.  General: Cooperative, alert, well developed, in no acute distress. HEENT: Conjunctivae and lids unremarkable. Cardiovascular: Regular rhythm.  Lungs: Normal work of breathing. Neurologic: No focal deficits.   Lab Results  Component Value Date   CREATININE 0.85 02/10/2022   BUN 9 02/10/2022   NA 140 02/10/2022   K 4.5 02/10/2022   CL 100 02/10/2022   CO2 25 02/10/2022   Lab Results  Component Value Date   ALT 21 02/10/2022   AST 21 02/10/2022   ALKPHOS 62 02/10/2022   BILITOT 0.4 02/10/2022   Lab Results  Component Value Date   HGBA1C 6.5 08/05/2022   HGBA1C 5.8 05/04/2022   HGBA1C 6.8 (H) 02/10/2022   HGBA1C 6.8 10/27/2021   HGBA1C 6.1 (H) 04/08/2021   Lab Results  Component Value Date   INSULIN 32.3 (H) 02/10/2022  Lab Results  Component Value Date   TSH 1.155 08/03/2022   Lab Results  Component Value Date   CHOL 147 02/10/2022   HDL 46 02/10/2022   LDLCALC 71 02/10/2022   TRIG 181 (H) 02/10/2022   CHOLHDL 3.8 04/08/2021   Lab Results  Component Value Date   VD25OH 39.9 02/10/2022   VD25OH 45.8 07/11/2015   Lab Results  Component Value Date   WBC 13.5 (H) 09/05/2021   HGB 13.8 09/05/2021   HCT 42.1 09/05/2021   MCV 90.0 09/05/2021   PLT 341 09/05/2021   No results found for: "IRON", "TIBC", "FERRITIN"  Attestation  Statements:   Reviewed by clinician on day of visit: allergies, medications, problem list, medical history, surgical history, family history, social history, and previous encounter notes.  I, Davy Pique, RMA, am acting as Location manager for [employer], DO.  I have reviewed the above documentation for accuracy and completeness, and I agree with the above. Marjory Sneddon, D.O.  The [place] was signed into law in 2016 which includes the topic of electronic health records.  This provides immediate access to information in MyChart.  This includes consultation notes, operative notes, office notes, lab results and pathology reports.  If you have any questions about what you read please let us know at your next visit so we can discuss your concerns and take corrective action if need be.  We are right here with you.

## 2022-09-21 ENCOUNTER — Ambulatory Visit (INDEPENDENT_AMBULATORY_CARE_PROVIDER_SITE_OTHER): Payer: No Typology Code available for payment source | Admitting: Internal Medicine

## 2022-09-25 ENCOUNTER — Other Ambulatory Visit (HOSPITAL_COMMUNITY): Payer: Self-pay

## 2022-09-25 ENCOUNTER — Ambulatory Visit: Payer: No Typology Code available for payment source | Attending: Internal Medicine | Admitting: Internal Medicine

## 2022-09-25 ENCOUNTER — Encounter: Payer: Self-pay | Admitting: Internal Medicine

## 2022-09-25 VITALS — BP 116/78 | HR 83 | Ht 62.0 in | Wt 233.0 lb

## 2022-09-25 DIAGNOSIS — I10 Essential (primary) hypertension: Secondary | ICD-10-CM | POA: Diagnosis not present

## 2022-09-25 DIAGNOSIS — R002 Palpitations: Secondary | ICD-10-CM

## 2022-09-25 DIAGNOSIS — R079 Chest pain, unspecified: Secondary | ICD-10-CM | POA: Diagnosis not present

## 2022-09-25 DIAGNOSIS — I2585 Chronic coronary microvascular dysfunction: Secondary | ICD-10-CM | POA: Diagnosis not present

## 2022-09-25 DIAGNOSIS — R0789 Other chest pain: Secondary | ICD-10-CM | POA: Insufficient documentation

## 2022-09-25 MED ORDER — NITROGLYCERIN 0.4 MG SL SUBL
0.4000 mg | SUBLINGUAL_TABLET | SUBLINGUAL | 3 refills | Status: AC | PRN
  Filled 2022-09-25: qty 25, 10d supply, fill #0
  Filled 2022-09-30: qty 25, 1d supply, fill #0
  Filled 2022-11-04: qty 25, 8d supply, fill #1

## 2022-09-25 MED ORDER — METOPROLOL TARTRATE 25 MG PO TABS
25.0000 mg | ORAL_TABLET | Freq: Two times a day (BID) | ORAL | 3 refills | Status: DC
  Filled 2022-09-25 – 2022-09-30 (×2): qty 180, 90d supply, fill #0
  Filled 2023-01-27: qty 180, 90d supply, fill #1

## 2022-09-25 MED ORDER — SPIRONOLACTONE 50 MG PO TABS
50.0000 mg | ORAL_TABLET | Freq: Every day | ORAL | 3 refills | Status: DC
  Filled 2022-09-25 – 2022-09-30 (×2): qty 90, 90d supply, fill #0
  Filled 2023-01-27: qty 90, 90d supply, fill #1
  Filled 2023-04-26: qty 90, 90d supply, fill #2
  Filled 2023-08-27: qty 90, 90d supply, fill #3

## 2022-09-25 NOTE — Progress Notes (Signed)
Cardiology Office Note  Date: 09/25/2022   ID: Heather Burnett, DOB 1971/02/04, MRN [id number]  PCP:  Adin Hector, MD  Cardiologist:  Chalmers Guest, MD Electrophysiologist:  None   Reason for Office Visit: Chest pain   History of Present Illness: Heather Burnett is a 51 y.o. female known to have DM 2 presented to cardiology clinic for follow-up visit.  Patient was prior patient of Dr. Bronson Ing.  She was initially referred to cardiology clinic for evaluation of dyspnea exertion and abnormal EKG.  Nuclear stress test in 2019 showed no evidence of ischemia and normal LVEF.  Echocardiogram demonstrated normal LVEF, 60 to 70% and diastolic function could not be assessed.  Coronary CT angiography showed no CAD with a calcium score of 0 in 2020.  At that time, she had a lot of stress going on in her life, her dad passed away and she was taking care of her mother resulting in chest pain and palpitation symptoms. She was on metoprolol with adequate control of palpitations and her spironolactone was discontinued in the last clinic visit from 2021. She is here for follow-up visit.  She continues to have substernal chest discomfort lasting for 30 minutes, occurs 2-3 times per week, occurs mainly with exertion and resolves with rest.  Associated with palpitations and SOB.  She is currently on spironolactone 100 mg and Lasix 40 mg once daily for diuresis.  She is not on metoprolol.  Denies any syncope, dizziness, LE swelling.  Past Medical History:  Diagnosis Date   Allergy    Anxiety    Asthma    WELL CONTROLLED   Back pain    Cancer ([place])    melanoma on my chest   Chest pain    Constipation    COPD (chronic obstructive pulmonary disease) (HCC)    not sure about COPD    DDD (degenerative disc disease), cervical    and lower back   Depression    Diabetes mellitus, type II (HCC)    Diverticulitis    DJD (degenerative joint disease)    Dysrhythmia    Empty sella syndrome ([place])     followed at [facility]   Empty sella syndrome [facility])    Family history of adverse reaction to anesthesia    mother almost died with anesthesia in the past, hard to wake up and got real sick on my stomach vomiting, now she receives local anethesia.   GERD (gastroesophageal reflux disease)    Headache    history of migraine   Heart murmur    Hiatal hernia    History of methicillin resistant staphylococcus aureus (MRSA)    History of palpitations 07/2004   Hyperlipidemia    Hypertension    Hypothyroidism    Hypothyroidism, adult 02/08/2020   Idiopathic peripheral neuropathy    Joint pain    Lactose intolerance    Lymphocytosis    Murmur    Neuropathy 2016   Osteoporosis    Palpitations    PCOS (polycystic ovarian syndrome)    Pituitary microadenoma ([place])    followed at [facility]   Pneumonia 2008   Pre-diabetes    Sleep apnea    USES CPAP   Swallowing difficulty    Tachycardia    Thyroid disease    Vitamin D deficiency    Von Willebrand disease ([place]) 2012   PT STATES SHE HAS NEVER HAD ANY ISSUES WITH BLEEDING    Past Surgical History:  Procedure Laterality Date  ACHILLES TENDON SURGERY Left 12/15/2019   Procedure: ACHILLES TENDON REPAIR PRIMARY;  Surgeon: Samara Deist, DPM;  Location: [facility] ORS;  Service: Podiatry;  Laterality: Left;   ANTERIOR CERVICAL DECOMP/DISCECTOMY FUSION N/A 10/02/2020   Procedure: ANTERIOR CERVICAL DECOMPRESSION/DISCECTOMY FUSION 1 LEVEL C6-7;  Surgeon: Meade Maw, MD;  Location: [facility] ORS;  Service: Neurosurgery;  Laterality: N/A;   BACK SURGERY     removal of 2 lumbar vertebrae   BREAST BIOPSY Right 2010   NEG   CHOLECYSTECTOMY     COLONOSCOPY  03/03/2013, 02/06/2008   COLONOSCOPY N/A 08/26/2022   Procedure: COLONOSCOPY;  Surgeon: Toledo, Benay Pike, MD;  Location: [facility] ENDOSCOPY;  Service: Gastroenterology;  Laterality: N/A;   COLONOSCOPY WITH PROPOFOL N/A 08/05/2018   Procedure: COLONOSCOPY WITH PROPOFOL;  Surgeon: Manya Silvas, MD;   Location[facility];  Service: Endoscopy;  Laterality: N/A;   EGD  02/06/2008   ESOPHAGOGASTRODUODENOSCOPY N/A 06/10/2022   Procedure: ESOPHAGOGASTRODUODENOSCOPY (EGD);  Surgeon: Toledo, Benay Pike, MD;  Location: [facility] ENDOSCOPY;  Service: Gastroenterology;  Laterality: N/A;   GASTROC RECESSION EXTREMITY Left 12/15/2019   Procedure: GASTROC RECESSION EXTREMITY;  Surgeon: Samara Deist, DPM;  Location: [facility] ORS;  Service: Podiatry;  Laterality: Left;   HALLUX VALGUS CHEILECTOMY Left 08/21/2016   Procedure: HALLUX VALGUS CHEILECTOMY;  Surgeon: Sharlotte Alamo, DPM;  Location: [facility] ORS;  Service: Podiatry;  Laterality: Left;   HEMORRHOID SURGERY  10/2014   SPINE SURGERY     TONSILLECTOMY AND ADENOIDECTOMY      Current Outpatient Medications  Medication Sig Dispense Refill   acetaminophen (TYLENOL) 500 MG tablet Take 500 mg by mouth every 6 (six) hours as needed for mild pain or headache.      albuterol (VENTOLIN HFA) 108 (90 Base) MCG/ACT inhaler Inhale 2 inhalations into the lungs every 6 (six) hours as needed for Wheezing 18 g 11   atorvastatin (LIPITOR) 40 MG tablet Take 1 tablet (40 mg total) by mouth once daily 30 tablet 11   cetirizine (ZYRTEC) 10 MG tablet Take 10 mg by mouth daily as needed (seasonal allergies.).      Cholecalciferol (VITAMIN D-3) 125 MCG (5000 UT) TABS Take 2 tablets by mouth daily.     clonazePAM (KLONOPIN) 0.5 MG tablet Take 0.5 mg by mouth daily as needed for anxiety.     Continuous Blood Gluc Receiver (FREESTYLE LIBRE 2 READER) DEVI Use to check glucose as directed 1 each 0   Continuous Blood Gluc Sensor (FREESTYLE LIBRE 2 SENSOR) MISC Change sensor every 14 days. 2 each 2   cyclobenzaprine (FLEXERIL) 5 MG tablet Take 1-2 tablets (5-10 mg total) by mouth 3 (three) times daily as needed for muscle pain 60 tablet 5   docusate sodium (COLACE) 100 MG capsule Take 1 capsule (100 mg total) by mouth 2 (two) times daily as needed for mild constipation. 20 capsule 0    doxycycline (VIBRAMYCIN) 100 MG capsule Take 1 capsule (100 mg total) by mouth 2 (two) times daily for 7 days 14 capsule 0   DULoxetine (CYMBALTA) 60 MG capsule Take 1 capsule (60 mg total) by mouth daily at 8 am 90 capsule 0   DULoxetine (CYMBALTA) 60 MG capsule Take 1 capsule (60 mg total) by mouth daily at 8AM. 90 capsule 0   fluticasone (FLOVENT HFA) 110 MCG/ACT inhaler Inhale 1 puff by mouth 2 (two) times daily 12 g 11   Fluticasone-Salmeterol (ADVAIR) 250-50 MCG/DOSE AEPB Inhale 1 puff into the lungs every 12 (twelve) hours as needed (seasonal allergies).  furosemide (LASIX) 40 MG tablet Take 1 tablet (40 mg total) by mouth daily as needed for edema 30 tablet 5   glucose blood (FREESTYLE LITE) test strip use to test blood glucose once daily as directed 100 each 0   hydrOXYzine (VISTARIL) 25 MG capsule Take 1 capsule (25 mg total) by mouth 2 (two) times daily as needed. 180 capsule 0   hydrOXYzine (VISTARIL) 25 MG capsule Take 1 capsule (25 mg total) by mouth 2 (two) times daily as needed. 180 capsule 0   hydrOXYzine (VISTARIL) 25 MG capsule Take 1 capsule (25 mg total) by mouth 2 (two) times daily as needed. 180 capsule 0   Lactase (LACTAID PO) Take by mouth.     lamoTRIgine (LAMICTAL) 200 MG tablet Take 1 tablet (200 mg total) by mouth daily. 90 tablet 0   lamoTRIgine (LAMICTAL) 25 MG tablet Take 2 tablets (50 mg total) by mouth nightly. 180 tablet 0   levalbuterol (XOPENEX HFA) 45 MCG/ACT inhaler Inhale 2 puffs into the lungs every 6 (six) hours as needed for wheezing 15 g 12   levocetirizine (XYZAL) 5 MG tablet Take 1 tablet by mouth in the evening. 30 tablet 11   levothyroxine (SYNTHROID) 50 MCG tablet Take 1 tablet (50 mcg total) by mouth daily. 90 tablet 1   metFORMIN (GLUCOPHAGE-XR) 500 MG 24 hr tablet Take 1 tablet (500 mg total) by mouth daily with breakfast. 90 tablet 1   metoCLOPramide (REGLAN) 10 MG tablet Take 1 tablet (10 mg total) by mouth 3 (three) times daily as needed 60  tablet 0   pantoprazole (PROTONIX) 40 MG tablet Take 1 tablet (40 mg total) by mouth 2 (two) times daily, 30 mins before breakfast and 30 mins before dinner. (Patient taking differently: Take by mouth 3 (three) times daily.) 60 tablet 3   progesterone (PROMETRIUM) 200 MG capsule Take 2 capsules (400 mg total) by mouth at bedtime. 180 capsule 3   spironolactone (ALDACTONE) 100 MG tablet Take 1 tablet (100 mg total) by mouth once daily. 90 tablet 3   tiZANidine (ZANAFLEX) 4 MG tablet Take 1 tablet (4 mg total) by mouth 3 (three) times daily as needed 20 tablet 1   triamcinolone cream (KENALOG) 0.1 % Apply topically 2 (two) times daily as needed.     tranexamic acid (LYSTEDA) 650 MG TABS tablet Take 2 tablets (1,300 mg total) by mouth 3 (three) times daily. Take for a maximum of 5 days during monthly menstruation. (Patient not taking: Reported on 09/25/2022) 30 tablet 3   No current facility-administered medications for this visit.   Allergies:  Liraglutide -weight management, Polysorbate, Sorbitan, Adhesive [tape], Latex, and Penicillins   Social History: The patient  reports that she quit smoking about 28 years ago. Her smoking use included cigarettes. She has never used smokeless tobacco. She reports current alcohol use. She reports that she does not use drugs.   Family History: The patient's family history includes Breast cancer (age of onset: 27) in her sister; Breast cancer (age of onset: 49) in her maternal aunt; Breast cancer (age of onset: 26) in her maternal grandmother; Cancer in her father; Diabetes in her father; Heart attack in her father; Hyperlipidemia in her father; Hypertension in her father; Obesity in her father; Osteoporosis in her mother; Sudden Cardiac Death in her father; Thyroid disease in her mother.   ROS:  Please see the history of present illness. Otherwise, complete review of systems is positive for none.  All other systems are reviewed  and negative.   Physical Exam: VS:   BP 116/78   Pulse 83   Ht '5\' 2"'$  (1.575 m)   Wt 233 lb (105.7 kg)   SpO2 97%   BMI 42.62 kg/m , BMI Body mass index is 42.62 kg/m.  Wt Readings from Last 3 Encounters:  09/25/22 233 lb (105.7 kg)  08/31/22 227 lb 6.4 oz (103.1 kg)  08/26/22 218 lb (98.9 kg)    General: Patient appears comfortable at rest. HEENT: Conjunctiva and lids normal, oropharynx clear with moist mucosa. Neck: Supple, no elevated JVP or carotid bruits, no thyromegaly. Lungs: Clear to auscultation, nonlabored breathing at rest. Cardiac: Regular rate and rhythm, no S3 or significant systolic murmur, no pericardial rub. Abdomen: Soft, nontender, no hepatomegaly, bowel sounds present, no guarding or rebound. Extremities: No pitting edema, distal pulses 2+. Skin: Warm and dry. Musculoskeletal: No kyphosis. Neuropsychiatric: Alert and oriented x3, affect grossly appropriate.  ECG:  NSR  Recent Labwork: 02/10/2022: ALT 21; AST 21; BUN 9; Creatinine, Ser 0.85; Potassium 4.5; Sodium 140 08/03/2022: TSH 1.155     Component Value Date/Time   CHOL 147 02/10/2022 0847   TRIG 181 (H) 02/10/2022 0847   HDL 46 02/10/2022 0847   CHOLHDL 3.8 04/08/2021 1659   VLDL 33 07/11/2015 0755   LDLCALC 71 02/10/2022 0847   LDLCALC 95 04/08/2021 1659    Other Studies Reviewed Today: Echo from 2019 LVEF preserved Diastolic function could not be assessed No valve abnormalities  NM stress test from 2019 No evidence of ischemia LVEF normal  Coronary CTA in 2020 Coronary calcium score is 0  Assessment and Plan: Patient is a 51 year old F known to have DM 2, OSA on CPAP presented to cardiology clinic for follow-up visit.  # Chest pain -Chest pain is exertional in nature occurring 2-3 times per week and lasting for 30 minutes.  This has been ongoing issue for the last several years.  She was initially on metoprolol with partial relief of symptoms and she is not on it anymore.  Start metoprolol tartrate 25 mg twice daily and  decrease the dose of spironolactone from 100 mg to 50 mg once daily.  Obtain cardiac PET scan to rule out coronary microvascular dysfunction.  If myocardial blood flow is normal, she will be discharged from the cardiology clinic.  # Palpitations -Unclear etiology of palpitations. It could be from stress/anxiety but she denied any.  Start metoprolol tartrate '25mg'$  twice daily (she was on it earlier).  # OSA on CPAP -Encouraged to be compliant with CPAP   I have spent a total of 33 minutes with patient reviewing chart , telemetry, EKGs, labs and examining patient as well as establishing an assessment and plan that was discussed with the patient.  > 50% of time was spent in direct patient care.     Medication Adjustments/Labs and Tests Ordered: Current medicines are reviewed at length with the patient today.  Concerns regarding medicines are outlined above.   Tests Ordered: Orders Placed This Encounter  Procedures   EKG 12-Lead    Medication Changes: No orders of the defined types were placed in this encounter.   Disposition:  Follow up  1 year  Signed, Miroslava Santellan Fidel Levy, MD, 09/25/2022 3:37 PM    [facility] [address] [place], [place] [address]

## 2022-09-25 NOTE — Patient Instructions (Addendum)
Medication Instructions:  Your physician has recommended you make the following change in your medication:  -Decrease Spironolactone to 50 mg tablets daily -Start Metoprolol Tartrate 25 mg tablets twice daily -Start SL Nitro 0.4 mg tablets as needed for chest pain   Labwork: None  Testing/Procedures: How to Prepare for Your Cardiac PET/CT Stress Test:  Hold METOPROLOL TARTRATE THE DAY OF YOUR TEST.  HOLD METFORMIN THE DAY OF YOUR TEST.   1. Please do not take these medications before your test:   Medications that may interfere with the cardiac pharmacological stress agent (ex. nitrates - including isosorbide mononitrate or beta-blockers) the day of the exam. Theophylline containing medications for 12 hours. Dipyridamole 48 hours prior to the test. Your remaining medications may be taken with water.  2. Nothing to eat or drink, except water, 3 hours prior to arrival time.   NO caffeine/decaffeinated products, or chocolate 12 hours prior to arrival.  3. NO perfume, cologne or lotion  4. Total time is 1 to 2 hours; you may want to bring reading material for the waiting time.  5. Please report to Admitting at the [facility] Main Entrance 30 minutes early for your test.  [place], [place] [address]  Diabetic Preparation:  Hold oral medications. You may take NPH and Lantus insulin. Do not take Humalog or Humulin R (Regular Insulin) the day of your test. Check blood sugars prior to leaving the house. If able to eat breakfast prior to 3 hour fasting, you may take all medications, including your insulin, Do not worry if you miss your breakfast dose of insulin - start at your next meal.  IF YOU THINK YOU MAY BE PREGNANT, OR ARE NURSING PLEASE INFORM THE TECHNOLOGIST.  In preparation for your appointment, medication and supplies will be purchased.  Appointment availability is limited, so if you need to cancel or reschedule, please call the Radiology  Department at [phone]  24 hours in advance to avoid a cancellation fee of $100.00  What to Expect After you Arrive:  Once you arrive and check in for your appointment, you will be taken to a preparation room within the Radiology Department.  A technologist or Nurse will obtain your medical history, verify that you are correctly prepped for the exam, and explain the procedure.  Afterwards,  an IV will be started in your arm and electrodes will be placed on your skin for EKG monitoring during the stress portion of the exam. Then you will be escorted to the PET/CT scanner.  There, staff will get you positioned on the scanner and obtain a blood pressure and EKG.  During the exam, you will continue to be connected to the EKG and blood pressure machines.  A small, safe amount of a radioactive tracer will be injected in your IV to obtain a series of pictures of your heart along with an injection of a stress agent.    After your Exam:  It is recommended that you eat a meal and drink a caffeinated beverage to counter act any effects of the stress agent.  Drink plenty of fluids for the remainder of the day and urinate frequently for the first couple of hours after the exam.  Your doctor will inform you of your test results within 7-10 business days.  For questions about your test or how to prepare for your test, please call: Marchia Bond, Cardiac Imaging Nurse Navigator  Gordy Clement, Cardiac Imaging Nurse Navigator Office: [phone]   Follow-Up:  Follow up with Dr Dellia Cloud in 1 year.   Any Other Special Instructions Will Be Listed Below (If Applicable).     If you need a refill on your cardiac medications before your next appointment, please call your pharmacy.

## 2022-09-26 ENCOUNTER — Other Ambulatory Visit (HOSPITAL_COMMUNITY): Payer: Self-pay

## 2022-09-30 ENCOUNTER — Other Ambulatory Visit (HOSPITAL_COMMUNITY): Payer: Self-pay

## 2022-09-30 ENCOUNTER — Other Ambulatory Visit: Payer: Self-pay

## 2022-09-30 MED ORDER — PREDNISONE 10 MG PO TABS
ORAL_TABLET | ORAL | 0 refills | Status: DC
  Filled 2022-09-30 (×2): qty 20, 8d supply, fill #0

## 2022-09-30 MED ORDER — AZITHROMYCIN 250 MG PO TABS
ORAL_TABLET | ORAL | 0 refills | Status: AC
  Filled 2022-09-30 (×2): qty 6, 5d supply, fill #0

## 2022-10-10 ENCOUNTER — Ambulatory Visit (INDEPENDENT_AMBULATORY_CARE_PROVIDER_SITE_OTHER): Payer: No Typology Code available for payment source

## 2022-10-10 ENCOUNTER — Ambulatory Visit
Admission: EM | Admit: 2022-10-10 | Discharge: 2022-10-10 | Disposition: A | Discharge status: Home / Self Care | Payer: No Typology Code available for payment source | Attending: Nurse Practitioner | Admitting: Nurse Practitioner

## 2022-10-10 DIAGNOSIS — J22 Unspecified acute lower respiratory infection: Secondary | ICD-10-CM

## 2022-10-10 DIAGNOSIS — R0602 Shortness of breath: Secondary | ICD-10-CM

## 2022-10-10 DIAGNOSIS — R062 Wheezing: Secondary | ICD-10-CM | POA: Diagnosis not present

## 2022-10-10 DIAGNOSIS — R059 Cough, unspecified: Secondary | ICD-10-CM | POA: Diagnosis not present

## 2022-10-10 MED ORDER — AZITHROMYCIN 250 MG PO TABS: 250.0000 mg | ORAL_TABLET | Freq: Every day | ORAL | 0 refills | Status: DC

## 2022-10-10 MED ORDER — PREDNISONE 20 MG PO TABS: 20.0000 mg | ORAL_TABLET | Freq: Every day | ORAL | 0 refills | Status: AC

## 2022-10-10 MED ORDER — ALBUTEROL SULFATE (2.5 MG/3ML) 0.083% IN NEBU
2.5000 mg | INHALATION_SOLUTION | Freq: Once | RESPIRATORY_TRACT | Status: AC
  Administered 2022-10-10: 2.5 mg via RESPIRATORY_TRACT

## 2022-10-10 MED ORDER — PSEUDOEPH-BROMPHEN-DM 30-2-10 MG/5ML PO SYRP: 5.0000 mL | ORAL_SOLUTION | Freq: Four times a day (QID) | ORAL | 0 refills | Status: DC | PRN

## 2022-10-10 NOTE — ED Triage Notes (Signed)
Pt reports cough and wheezing x 1 week. Pt finished prednisone and antibiotic 2 days agio without relief.

## 2022-10-10 NOTE — Discharge Instructions (Addendum)
The chest x-ray is negative for pneumonia. Take medication as prescribed. Increase fluids and allow for plenty of rest. Recommend using a humidifier at bedtime during sleep and sleeping elevated on pillows while cough symptoms persist. If you continue to experience wheezing, with worsening shortness of breath, difficulty breathing, or other concerns, please follow-up in the emergency department for further evaluation. If symptoms continue to persist despite this treatment, please follow-up with your primary care physician. Follow-up as needed.

## 2022-10-10 NOTE — ED Provider Notes (Signed)
[facility]    CSN: [id number] Arrival date & time: 10/10/22  0825      History   Chief Complaint Chief Complaint  Patient presents with   Cough    Wheezing, coughing - Entered by patient   Wheezing         HPI Heather Burnett is a 51 y.o. female.   The history is provided by the patient.   The patient presents for complaints of cough, wheezing, and shortness of breath that been present over the past week.  Patient reports that she did follow-up with her primary care physician and was prescribed Keflex and prednisone.  She reports that she took the medications, with the last dose approximately 2 days ago.  She states today she woke up with worsening wheezing, coughing, and shortness of breath.  She reports that she did reach out to her PCPs office, but she was informed that they were on vacation.  She denies new fever, chills, sore throat, headache, or GI symptoms.  She reports that she does not smoke, and has no history of asthma, she reports a history of seasonal allergies.  She also reports that she is diabetic.  Past Medical History:  Diagnosis Date   Allergy    Anxiety    Asthma    WELL CONTROLLED   Back pain    Cancer ([place])    melanoma on my chest   Chest pain    Constipation    COPD (chronic obstructive pulmonary disease) (HCC)    not sure about COPD    DDD (degenerative disc disease), cervical    and lower back   Depression    Diabetes mellitus, type II (HCC)    Diverticulitis    DJD (degenerative joint disease)    Dysrhythmia    Empty sella syndrome ([place])    followed at [facility]   Empty sella syndrome [facility])    Family history of adverse reaction to anesthesia    mother almost died with anesthesia in the past, hard to wake up and got real sick on my stomach vomiting, now she receives local anethesia.   GERD (gastroesophageal reflux disease)    Headache    history of migraine   Heart murmur    Hiatal hernia    History of methicillin resistant  staphylococcus aureus (MRSA)    History of palpitations 07/2004   Hyperlipidemia    Hypertension    Hypothyroidism    Hypothyroidism, adult 02/08/2020   Idiopathic peripheral neuropathy    Joint pain    Lactose intolerance    Lymphocytosis    Murmur    Neuropathy 2016   Osteoporosis    Palpitations    PCOS (polycystic ovarian syndrome)    Pituitary microadenoma ([place])    followed at [facility]   Pneumonia 2008   Pre-diabetes    Sleep apnea    USES CPAP   Swallowing difficulty    Tachycardia    Thyroid disease    Vitamin D deficiency    Von Willebrand disease ([place]) 2012   PT STATES SHE HAS NEVER HAD ANY ISSUES WITH BLEEDING    Patient Active Problem List   Diagnosis Date Noted   Palpitations 09/25/2022   Chest pain of uncertain etiology 00/92/3300   Diabetes mellitus ([place]) 08/03/2022   Gastroesophageal reflux disease 04/07/2022   Class 3 severe obesity with serious comorbidity and body mass index (BMI) of 40.0 to 44.9 in adult [facility]) 04/07/2022   Diabetes mellitus without complication ([place]) 76/22/6333  Empty sella syndrome ([place]) 03/16/2022   Pituitary microadenoma ([place]) 03/16/2022   Sleep apnea 03/16/2022   Von Willebrand's disease ([place]) 03/16/2022   Bilious vomiting with nausea 03/10/2022   Family history of colon cancer 03/10/2022   GERD (gastroesophageal reflux disease) 03/10/2022   Low TSH level 02/11/2022   Iatrogenic thyrotoxicosis 07/14/2021   Diabetes mellitus type 2 in obese ([place]) 07/14/2021   Hypothyroidism 02/08/2020   Idiopathic peripheral neuropathy 03/09/2018   Hypertension    Hematuria, microscopic 02/12/2017   Dyspnea on exertion 09/26/2015   Tachycardia 09/26/2015   Arthritis, degenerative 07/31/2015   HLD (hyperlipidemia) 07/31/2015   Blood glucose elevated 12/06/2014   Benign essential HTN 03/15/2014   Class 3 severe obesity due to excess calories with serious comorbidity and body mass index (BMI) of 40.0 to 44.9 in adult [facility]) 03/15/2014    Bilateral polycystic ovarian syndrome 03/15/2014    Past Surgical History:  Procedure Laterality Date   ACHILLES TENDON SURGERY Left 12/15/2019   Procedure: ACHILLES TENDON REPAIR PRIMARY;  Surgeon: Samara Deist, DPM;  Location: [facility] ORS;  Service: Podiatry;  Laterality: Left;   ANTERIOR CERVICAL DECOMP/DISCECTOMY FUSION N/A 10/02/2020   Procedure: ANTERIOR CERVICAL DECOMPRESSION/DISCECTOMY FUSION 1 LEVEL C6-7;  Surgeon: Meade Maw, MD;  Location: [facility] ORS;  Service: Neurosurgery;  Laterality: N/A;   BACK SURGERY     removal of 2 lumbar vertebrae   BREAST BIOPSY Right 2010   NEG   CHOLECYSTECTOMY     COLONOSCOPY  03/03/2013, 02/06/2008   COLONOSCOPY N/A 08/26/2022   Procedure: COLONOSCOPY;  Surgeon: Toledo, Benay Pike, MD;  Location: [facility] ENDOSCOPY;  Service: Gastroenterology;  Laterality: N/A;   COLONOSCOPY WITH PROPOFOL N/A 08/05/2018   Procedure: COLONOSCOPY WITH PROPOFOL;  Surgeon: Manya Silvas, MD;  Location: [facility] ENDOSCOPY;  Service: Endoscopy;  Laterality: N/A;   EGD  02/06/2008   ESOPHAGOGASTRODUODENOSCOPY N/A 06/10/2022   Procedure: ESOPHAGOGASTRODUODENOSCOPY (EGD);  Surgeon: Toledo, Benay Pike, MD;  Location: [facility] ENDOSCOPY;  Service: Gastroenterology;  Laterality: N/A;   GASTROC RECESSION EXTREMITY Left 12/15/2019   Procedure: GASTROC RECESSION EXTREMITY;  Surgeon: Samara Deist, DPM;  Location: [facility] ORS;  Service: Podiatry;  Laterality: Left;   HALLUX VALGUS CHEILECTOMY Left 08/21/2016   Procedure: HALLUX VALGUS CHEILECTOMY;  Surgeon: Sharlotte Alamo, DPM;  Location: [facility] ORS;  Service: Podiatry;  Laterality: Left;   HEMORRHOID SURGERY  10/2014   SPINE SURGERY     TONSILLECTOMY AND ADENOIDECTOMY      OB History     Gravida  1   Para  1   Term      Preterm      AB      Living         SAB      IAB      Ectopic      Multiple      Live Births               Home Medications    Prior to Admission medications   Medication Sig Start Date End Date  Taking? Authorizing Provider  azithromycin (ZITHROMAX) 250 MG tablet Take 1 tablet (250 mg total) by mouth daily. Take first 2 tablets together, then 1 every day until finished. 10/10/22  Yes Chukwuemeka Artola-Warren, Alda Lea, NP  brompheniramine-pseudoephedrine-DM 30-2-10 MG/5ML syrup Take 5 mLs by mouth 4 (four) times daily as needed. 10/10/22  Yes Roderick Sweezy-Warren, Alda Lea, NP  predniSONE (DELTASONE) 20 MG tablet Take 1 tablet (20 mg total) by mouth daily with breakfast for 5 days. 10/10/22  10/15/22 Yes Nitika Jackowski-Warren, Alda Lea, NP  acetaminophen (TYLENOL) 500 MG tablet Take 500 mg by mouth every 6 (six) hours as needed for mild pain or headache.     [provider]  albuterol (VENTOLIN HFA) 108 (90 Base) MCG/ACT inhaler Inhale 2 inhalations into the lungs every 6 (six) hours as needed for Wheezing 01/27/21     atorvastatin (LIPITOR) 40 MG tablet Take 1 tablet (40 mg total) by mouth once daily 04/09/22     cetirizine (ZYRTEC) 10 MG tablet Take 10 mg by mouth daily as needed (seasonal allergies.).     [provider]  Cholecalciferol (VITAMIN D-3) 125 MCG (5000 UT) TABS Take 2 tablets by mouth daily.    [provider]  clonazePAM (KLONOPIN) 0.5 MG tablet Take 0.5 mg by mouth daily as needed for anxiety. 07/30/20   [provider]  Continuous Blood Gluc Receiver (FREESTYLE LIBRE 2 READER) DEVI Use to check glucose as directed 08/06/22   Cassandria Anger, MD  Continuous Blood Gluc Sensor (FREESTYLE LIBRE 2 SENSOR) MISC Change sensor every 14 days. 08/06/22   Cassandria Anger, MD  cyclobenzaprine (FLEXERIL) 5 MG tablet Take 1-2 tablets (5-10 mg total) by mouth 3 (three) times daily as needed for muscle pain 09/03/22     docusate sodium (COLACE) 100 MG capsule Take 1 capsule (100 mg total) by mouth 2 (two) times daily as needed for mild constipation. 06/02/22   Everardo Pacific, FNP  doxycycline (VIBRAMYCIN) 100 MG capsule Take 1 capsule (100 mg total) by mouth 2  (two) times daily for 7 days 07/14/22     DULoxetine (CYMBALTA) 60 MG capsule Take 1 capsule (60 mg total) by mouth daily at 8 am 06/05/22     DULoxetine (CYMBALTA) 60 MG capsule Take 1 capsule (60 mg total) by mouth daily at 8AM. 08/14/22     fluticasone (FLOVENT HFA) 110 MCG/ACT inhaler Inhale 1 puff by mouth 2 (two) times daily 02/12/22     Fluticasone-Salmeterol (ADVAIR) 250-50 MCG/DOSE AEPB Inhale 1 puff into the lungs every 12 (twelve) hours as needed (seasonal allergies).     [provider]  furosemide (LASIX) 40 MG tablet Take 1 tablet (40 mg total) by mouth daily as needed for edema 06/24/22     glucose blood (FREESTYLE LITE) test strip use to test blood glucose once daily as directed 05/07/20     hydrOXYzine (VISTARIL) 25 MG capsule Take 1 capsule (25 mg total) by mouth 2 (two) times daily as needed. 05/08/22     hydrOXYzine (VISTARIL) 25 MG capsule Take 1 capsule (25 mg total) by mouth 2 (two) times daily as needed. 06/05/22     hydrOXYzine (VISTARIL) 25 MG capsule Take 1 capsule (25 mg total) by mouth 2 (two) times daily as needed. 08/14/22     Lactase (LACTAID PO) Take by mouth.    [provider]  lamoTRIgine (LAMICTAL) 200 MG tablet Take 1 tablet (200 mg total) by mouth daily. 08/14/22     lamoTRIgine (LAMICTAL) 25 MG tablet Take 2 tablets (50 mg total) by mouth nightly. 08/14/22     levalbuterol (XOPENEX HFA) 45 MCG/ACT inhaler Inhale 2 puffs into the lungs every 6 (six) hours as needed for wheezing 11/13/21     levocetirizine (XYZAL) 5 MG tablet Take 1 tablet by mouth in the evening. 02/06/22   Jaynee Eagles, PA-C  levothyroxine (SYNTHROID) 50 MCG tablet Take 1 tablet (50 mcg total) by mouth daily. 08/21/22   Brita Romp, NP  metFORMIN (GLUCOPHAGE-XR) 500 MG 24 hr tablet Take 1 tablet (500 mg total) by mouth daily with breakfast. 08/05/22   Nida, Marella Chimes, MD  metoCLOPramide (REGLAN) 10 MG tablet Take 1 tablet (10 mg total) by mouth 3 (three) times daily as  needed 05/06/22     metoprolol tartrate (LOPRESSOR) 25 MG tablet Take 1 tablet (25 mg total) by mouth 2 (two) times daily. 09/25/22 09/20/23  Mallipeddi, Vishnu P, MD  nitroGLYCERIN (NITROSTAT) 0.4 MG SL tablet Place 1 tablet under the tongue every 5 minutes as needed for chest pain (up to 3 tablets total)). 09/25/22 12/24/22  Mallipeddi, Vishnu P, MD  pantoprazole (PROTONIX) 40 MG tablet Take 1 tablet (40 mg total) by mouth 2 (two) times daily, 30 mins before breakfast and 30 mins before dinner. Patient taking differently: Take by mouth 3 (three) times daily. 03/10/22     progesterone (PROMETRIUM) 200 MG capsule Take 2 capsules (400 mg total) by mouth at bedtime. 10/31/21     spironolactone (ALDACTONE) 50 MG tablet Take 1 tablet (50 mg total) by mouth daily. 09/25/22   Mallipeddi, Vishnu P, MD  tiZANidine (ZANAFLEX) 4 MG tablet Take 1 tablet (4 mg total) by mouth 3 (three) times daily as needed 01/27/21     tranexamic acid (LYSTEDA) 650 MG TABS tablet Take 2 tablets (1,300 mg total) by mouth 3 (three) times daily. Take for a maximum of 5 days during monthly menstruation. Patient not taking: Reported on 09/25/2022 07/14/22     triamcinolone cream (KENALOG) 0.1 % Apply topically 2 (two) times daily as needed. 10/22/21   [provider]    Family History Family History  Problem Relation Age of Onset   Thyroid disease Mother    Osteoporosis Mother    Diabetes Father    Hyperlipidemia Father    Hypertension Father    Cancer Father    Heart attack Father    Sudden Cardiac Death Father    Obesity Father    Breast cancer Sister 97   Breast cancer Maternal Grandmother 18   Breast cancer Maternal Aunt 58    Social History Social History   Tobacco Use   Smoking status: Former    Years: 8.00    Types: Cigarettes    Quit date: 04/09/1994    Years since quitting: 28.5   Smokeless tobacco: Never  Vaping Use   Vaping Use: Never used  Substance Use Topics   Alcohol use: Yes    Comment: social    Drug use: No     Allergies   Liraglutide -weight management, Polysorbate, Sorbitan, Adhesive [tape], Latex, and Penicillins   Review of Systems Review of Systems Per HPI  Physical Exam Triage Vital Signs ED Triage Vitals  Enc Vitals Group     BP 10/10/22 1012 126/82     Pulse Rate 10/10/22 1012 91     Resp 10/10/22 1012 18     Temp 10/10/22 1012 (!) 97.5 F (36.4 C)     Temp Source 10/10/22 1012 Oral     SpO2 10/10/22 1012 96 %     Weight --      Height --      Head Circumference --      Peak Flow --      Pain Score 10/10/22 1013 0     Pain Loc --      Pain Edu? --      Excl. in [place]? --    No data found.  Updated Vital Signs BP  126/82 (BP Location: Right Arm)   Pulse 87   Temp (!) 97.5 F (36.4 C) (Oral)   Resp 18   LMP 09/13/2022 (Exact Date)   SpO2 95%   Visual Acuity Right Eye Distance:   Left Eye Distance:   Bilateral Distance:    Right Eye Near:   Left Eye Near:    Bilateral Near:     Physical Exam Vitals and nursing note reviewed.  Constitutional:      General: She is not in acute distress.    Appearance: Normal appearance.  HENT:     Head: Normocephalic.     Right Ear: Tympanic membrane, ear canal and external ear normal.     Left Ear: Tympanic membrane, ear canal and external ear normal.     Nose: Nose normal.     Mouth/Throat:     Mouth: Mucous membranes are moist.     Pharynx: Posterior oropharyngeal erythema present. No oropharyngeal exudate.  Eyes:     Extraocular Movements: Extraocular movements intact.     Pupils: Pupils are equal, round, and reactive to light.  Cardiovascular:     Rate and Rhythm: Normal rate and regular rhythm.     Pulses: Normal pulses.     Heart sounds: Normal heart sounds.  Pulmonary:     Effort: Pulmonary effort is normal.     Breath sounds: Decreased air movement present. Examination of the right-lower field reveals wheezing and rhonchi. Examination of the left-lower field reveals wheezing and rhonchi.  Wheezing and rhonchi present.     Comments: Expiratory wheezing noted throughout all lung fields, rhonchi is present.  Post nebulizer treatment, wheezing has decreased, breath sounds have improved throughout. Abdominal:     General: Bowel sounds are normal.     Palpations: Abdomen is soft.     Tenderness: There is no abdominal tenderness.  Musculoskeletal:     Cervical back: Normal range of motion.  Skin:    General: Skin is warm and dry.  Neurological:     General: No focal deficit present.     Mental Status: She is alert and oriented to person, place, and time.  Psychiatric:        Mood and Affect: Mood normal.        Behavior: Behavior normal.      UC Treatments / Results  Labs (all labs ordered are listed, but only abnormal results are displayed) Labs Reviewed - No data to display  EKG   Radiology DG Chest 2 View  Result Date: 10/10/2022 CLINICAL DATA:  Cough, wheezing and shortness of breath. History of pneumonia. EXAM: CHEST - 2 VIEW COMPARISON:  11/03/2021.  CT, 11/25/2018. FINDINGS: Normal heart, mediastinum and hila. Clear lungs.  No pleural effusion or pneumothorax. Skeletal structures are intact. IMPRESSION: No active cardiopulmonary disease. Electronically Signed   By: Lajean Manes M.D.   On: 10/10/2022 10:44    Procedures Procedures (including critical care time)  Medications Ordered in UC Medications  albuterol (PROVENTIL) (2.5 MG/3ML) 0.083% nebulizer solution 2.5 mg (2.5 mg Nebulization Given 10/10/22 1041)    Initial Impression / Assessment and Plan / UC Course  I have reviewed the triage vital signs and the nursing notes.  Pertinent labs & imaging results that were available during my care of the patient were reviewed by me and considered in my medical decision making (see chart for details).  The patient is well-appearing, she is in no acute distress, vital signs are stable.  Chest x-ray is negative for pneumonia.  Will cover patient empirically  with azithromycin 250 mg for lower respiratory infection.  For her continued bronchial inflammation, will start patient on prednisone 20 mg for the next 5 days, dosing adjusted given patient's history of diabetes.  For her cough, Bromfed was prescribed.  Supportive care recommendations were provided to the patient to include use of a humidifier at bedtime during sleep, and sleeping elevated on pillows.  Patient was advised to continue use of her albuterol inhaler.  Patient was given strict indications of when to go to the emergency department.  Patient advised that if symptoms fail to improve, recommend that she follow-up with her primary care physician for further evaluation.  Patient verbalizes understanding.  All questions were answered.  Patient is stable for discharge. Final Clinical Impressions(s) / UC Diagnoses   Final diagnoses:  Lower respiratory infection     Discharge Instructions      The chest x-ray is negative for pneumonia. Take medication as prescribed. Increase fluids and allow for plenty of rest. Recommend using a humidifier at bedtime during sleep and sleeping elevated on pillows while cough symptoms persist. If you continue to experience wheezing, with worsening shortness of breath, difficulty breathing, or other concerns, please follow-up in the emergency department for further evaluation. If symptoms continue to persist despite this treatment, please follow-up with your primary care physician. Follow-up as needed.     ED Prescriptions     Medication Sig Dispense Auth. Provider   azithromycin (ZITHROMAX) 250 MG tablet Take 1 tablet (250 mg total) by mouth daily. Take first 2 tablets together, then 1 every day until finished. 6 tablet Brendaliz Kuk-Warren, Alda Lea, NP   predniSONE (DELTASONE) 20 MG tablet Take 1 tablet (20 mg total) by mouth daily with breakfast for 5 days. 5 tablet Burna Atlas-Warren, Alda Lea, NP   brompheniramine-pseudoephedrine-DM 30-2-10 MG/5ML syrup Take 5  mLs by mouth 4 (four) times daily as needed. 140 mL Magen Suriano-Warren, Alda Lea, NP      PDMP not reviewed this encounter.   Tish Men, NP 10/10/22 1110

## 2022-10-13 ENCOUNTER — Other Ambulatory Visit (HOSPITAL_COMMUNITY): Payer: Self-pay

## 2022-10-15 ENCOUNTER — Other Ambulatory Visit: Payer: Self-pay

## 2022-10-15 ENCOUNTER — Other Ambulatory Visit (HOSPITAL_COMMUNITY): Payer: Self-pay

## 2022-10-23 ENCOUNTER — Other Ambulatory Visit (HOSPITAL_COMMUNITY): Payer: Self-pay

## 2022-10-24 ENCOUNTER — Other Ambulatory Visit (HOSPITAL_COMMUNITY): Payer: Self-pay

## 2022-11-03 ENCOUNTER — Other Ambulatory Visit
Admission: RE | Admit: 2022-11-03 | Discharge: 2022-11-03 | Disposition: A | Discharge status: Home / Self Care | Payer: 59 | Source: Ambulatory Visit | Attending: "Endocrinology | Admitting: "Endocrinology

## 2022-11-03 DIAGNOSIS — E782 Mixed hyperlipidemia: Secondary | ICD-10-CM | POA: Insufficient documentation

## 2022-11-03 DIAGNOSIS — E039 Hypothyroidism, unspecified: Secondary | ICD-10-CM | POA: Insufficient documentation

## 2022-11-03 LAB — LIPID PANEL
Cholesterol: 167 mg/dL (ref 0–200)
HDL: 46 mg/dL (ref >40)
LDL Cholesterol: 89 mg/dL (ref 0–99)
Total CHOL/HDL Ratio: 3.6 RATIO
Triglycerides: 162 mg/dL — ABNORMAL HIGH (ref <150)
VLDL: 32 mg/dL (ref 0–40)

## 2022-11-03 LAB — TSH: TSH: 0.796 u[IU]/mL (ref 0.350–4.500)

## 2022-11-03 LAB — T4, FREE: Free T4: 0.74 ng/dL (ref 0.61–1.12)

## 2022-11-04 ENCOUNTER — Other Ambulatory Visit: Payer: Self-pay

## 2022-11-04 ENCOUNTER — Other Ambulatory Visit (HOSPITAL_COMMUNITY): Payer: Self-pay

## 2022-11-04 MED ORDER — PROGESTERONE 200 MG PO CAPS
400.0000 mg | ORAL_CAPSULE | Freq: Every evening | ORAL | 3 refills | Status: DC
  Filled 2022-11-04 – 2022-11-25 (×2): qty 180, 90d supply, fill #0
  Filled 2023-03-02: qty 180, 90d supply, fill #1
  Filled 2023-06-06: qty 180, 90d supply, fill #2
  Filled 2023-08-27: qty 180, 90d supply, fill #3

## 2022-11-04 MED ORDER — METOCLOPRAMIDE HCL 10 MG PO TABS
10.0000 mg | ORAL_TABLET | Freq: Three times a day (TID) | ORAL | 0 refills | Status: DC | PRN
  Filled 2022-11-04: qty 60, 20d supply, fill #0

## 2022-11-05 ENCOUNTER — Encounter: Payer: Self-pay | Admitting: "Endocrinology

## 2022-11-05 ENCOUNTER — Ambulatory Visit (INDEPENDENT_AMBULATORY_CARE_PROVIDER_SITE_OTHER): Payer: 59 | Admitting: "Endocrinology

## 2022-11-05 ENCOUNTER — Other Ambulatory Visit (HOSPITAL_COMMUNITY): Payer: Self-pay

## 2022-11-05 VITALS — BP 118/80 | HR 68 | Ht 62.0 in | Wt 238.6 lb

## 2022-11-05 DIAGNOSIS — E039 Hypothyroidism, unspecified: Secondary | ICD-10-CM

## 2022-11-05 DIAGNOSIS — E119 Type 2 diabetes mellitus without complications: Secondary | ICD-10-CM

## 2022-11-05 DIAGNOSIS — E782 Mixed hyperlipidemia: Secondary | ICD-10-CM

## 2022-11-05 LAB — POCT GLYCOSYLATED HEMOGLOBIN (HGB A1C): HbA1c, POC (controlled diabetic range): 7.8 % — AB (ref 0.0–7.0)

## 2022-11-05 MED ORDER — METFORMIN HCL ER 500 MG PO TB24
1000.0000 mg | ORAL_TABLET | Freq: Every day | ORAL | 1 refills | Status: DC
  Filled 2022-11-05: qty 180, 90d supply, fill #0
  Filled 2023-01-27: qty 180, 90d supply, fill #1

## 2022-11-05 NOTE — Progress Notes (Signed)
11/05/2022, 6:23 PM   Endocrinology follow-up note  Subjective:    Patient ID: Heather Burnett, female    DOB: 25-Oct-1970, PCP Adin Hector, MD   Past Medical History:  Diagnosis Date   Allergy    Anxiety    Asthma    WELL CONTROLLED   Back pain    Cancer ([place])    melanoma on my chest   Chest pain    Constipation    COPD (chronic obstructive pulmonary disease) ([place])    not sure about COPD    DDD (degenerative disc disease), cervical    and lower back   Depression    Diabetes mellitus, type II ([place])    Diverticulitis    DJD (degenerative joint disease)    Dysrhythmia    Empty sella syndrome ([place])    followed at [facility]   Empty sella syndrome [facility])    Family history of adverse reaction to anesthesia    mother almost died with anesthesia in the past, hard to wake up and got real sick on my stomach vomiting, now she receives local anethesia.   GERD (gastroesophageal reflux disease)    Headache    history of migraine   Heart murmur    Hiatal hernia    History of methicillin resistant staphylococcus aureus (MRSA)    History of palpitations 07/2004   Hyperlipidemia    Hypertension    Hypothyroidism    Hypothyroidism, adult 02/08/2020   Idiopathic peripheral neuropathy    Joint pain    Lactose intolerance    Lymphocytosis    Murmur    Neuropathy 2016   Osteoporosis    Palpitations    PCOS (polycystic ovarian syndrome)    Pituitary microadenoma ([place])    followed at [facility]   Pneumonia 2008   Pre-diabetes    Sleep apnea    USES CPAP   Swallowing difficulty    Tachycardia    Thyroid disease    Vitamin D deficiency    Von Willebrand disease ([place]) 2012   PT STATES SHE HAS NEVER HAD ANY ISSUES WITH BLEEDING   Past Surgical History:  Procedure Laterality Date   ACHILLES TENDON SURGERY Left 12/15/2019   Procedure: ACHILLES TENDON REPAIR PRIMARY;  Surgeon: Samara Deist, DPM;  Location: [facility] ORS;  Service:  Podiatry;  Laterality: Left;   ANTERIOR CERVICAL DECOMP/DISCECTOMY FUSION N/A 10/02/2020   Procedure: ANTERIOR CERVICAL DECOMPRESSION/DISCECTOMY FUSION 1 LEVEL C6-7;  Surgeon: Meade Maw, MD;  Location: [facility] ORS;  Service: Neurosurgery;  Laterality: N/A;   BACK SURGERY     removal of 2 lumbar vertebrae   BREAST BIOPSY Right 2010   NEG   CHOLECYSTECTOMY     COLONOSCOPY  03/03/2013, 02/06/2008   COLONOSCOPY N/A 08/26/2022   Procedure: COLONOSCOPY;  Surgeon: Toledo, Benay Pike, MD;  Location: [facility] ENDOSCOPY;  Service: Gastroenterology;  Laterality: N/A;   COLONOSCOPY WITH PROPOFOL N/A 08/05/2018   Procedure: COLONOSCOPY WITH PROPOFOL;  Surgeon: Manya Silvas, MD;  Location: [facility] ENDOSCOPY;  Service: Endoscopy;  Laterality: N/A;   EGD  02/06/2008   ESOPHAGOGASTRODUODENOSCOPY N/A 06/10/2022   Procedure: ESOPHAGOGASTRODUODENOSCOPY (EGD);  Surgeon: Toledo, Benay Pike, MD;  Location: [facility] ENDOSCOPY;  Service: Gastroenterology;  Laterality: N/A;  GASTROC RECESSION EXTREMITY Left 12/15/2019   Procedure: GASTROC RECESSION EXTREMITY;  Surgeon: Samara Deist, DPM;  Location: [facility] ORS;  Service: Podiatry;  Laterality: Left;   HALLUX VALGUS CHEILECTOMY Left 08/21/2016   Procedure: HALLUX VALGUS CHEILECTOMY;  Surgeon: Sharlotte Alamo, DPM;  Location: [facility] ORS;  Service: Podiatry;  Laterality: Left;   HEMORRHOID SURGERY  10/2014   SPINE SURGERY     TONSILLECTOMY AND ADENOIDECTOMY     Social History   Socioeconomic History   Marital status: Divorced    Spouse name: Not on file   Number of children: Not on file   Years of education: Not on file   Highest education level: Not on file  Occupational History   Occupation: CMA [employer]  Tobacco Use   Smoking status: Former    Years: 8.00    Types: Cigarettes    Quit date: 04/09/1994    Years since quitting: 28.5   Smokeless tobacco: Never  Vaping Use   Vaping Use: Never used  Substance and Sexual Activity   Alcohol use: Yes    Comment: social   Drug  use: No   Sexual activity: Not on file  Other Topics Concern   Not on file  Social History Narrative   Divorced since 2009.Lives with 50 year old mum.CMA at [employer].   Social Determinants of Health   Financial Resource Strain: Not on file  Food Insecurity: Not on file  Transportation Needs: Not on file  Physical Activity: Not on file  Stress: Not on file  Social Connections: Not on file   Family History  Problem Relation Age of Onset   Thyroid disease Mother    Osteoporosis Mother    Diabetes Father    Hyperlipidemia Father    Hypertension Father    Cancer Father    Heart attack Father    Sudden Cardiac Death Father    Obesity Father    Breast cancer Sister 17   Breast cancer Maternal Grandmother 58   Breast cancer Maternal Aunt 66   Outpatient Encounter Medications as of 11/05/2022  Medication Sig   acetaminophen (TYLENOL) 500 MG tablet Take 500 mg by mouth every 6 (six) hours as needed for mild pain or headache.    albuterol (VENTOLIN HFA) 108 (90 Base) MCG/ACT inhaler Inhale 2 inhalations into the lungs every 6 (six) hours as needed for Wheezing   atorvastatin (LIPITOR) 40 MG tablet Take 1 tablet (40 mg total) by mouth once daily   azithromycin (ZITHROMAX) 250 MG tablet Take 1 tablet (250 mg total) by mouth daily. Take first 2 tablets together, then 1 every day until finished.   brompheniramine-pseudoephedrine-DM 30-2-10 MG/5ML syrup Take 5 mLs by mouth 4 (four) times daily as needed.   cetirizine (ZYRTEC) 10 MG tablet Take 10 mg by mouth daily as needed (seasonal allergies.).    Cholecalciferol (VITAMIN D-3) 125 MCG (5000 UT) TABS Take 2 tablets by mouth daily.   clonazePAM (KLONOPIN) 0.5 MG tablet Take 0.5 mg by mouth daily as needed for anxiety.   Continuous Blood Gluc Receiver (FREESTYLE LIBRE 2 READER) DEVI Use to check glucose as directed   Continuous Blood Gluc Sensor (FREESTYLE LIBRE 2 SENSOR) MISC Change sensor every 14 days.   cyclobenzaprine (FLEXERIL) 5  MG tablet Take 1-2 tablets (5-10 mg total) by mouth 3 (three) times daily as needed for muscle pain   docusate sodium (COLACE) 100 MG capsule Take 1 capsule (100 mg total) by mouth 2 (two) times daily as needed for mild  constipation.   doxycycline (VIBRAMYCIN) 100 MG capsule Take 1 capsule (100 mg total) by mouth 2 (two) times daily for 7 days   DULoxetine (CYMBALTA) 60 MG capsule Take 1 capsule (60 mg total) by mouth daily at 8 am   DULoxetine (CYMBALTA) 60 MG capsule Take 1 capsule (60 mg total) by mouth daily at 8AM.   fluticasone (FLOVENT HFA) 110 MCG/ACT inhaler Inhale 1 puff by mouth 2 (two) times daily   Fluticasone-Salmeterol (ADVAIR) 250-50 MCG/DOSE AEPB Inhale 1 puff into the lungs every 12 (twelve) hours as needed (seasonal allergies).    furosemide (LASIX) 40 MG tablet Take 1 tablet (40 mg total) by mouth daily as needed for edema   glucose blood (FREESTYLE LITE) test strip use to test blood glucose once daily as directed   hydrOXYzine (VISTARIL) 25 MG capsule Take 1 capsule (25 mg total) by mouth 2 (two) times daily as needed.   hydrOXYzine (VISTARIL) 25 MG capsule Take 1 capsule (25 mg total) by mouth 2 (two) times daily as needed.   hydrOXYzine (VISTARIL) 25 MG capsule Take 1 capsule (25 mg total) by mouth 2 (two) times daily as needed.   Lactase (LACTAID PO) Take by mouth.   lamoTRIgine (LAMICTAL) 200 MG tablet Take 1 tablet (200 mg total) by mouth daily.   lamoTRIgine (LAMICTAL) 25 MG tablet Take 2 tablets (50 mg total) by mouth nightly.   levalbuterol (XOPENEX HFA) 45 MCG/ACT inhaler Inhale 2 puffs into the lungs every 6 (six) hours as needed for wheezing   levocetirizine (XYZAL) 5 MG tablet Take 1 tablet by mouth in the evening.   levothyroxine (SYNTHROID) 50 MCG tablet Take 1 tablet (50 mcg total) by mouth daily.   metFORMIN (GLUMETZA) 1000 MG (MOD) 24 hr tablet Take 1 tablet (1,000 mg total) by mouth daily with breakfast.   metoCLOPramide (REGLAN) 10 MG tablet Take 1 tablet  (10 mg total) by mouth 3 (three) times daily as needed   metoprolol tartrate (LOPRESSOR) 25 MG tablet Take 1 tablet (25 mg total) by mouth 2 (two) times daily.   nitroGLYCERIN (NITROSTAT) 0.4 MG SL tablet Place 1 tablet under the tongue every 5 minutes as needed for chest pain (up to 3 tablets total). If no relief after 3 doses, call 911.   pantoprazole (PROTONIX) 40 MG tablet Take 1 tablet (40 mg total) by mouth 2 (two) times daily, 30 mins before breakfast and 30 mins before dinner. (Patient taking differently: Take by mouth 3 (three) times daily.)   progesterone (PROMETRIUM) 200 MG capsule Take 2 capsules (400 mg total) by mouth at bedtime.   spironolactone (ALDACTONE) 50 MG tablet Take 1 tablet (50 mg total) by mouth daily.   tiZANidine (ZANAFLEX) 4 MG tablet Take 1 tablet (4 mg total) by mouth 3 (three) times daily as needed   tranexamic acid (LYSTEDA) 650 MG TABS tablet Take 2 tablets (1,300 mg total) by mouth 3 (three) times daily. Take for a maximum of 5 days during monthly menstruation. (Patient not taking: Reported on 09/25/2022)   triamcinolone cream (KENALOG) 0.1 % Apply topically 2 (two) times daily as needed.   [DISCONTINUED] metFORMIN (GLUCOPHAGE-XR) 500 MG 24 hr tablet Take 1 tablet (500 mg total) by mouth daily with breakfast.   No facility-administered encounter medications on file as of 11/05/2022.   ALLERGIES: Allergies  Allergen Reactions   Liraglutide -Weight Management Other (See Comments)    GI upset   Polysorbate    Sorbitan Other (See Comments)    GI  upset   Adhesive [Tape] Itching and Rash    Paper tape or coban is easier on the skin.   Latex Rash   Penicillins Rash    Updated on 09/26/2020 following APP conversation with patient.  Swelling of the face/tongue/throat, SOB, or low BP? NO Sudden or severe rash/hives, skin peeling, or any reaction on the inside of your mouth or nose? NO - low severity rash to body.  Seek medical attention at a hospital or doctor's  office? Yes When did it last happen? Age 19 following "a shot of PCN".       If all above answers are "NO", may proceed with cephalosporin use. - Has taken cefdinir and cephalexin without issues.      VACCINATION STATUS: Immunization History  Administered Date(s) Administered   Influenza Split 08/02/2015, 07/19/2018   Influenza,inj,Quad PF,6+ Mos 07/22/2017   Influenza-Unspecified 06/19/2020   Tdap 11/12/2010, 04/08/2021    HPI OLANNA PERCIFIELD is 52 y.o. female who presents today for follow-up after she was seen in consultation for hypothyroidism, type 2 diabetes.   See notes from previous visit.  She was diagnosed with hypothyroidism at approximate age of 63 years.   She was treated with various dose of thyroid hormone formulations.  Due to problem stabilizing her thyroid function tests, her NP thyroid was switched to levothyroxine during her last visit.  She is currently on levothyroxine 50 mcg p.o. daily before breakfast.      She reports good consistency.  Her previsit thyroid function tests are consistent with appropriate replacement.    She has no new complaints.  Her previous symptoms of iatrogenic thyrotoxicosis have largely resolved.  She denies any exposure to thyroid iodine ablation nor thyroidectomy.   she denies dysphagia, shortness of breath nor voice change.  She has family history of various types of thyroid dysfunction, however no thyroid malignancy.  She did not have any thyroid imaging.  She could not tolerate Rybelsus which was tapered off after her last visit.  Her point-of-care A1c is 7.8, progressively increasing from 5.8%.  She is taking metformin 500 mg XR p.o. daily at breakfast.     Review of Systems  Constitutional:  + Fluctuating body weight, + fatigue, + subjective hyperthermia, no subjective hypothermia   Objective:       11/05/2022    3:52 PM 10/10/2022   10:53 AM 10/10/2022   10:12 AM  Vitals with BMI  Height '5\' 2"'$     Weight 238 lbs 10 oz     BMI 50.09    Systolic 381  829  Diastolic 80  82  Pulse 68 87 91    BP 118/80   Pulse 68   Ht '5\' 2"'$  (1.575 m)   Wt 238 lb 9.6 oz (108.2 kg)   LMP 09/13/2022 (Exact Date)   BMI 43.64 kg/m   Wt Readings from Last 3 Encounters:  11/05/22 238 lb 9.6 oz (108.2 kg)  09/25/22 233 lb (105.7 kg)  08/31/22 227 lb 6.4 oz (103.1 kg)    Physical Exam  Constitutional:  Body mass index is 43.64 kg/m.,  not in acute distress, normal state of mind Eyes: PERRLA, EOMI, no exophthalmos ENT: moist mucous membranes, no gross thyromegaly, no gross cervical lymphadenopathy   CMP ( most recent) CMP     Component Value Date/Time   NA 140 02/10/2022 0847   K 4.5 02/10/2022 0847   CL 100 02/10/2022 0847   CO2 25 02/10/2022 0847   GLUCOSE 124 (H) 02/10/2022  0847   GLUCOSE 102 (H) 09/05/2021 1318   BUN 9 02/10/2022 0847   CREATININE 0.85 02/10/2022 0847   CREATININE 0.79 04/08/2021 1659   CALCIUM 9.8 02/10/2022 0847   PROT 6.6 02/10/2022 0847   ALBUMIN 4.3 02/10/2022 0847   AST 21 02/10/2022 0847   ALT 21 02/10/2022 0847   ALKPHOS 62 02/10/2022 0847   BILITOT 0.4 02/10/2022 0847   GFRNONAA >60 09/05/2021 1318   GFRNONAA 89 04/08/2021 1659   GFRAA 103 04/08/2021 1659     Diabetic Labs (most recent): Lab Results  Component Value Date   HGBA1C 7.8 (A) 11/05/2022   HGBA1C 6.5 08/05/2022   HGBA1C 5.8 05/04/2022   MICROALBUR <0.2 01/06/2021   MICROALBUR 20.3 (H) 07/11/2015     Lipid Panel ( most recent) Lipid Panel     Component Value Date/Time   CHOL 167 11/03/2022 0808   CHOL 147 02/10/2022 0847   TRIG 162 (H) 11/03/2022 0808   HDL 46 11/03/2022 0808   HDL 46 02/10/2022 0847   CHOLHDL 3.6 11/03/2022 0808   VLDL 32 11/03/2022 0808   LDLCALC 89 11/03/2022 0808   LDLCALC 71 02/10/2022 0847   LDLCALC 95 04/08/2021 1659   LABVLDL 30 02/10/2022 0847      Lab Results  Component Value Date   TSH 0.796 11/03/2022   TSH 1.155 08/03/2022   TSH 0.073 (L) 05/01/2022   TSH  0.310 (L) 02/10/2022   TSH 0.741 10/22/2021   TSH <0.01 (L) 04/08/2021   TSH 1.137 07/21/2018   TSH 0.944 07/11/2015   TSH 1.191 04/30/2015   FREET4 0.74 11/03/2022   FREET4 0.62 08/03/2022   FREET4 0.86 05/01/2022   FREET4 1.37 02/10/2022   FREET4 0.83 10/22/2021    Labs from outside facilities: May 08, 2021 TSH less than 0.01, November 12, 2020 TSH less than 0.01, January 11, 2020 TSH 0.759, July 14, 2019 TSH less than 0.01.  Assessment & Plan:   1. Hypothyroidism 2.  Type 2 diabetes, controlled with A1c of 5.8%.  Her previsit thyroid function tests are consistent with appropriate replacement.  She is advised to continue levothyroxine 50 mcg p.o. daily before breakfast.    - We discussed about the correct intake of her thyroid hormone, on empty stomach at fasting, with water, separated by at least 30 minutes from breakfast and other medications,  and separated by more than 4 hours from calcium, iron, multivitamins, acid reflux medications (PPIs). -Patient is made aware of the fact that thyroid hormone replacement is needed for life, dose to be adjusted by periodic monitoring of thyroid function tests.   Her baseline thyroid ultrasound is unremarkable.  Regarding her type 2 diabetes with point-of-care A1c of 7.8% increasing from 5.8%.  She did not tolerate Rybelsus.  She will benefit from a higher dose of metformin.  I discussed and increase her metformin to 1000 mg XR p.o. daily at breakfast.    - she acknowledges that there is a room for improvement in her food and drink choices. - Suggestion is made for her to avoid simple carbohydrates  from her diet including Cakes, Sweet Desserts, Ice Cream, Soda (diet and regular), Sweet Tea, Candies, Chips, Cookies, Store Bought Juices, Alcohol , Artificial Sweeteners,  Coffee Creamer, and "Sugar-free" Products, Lemonade. This will help patient to have more stable blood glucose profile and potentially avoid unintended weight gain.  The  following Lifestyle Medicine recommendations according to [facility]) were discussed and and offered  to patient and she  agrees to start the journey:  A. Whole Foods, Plant-Based Nutrition comprising of fruits and vegetables, plant-based proteins, whole-grain carbohydrates was discussed in detail with the patient.   A list for source of those nutrients were also provided to the patient.  Patient will use only water or unsweetened tea for hydration. B.  The need to stay away from risky substances including alcohol, smoking; obtaining 7 to 9 hours of restorative sleep, at least 150 minutes of moderate intensity exercise weekly, the importance of healthy social connections,  and stress management techniques were discussed. C.  A full color page of  Calorie density of various food groups per pound showing examples of each food groups was provided to the patient.  She is advised to continue atorvastatin 40 mg p.o. nightly for hyperlipidemia.  Side effects and precautions discussed with her.  She is advised to maintain close follow-up with her PMD.   I spent 26 minutes in the care of the patient today including review of labs from Thyroid Function, CMP, and other relevant labs ; imaging/biopsy records (current and previous including abstractions from other facilities); face-to-face time discussing  her lab results and symptoms, medications doses, her options of short and long term treatment based on the latest standards of care / guidelines;   and documenting the encounter.  Heather Burnett  participated in the discussions, expressed understanding, and voiced agreement with the above plans.  All questions were answered to her satisfaction. she is encouraged to contact clinic should she have any questions or concerns prior to her return visit.   Follow up plan: Return in about 3 months (around 02/04/2023) for A1c -NV.   I spent 31 minutes in the care of the patient  today including review of labs from Thyroid Function, CMP, and other relevant labs ; imaging/biopsy records (current and previous including abstractions from other facilities); face-to-face time discussing  her lab results and symptoms, medications doses, her options of short and long term treatment based on the latest standards of care / guidelines;   and documenting the encounter.  Heather Burnett  participated in the discussions, expressed understanding, and voiced agreement with the above plans.  All questions were answered to her satisfaction. she is encouraged to contact clinic should she have any questions or concerns prior to her return visit.  Glade Lloyd, MD [facility] [address] [place], [place] [address] Phone: [phone]  Fax: [phone]     11/05/2022, 6:23 PM  This note was partially dictated with voice recognition software. Similar sounding words can be transcribed inadequately or may not  be corrected upon review.

## 2022-11-05 NOTE — Patient Instructions (Signed)

## 2022-11-06 ENCOUNTER — Other Ambulatory Visit: Payer: Self-pay

## 2022-11-06 ENCOUNTER — Other Ambulatory Visit (HOSPITAL_COMMUNITY): Payer: Self-pay

## 2022-11-24 ENCOUNTER — Telehealth (HOSPITAL_COMMUNITY): Payer: Self-pay | Admitting: *Deleted

## 2022-11-24 ENCOUNTER — Encounter: Payer: Self-pay | Admitting: "Endocrinology

## 2022-11-24 ENCOUNTER — Other Ambulatory Visit (HOSPITAL_COMMUNITY): Payer: Self-pay

## 2022-11-24 DIAGNOSIS — R079 Chest pain, unspecified: Secondary | ICD-10-CM

## 2022-11-24 NOTE — Telephone Encounter (Signed)
Stress consent placed for MD to review and sign.

## 2022-11-25 ENCOUNTER — Other Ambulatory Visit (HOSPITAL_COMMUNITY): Payer: Self-pay

## 2022-11-25 ENCOUNTER — Other Ambulatory Visit: Payer: Self-pay

## 2022-11-26 ENCOUNTER — Other Ambulatory Visit (HOSPITAL_COMMUNITY): Payer: Self-pay

## 2022-11-26 ENCOUNTER — Other Ambulatory Visit: Payer: Self-pay

## 2022-11-26 MED ORDER — DEXCOM G7 RECEIVER DEVI
0 refills | Status: AC
  Filled 2022-11-26 – 2022-12-03 (×2): qty 1, 30d supply, fill #0

## 2022-11-26 MED ORDER — DEXCOM G7 SENSOR MISC
2 refills | Status: DC
  Filled 2022-11-26 – 2022-12-03 (×2): qty 3, 30d supply, fill #0
  Filled 2023-01-13: qty 3, 30d supply, fill #1
  Filled 2023-02-15: qty 3, 30d supply, fill #2

## 2022-11-27 ENCOUNTER — Other Ambulatory Visit: Payer: Self-pay

## 2022-11-27 ENCOUNTER — Telehealth (HOSPITAL_COMMUNITY): Payer: Self-pay | Admitting: *Deleted

## 2022-11-27 ENCOUNTER — Other Ambulatory Visit (HOSPITAL_COMMUNITY): Payer: Self-pay

## 2022-11-27 NOTE — Telephone Encounter (Signed)
Reaching out to patient to offer assistance regarding upcoming cardiac imaging study; pt verbalizes understanding of appt date/time, parking situation and where to check in, pre-test NPO status and verified current allergies; name and call back number provided for further questions should they arise  Gordy Clement RN Navigator Cardiac Imaging [facility] [phone] office [phone] cell  Patient aware to avoid caffeine 12 hours prior to her cardiac PET scan.

## 2022-12-01 ENCOUNTER — Encounter (HOSPITAL_COMMUNITY)
Admission: RE | Admit: 2022-12-01 | Discharge: 2022-12-01 | Disposition: A | Discharge status: Home / Self Care | Payer: 59 | Source: Ambulatory Visit | Attending: Internal Medicine | Admitting: Internal Medicine

## 2022-12-01 DIAGNOSIS — I2585 Chronic coronary microvascular dysfunction: Secondary | ICD-10-CM | POA: Diagnosis present

## 2022-12-01 LAB — NM PET CT CARDIAC PERFUSION MULTI W/ABSOLUTE BLOODFLOW
LV dias vol: 62 mL (ref 46–106)
LV sys vol: 21 mL
MBFR: 3.19
Nuc Rest EF: 76 %
Nuc Stress EF: 66 %
Rest MBF: 1.35 ml/g/min
Rest Nuclear Isotope Dose: 28 mCi
ST Depression (mm): 0 mm
Stress MBF: 4.31 ml/g/min
Stress Nuclear Isotope Dose: 28 mCi

## 2022-12-01 MED ORDER — REGADENOSON 0.4 MG/5ML IV SOLN: 0.4000 mg | Freq: Once | INTRAVENOUS | Status: AC

## 2022-12-01 MED ORDER — RUBIDIUM RB82 GENERATOR (RUBYFILL)
25.0000 | PACK | Freq: Once | INTRAVENOUS | Status: AC
  Administered 2022-12-01: 28 via INTRAVENOUS

## 2022-12-01 MED ORDER — CAFFEINE CITRATE BASE COMPONENT 10 MG/ML IV SOLN
INTRAVENOUS | Status: AC
  Filled 2022-12-01: qty 3

## 2022-12-01 MED ORDER — DEXTROSE 5 % IV SOLN
INTRAVENOUS | Status: AC
  Filled 2022-12-01: qty 50

## 2022-12-01 MED ORDER — REGADENOSON 0.4 MG/5ML IV SOLN
INTRAVENOUS | Status: AC
  Administered 2022-12-01: 0.4 mg via INTRAVENOUS
  Filled 2022-12-01: qty 5

## 2022-12-01 NOTE — Progress Notes (Signed)
Tolerated test well

## 2022-12-03 ENCOUNTER — Other Ambulatory Visit (HOSPITAL_COMMUNITY): Payer: Self-pay

## 2022-12-03 ENCOUNTER — Other Ambulatory Visit: Payer: Self-pay

## 2022-12-04 DIAGNOSIS — R600 Localized edema: Secondary | ICD-10-CM | POA: Diagnosis not present

## 2022-12-04 DIAGNOSIS — R490 Dysphonia: Secondary | ICD-10-CM | POA: Diagnosis not present

## 2022-12-04 DIAGNOSIS — K219 Gastro-esophageal reflux disease without esophagitis: Secondary | ICD-10-CM | POA: Diagnosis not present

## 2022-12-06 DIAGNOSIS — G4733 Obstructive sleep apnea (adult) (pediatric): Secondary | ICD-10-CM | POA: Diagnosis not present

## 2022-12-07 ENCOUNTER — Other Ambulatory Visit (HOSPITAL_COMMUNITY): Payer: Self-pay | Admitting: Physician Assistant

## 2022-12-07 DIAGNOSIS — R600 Localized edema: Secondary | ICD-10-CM

## 2022-12-11 ENCOUNTER — Encounter: Payer: Self-pay | Admitting: "Endocrinology

## 2022-12-11 ENCOUNTER — Other Ambulatory Visit (HOSPITAL_COMMUNITY): Payer: Self-pay

## 2022-12-11 DIAGNOSIS — F5105 Insomnia due to other mental disorder: Secondary | ICD-10-CM | POA: Diagnosis not present

## 2022-12-11 DIAGNOSIS — F4312 Post-traumatic stress disorder, chronic: Secondary | ICD-10-CM | POA: Diagnosis not present

## 2022-12-11 DIAGNOSIS — F419 Anxiety disorder, unspecified: Secondary | ICD-10-CM | POA: Diagnosis not present

## 2022-12-11 DIAGNOSIS — F331 Major depressive disorder, recurrent, moderate: Secondary | ICD-10-CM | POA: Diagnosis not present

## 2022-12-11 MED ORDER — LAMOTRIGINE 25 MG PO TABS
50.0000 mg | ORAL_TABLET | Freq: Every evening | ORAL | 0 refills | Status: DC
  Filled 2022-12-11: qty 180, 90d supply, fill #0

## 2022-12-11 MED ORDER — HYDROXYZINE PAMOATE 25 MG PO CAPS
25.0000 mg | ORAL_CAPSULE | Freq: Two times a day (BID) | ORAL | 0 refills | Status: DC | PRN
  Filled 2022-12-11: qty 180, 90d supply, fill #0

## 2022-12-11 MED ORDER — LAMOTRIGINE 200 MG PO TABS
200.0000 mg | ORAL_TABLET | Freq: Every day | ORAL | 0 refills | Status: DC
  Filled 2022-12-11: qty 90, 90d supply, fill #0

## 2022-12-11 MED ORDER — DULOXETINE HCL 20 MG PO CPEP
40.0000 mg | ORAL_CAPSULE | Freq: Every day | ORAL | 0 refills | Status: DC
  Filled 2022-12-11: qty 60, 46d supply, fill #0

## 2022-12-16 ENCOUNTER — Other Ambulatory Visit: Payer: Self-pay

## 2022-12-22 ENCOUNTER — Other Ambulatory Visit: Payer: Self-pay

## 2022-12-22 MED ORDER — LORAZEPAM 0.5 MG PO TABS
0.5000 mg | ORAL_TABLET | Freq: Every day | ORAL | 0 refills | Status: DC | PRN
  Filled 2022-12-22: qty 10, 10d supply, fill #0

## 2023-01-04 DIAGNOSIS — G4733 Obstructive sleep apnea (adult) (pediatric): Secondary | ICD-10-CM | POA: Diagnosis not present

## 2023-01-13 ENCOUNTER — Other Ambulatory Visit (HOSPITAL_COMMUNITY): Payer: Self-pay

## 2023-01-25 ENCOUNTER — Ambulatory Visit (HOSPITAL_COMMUNITY)
Admission: RE | Admit: 2023-01-25 | Discharge: 2023-01-25 | Disposition: A | Discharge status: Home / Self Care | Payer: 59 | Source: Ambulatory Visit | Attending: Physician Assistant | Admitting: Physician Assistant

## 2023-01-25 DIAGNOSIS — R600 Localized edema: Secondary | ICD-10-CM | POA: Diagnosis not present

## 2023-01-25 DIAGNOSIS — R221 Localized swelling, mass and lump, neck: Secondary | ICD-10-CM | POA: Diagnosis not present

## 2023-01-25 MED ORDER — IOHEXOL 300 MG/ML  SOLN
75.0000 mL | Freq: Once | INTRAMUSCULAR | Status: AC | PRN
  Administered 2023-01-25: 75 mL via INTRAVENOUS

## 2023-01-27 ENCOUNTER — Other Ambulatory Visit (HOSPITAL_COMMUNITY): Payer: Self-pay

## 2023-01-28 ENCOUNTER — Other Ambulatory Visit (HOSPITAL_COMMUNITY): Payer: Self-pay

## 2023-01-28 ENCOUNTER — Other Ambulatory Visit: Payer: Self-pay

## 2023-01-28 MED ORDER — PANTOPRAZOLE SODIUM 40 MG PO TBEC
40.0000 mg | DELAYED_RELEASE_TABLET | Freq: Two times a day (BID) | ORAL | 3 refills | Status: DC
  Filled 2023-01-28: qty 60, 30d supply, fill #0
  Filled 2023-03-02: qty 60, 30d supply, fill #1
  Filled 2023-03-27: qty 60, 30d supply, fill #2
  Filled 2023-06-06: qty 60, 30d supply, fill #3

## 2023-01-28 MED ORDER — TIZANIDINE HCL 4 MG PO TABS
4.0000 mg | ORAL_TABLET | Freq: Three times a day (TID) | ORAL | 1 refills | Status: DC | PRN
  Filled 2023-01-28: qty 20, 7d supply, fill #0

## 2023-01-29 ENCOUNTER — Other Ambulatory Visit: Payer: Self-pay

## 2023-01-29 ENCOUNTER — Other Ambulatory Visit (HOSPITAL_COMMUNITY): Payer: Self-pay

## 2023-01-29 DIAGNOSIS — F5105 Insomnia due to other mental disorder: Secondary | ICD-10-CM | POA: Diagnosis not present

## 2023-01-29 DIAGNOSIS — F419 Anxiety disorder, unspecified: Secondary | ICD-10-CM | POA: Diagnosis not present

## 2023-01-29 DIAGNOSIS — F331 Major depressive disorder, recurrent, moderate: Secondary | ICD-10-CM | POA: Diagnosis not present

## 2023-01-29 DIAGNOSIS — F4312 Post-traumatic stress disorder, chronic: Secondary | ICD-10-CM | POA: Diagnosis not present

## 2023-01-29 LAB — POCT I-STAT CREATININE: Creatinine, Ser: 0.7 mg/dL (ref 0.44–1.00)

## 2023-01-29 MED ORDER — HYDROXYZINE PAMOATE 25 MG PO CAPS
25.0000 mg | ORAL_CAPSULE | Freq: Two times a day (BID) | ORAL | 0 refills | Status: DC | PRN
  Filled 2023-01-29: qty 180, 90d supply, fill #0

## 2023-01-29 MED ORDER — LAMOTRIGINE 200 MG PO TABS
200.0000 mg | ORAL_TABLET | Freq: Every day | ORAL | 0 refills | Status: DC
  Filled 2023-01-29 – 2023-03-27 (×2): qty 90, 90d supply, fill #0

## 2023-01-29 MED ORDER — LAMOTRIGINE 25 MG PO TABS
50.0000 mg | ORAL_TABLET | Freq: Every evening | ORAL | 0 refills | Status: DC
  Filled 2023-01-29 – 2023-03-27 (×2): qty 180, 90d supply, fill #0

## 2023-02-03 DIAGNOSIS — G4733 Obstructive sleep apnea (adult) (pediatric): Secondary | ICD-10-CM | POA: Diagnosis not present

## 2023-02-04 ENCOUNTER — Encounter: Payer: Self-pay | Admitting: "Endocrinology

## 2023-02-04 ENCOUNTER — Other Ambulatory Visit (HOSPITAL_COMMUNITY): Payer: Self-pay

## 2023-02-04 ENCOUNTER — Ambulatory Visit (INDEPENDENT_AMBULATORY_CARE_PROVIDER_SITE_OTHER): Payer: 59 | Admitting: "Endocrinology

## 2023-02-04 VITALS — BP 112/74 | HR 80 | Ht 62.0 in | Wt 250.8 lb

## 2023-02-04 DIAGNOSIS — Z79899 Other long term (current) drug therapy: Secondary | ICD-10-CM

## 2023-02-04 DIAGNOSIS — E119 Type 2 diabetes mellitus without complications: Secondary | ICD-10-CM

## 2023-02-04 DIAGNOSIS — Z6841 Body Mass Index (BMI) 40.0 and over, adult: Secondary | ICD-10-CM

## 2023-02-04 DIAGNOSIS — G4733 Obstructive sleep apnea (adult) (pediatric): Secondary | ICD-10-CM | POA: Diagnosis not present

## 2023-02-04 DIAGNOSIS — E039 Hypothyroidism, unspecified: Secondary | ICD-10-CM

## 2023-02-04 DIAGNOSIS — E782 Mixed hyperlipidemia: Secondary | ICD-10-CM

## 2023-02-04 LAB — POCT GLYCOSYLATED HEMOGLOBIN (HGB A1C): HbA1c, POC (controlled diabetic range): 8 % — AB (ref 0.0–7.0)

## 2023-02-04 MED ORDER — EMPAGLIFLOZIN 10 MG PO TABS
10.0000 mg | ORAL_TABLET | Freq: Every day | ORAL | 2 refills | Status: DC
  Filled 2023-02-04 – 2023-02-05 (×2): qty 30, 30d supply, fill #0
  Filled 2023-03-02: qty 30, 30d supply, fill #1
  Filled 2023-03-27: qty 30, 30d supply, fill #2

## 2023-02-04 NOTE — Progress Notes (Signed)
02/04/2023, 4:40 PM   Endocrinology follow-up note  Subjective:    Patient ID: Heather Burnett, female    DOB: 1971-05-22, PCP Lynnea Ferrier, MD   Past Medical History:  Diagnosis Date   Allergy    Anxiety    Asthma    WELL CONTROLLED   Back pain    Cancer    melanoma on my chest   Chest pain    Constipation    COPD (chronic obstructive pulmonary disease)    not sure about COPD    DDD (degenerative disc disease), cervical    and lower back   Depression    Diabetes mellitus, type II    Diverticulitis    DJD (degenerative joint disease)    Dysrhythmia    Empty sella syndrome    followed at [facility]   Empty sella syndrome    Family history of adverse reaction to anesthesia    mother almost died with anesthesia in the past, hard to wake up and got real sick on my stomach vomiting, now she receives local anethesia.   GERD (gastroesophageal reflux disease)    Headache    history of migraine   Heart murmur    Hiatal hernia    History of methicillin resistant staphylococcus aureus (MRSA)    History of palpitations 07/2004   Hyperlipidemia    Hypertension    Hypothyroidism    Hypothyroidism, adult 02/08/2020   Idiopathic peripheral neuropathy    Joint pain    Lactose intolerance    Lymphocytosis    Murmur    Neuropathy 2016   Osteoporosis    Palpitations    PCOS (polycystic ovarian syndrome)    Pituitary microadenoma    followed at [facility]   Pneumonia 2008   Pre-diabetes    Sleep apnea    USES CPAP   Swallowing difficulty    Tachycardia    Thyroid disease    Vitamin D deficiency    Von Willebrand disease 2012   PT STATES SHE HAS NEVER HAD ANY ISSUES WITH BLEEDING   Past Surgical History:  Procedure Laterality Date   ACHILLES TENDON SURGERY Left 12/15/2019   Procedure: ACHILLES TENDON REPAIR PRIMARY;  Surgeon: Gwyneth Revels, DPM;  Location: [facility] ORS;  Service: Podiatry;  Laterality: Left;   ANTERIOR  CERVICAL DECOMP/DISCECTOMY FUSION N/A 10/02/2020   Procedure: ANTERIOR CERVICAL DECOMPRESSION/DISCECTOMY FUSION 1 LEVEL C6-7;  Surgeon: Venetia Night, MD;  Location: [facility] ORS;  Service: Neurosurgery;  Laterality: N/A;   BACK SURGERY     removal of 2 lumbar vertebrae   BREAST BIOPSY Right 2010   NEG   CHOLECYSTECTOMY     COLONOSCOPY  03/03/2013, 02/06/2008   COLONOSCOPY N/A 08/26/2022   Procedure: COLONOSCOPY;  Surgeon: Toledo, Boykin Nearing, MD;  Location: [facility] ENDOSCOPY;  Service: Gastroenterology;  Laterality: N/A;   COLONOSCOPY WITH PROPOFOL N/A 08/05/2018   Procedure: COLONOSCOPY WITH PROPOFOL;  Surgeon: Scot Jun, MD;  Location: [facility];  Service: Endoscopy;  Laterality: N/A;   EGD  02/06/2008   ESOPHAGOGASTRODUODENOSCOPY N/A 06/10/2022   Procedure: ESOPHAGOGASTRODUODENOSCOPY (EGD);  Surgeon: Toledo, Boykin Nearing, MD;  Location: [facility] ENDOSCOPY;  Service: Gastroenterology;  Laterality: N/A;   GASTROC RECESSION EXTREMITY Left 12/15/2019  Procedure: GASTROC RECESSION EXTREMITY;  Surgeon: Gwyneth Revels, DPM;  Location: [facility] ORS;  Service: Podiatry;  Laterality: Left;   HALLUX VALGUS CHEILECTOMY Left 08/21/2016   Procedure: HALLUX VALGUS CHEILECTOMY;  Surgeon: Linus Galas, DPM;  Location: [facility] ORS;  Service: Podiatry;  Laterality: Left;   HEMORRHOID SURGERY  10/2014   SPINE SURGERY     TONSILLECTOMY AND ADENOIDECTOMY     Social History   Socioeconomic History   Marital status: Divorced    Spouse name: Not on file   Number of children: Not on file   Years of education: Not on file   Highest education level: Not on file  Occupational History   Occupation: CMA [employer]  Tobacco Use   Smoking status: Former    Years: 8    Types: Cigarettes    Quit date: 04/09/1994    Years since quitting: 28.8   Smokeless tobacco: Never  Vaping Use   Vaping Use: Never used  Substance and Sexual Activity   Alcohol use: Yes    Comment: social   Drug use: No   Sexual activity: Not on file   Other Topics Concern   Not on file  Social History Narrative   Divorced since 2009.Lives with 50 year old mum.CMA at [employer].   Social Determinants of Health   Financial Resource Strain: Not on file  Food Insecurity: Not on file  Transportation Needs: Not on file  Physical Activity: Not on file  Stress: Not on file  Social Connections: Not on file   Family History  Problem Relation Age of Onset   Thyroid disease Mother    Osteoporosis Mother    Diabetes Father    Hyperlipidemia Father    Hypertension Father    Cancer Father    Heart attack Father    Sudden Cardiac Death Father    Obesity Father    Breast cancer Sister 30   Breast cancer Maternal Grandmother 49   Breast cancer Maternal Aunt 17   Outpatient Encounter Medications as of 02/04/2023  Medication Sig   empagliflozin (JARDIANCE) 10 MG TABS tablet Take 1 tablet (10 mg total) by mouth daily before breakfast.   acetaminophen (TYLENOL) 500 MG tablet Take 500 mg by mouth every 6 (six) hours as needed for mild pain or headache.    albuterol (VENTOLIN HFA) 108 (90 Base) MCG/ACT inhaler Inhale 2 inhalations into the lungs every 6 (six) hours as needed for Wheezing   atorvastatin (LIPITOR) 40 MG tablet Take 1 tablet (40 mg total) by mouth once daily   cetirizine (ZYRTEC) 10 MG tablet Take 10 mg by mouth daily as needed (seasonal allergies.).    Cholecalciferol (VITAMIN D-3) 125 MCG (5000 UT) TABS Take 2 tablets by mouth daily.   clonazePAM (KLONOPIN) 0.5 MG tablet Take 0.5 mg by mouth daily as needed for anxiety.   Continuous Blood Gluc Receiver (DEXCOM G7 RECEIVER) DEVI Use to test Blood glucose 4 times daily   Continuous Blood Gluc Sensor (DEXCOM G7 SENSOR) MISC Change sensor every 10 days   doxycycline (VIBRAMYCIN) 100 MG capsule Take 1 capsule (100 mg total) by mouth 2 (two) times daily for 7 days   DULoxetine (CYMBALTA) 20 MG capsule Take 2 capsules by mouth daily for 2 weeks and then 1 capsule daily.    fluticasone (FLOVENT HFA) 110 MCG/ACT inhaler Inhale 1 puff by mouth 2 (two) times daily   Fluticasone-Salmeterol (ADVAIR) 250-50 MCG/DOSE AEPB Inhale 1 puff into the lungs every 12 (twelve) hours as needed (seasonal  allergies).    furosemide (LASIX) 40 MG tablet Take 1 tablet (40 mg total) by mouth daily as needed for edema   glucose blood (FREESTYLE LITE) test strip use to test blood glucose once daily as directed   hydrOXYzine (VISTARIL) 25 MG capsule Take 1 capsule (25 mg total) by mouth 2 (two) times daily as needed.   hydrOXYzine (VISTARIL) 25 MG capsule Take 1 capsule (25 mg total) by mouth 2 (two) times daily as needed.   hydrOXYzine (VISTARIL) 25 MG capsule Take 1 capsule (25 mg total) by mouth 2 (two) times daily as needed.   hydrOXYzine (VISTARIL) 25 MG capsule Take 1 capsule (25 mg total) by mouth 2 (two) times daily as needed.   hydrOXYzine (VISTARIL) 25 MG capsule Take 1 capsule (25 mg total) by mouth 2 (two) times daily as needed.   Lactase (LACTAID PO) Take by mouth.   lamoTRIgine (LAMICTAL) 200 MG tablet Take 1 tablet (200 mg total) by mouth daily.   lamoTRIgine (LAMICTAL) 200 MG tablet Take 1 tablet (200 mg total) by mouth daily.   lamoTRIgine (LAMICTAL) 25 MG tablet Take 2 tablets (50 mg total) by mouth nightly.   lamoTRIgine (LAMICTAL) 25 MG tablet Take 2 tablets (50 mg total) by mouth nightly.   levalbuterol (XOPENEX HFA) 45 MCG/ACT inhaler Inhale 2 puffs into the lungs every 6 (six) hours as needed for wheezing   levocetirizine (XYZAL) 5 MG tablet Take 1 tablet by mouth in the evening.   levothyroxine (SYNTHROID) 50 MCG tablet Take 1 tablet (50 mcg total) by mouth daily.   metFORMIN (GLUCOPHAGE-XR) 500 MG 24 hr tablet Take 2 tablets (1,000 mg total) by mouth daily with breakfast.   metoCLOPramide (REGLAN) 10 MG tablet Take 1 tablet (10 mg total) by mouth 3 (three) times daily as needed   metoprolol tartrate (LOPRESSOR) 25 MG tablet Take 1 tablet (25 mg total) by mouth 2 (two)  times daily.   nitroGLYCERIN (NITROSTAT) 0.4 MG SL tablet Place 1 tablet under the tongue every 5 minutes as needed for chest pain (up to 3 tablets total). If no relief after 3 doses, call 911.   pantoprazole (PROTONIX) 40 MG tablet Take 1 tablet (40 mg total) by mouth 2 (two) times daily, 30 mins before breakfast and 30 mins before dinner.   progesterone (PROMETRIUM) 200 MG capsule Take 2 capsules (400 mg total) by mouth at bedtime.   spironolactone (ALDACTONE) 50 MG tablet Take 1 tablet (50 mg total) by mouth daily.   tiZANidine (ZANAFLEX) 4 MG tablet Take 1 tablet (4 mg total) by mouth 3 (three) times daily as needed   triamcinolone cream (KENALOG) 0.1 % Apply topically 2 (two) times daily as needed.   [DISCONTINUED] azithromycin (ZITHROMAX) 250 MG tablet Take 1 tablet (250 mg total) by mouth daily. Take first 2 tablets together, then 1 every day until finished.   [DISCONTINUED] brompheniramine-pseudoephedrine-DM 30-2-10 MG/5ML syrup Take 5 mLs by mouth 4 (four) times daily as needed.   [DISCONTINUED] docusate sodium (COLACE) 100 MG capsule Take 1 capsule (100 mg total) by mouth 2 (two) times daily as needed for mild constipation.   [DISCONTINUED] DULoxetine (CYMBALTA) 60 MG capsule Take 1 capsule (60 mg total) by mouth daily at 8 am   [DISCONTINUED] DULoxetine (CYMBALTA) 60 MG capsule Take 1 capsule (60 mg total) by mouth daily at 8AM.   [DISCONTINUED] LORazepam (ATIVAN) 0.5 MG tablet Take 1 tablet (0.5 mg total) by mouth daily as needed.   [DISCONTINUED] tranexamic acid (LYSTEDA) 650  MG TABS tablet Take 2 tablets (1,300 mg total) by mouth 3 (three) times daily. Take for a maximum of 5 days during monthly menstruation. (Patient not taking: Reported on 09/25/2022)   No facility-administered encounter medications on file as of 02/04/2023.   ALLERGIES: Allergies  Allergen Reactions   Liraglutide -Weight Management Other (See Comments)    GI upset   Polysorbate    Sorbitan Other (See Comments)     GI upset   Adhesive [Tape] Itching and Rash    Paper tape or coban is easier on the skin.   Latex Rash   Penicillins Rash    Updated on 09/26/2020 following APP conversation with patient.  Swelling of the face/tongue/throat, SOB, or low BP? NO Sudden or severe rash/hives, skin peeling, or any reaction on the inside of your mouth or nose? NO - low severity rash to body.  Seek medical attention at a hospital or doctor's office? Yes When did it last happen? Age 48 following "a shot of PCN".       If all above answers are "NO", may proceed with cephalosporin use. - Has taken cefdinir and cephalexin without issues.      VACCINATION STATUS: Immunization History  Administered Date(s) Administered   Influenza Split 08/02/2015, 07/19/2018   Influenza,inj,Quad PF,6+ Mos 07/22/2017   Influenza-Unspecified 06/19/2020   Tdap 11/12/2010, 04/08/2021    HPI HAZLE OGBURN is 52 y.o. female who presents today for follow-up after she was seen in consultation for hypothyroidism, type 2 diabetes.   See notes from previous visit.  She was diagnosed with hypothyroidism at approximate age of 89 years.   She was treated with various doses of thyroid hormone over the years.  Due to problems stabilizing her thyroid function tests she was switched from NP thyroid to levothyroxine several visits ago.  She is not currently on levothyroxine 50 mcg p.o. daily before breakfast.  Her previsit thyroid function tests are consistent with appropriate replacement.     She reports good consistency.    Her previous symptoms of iatrogenic thyrotoxicosis have largely resolved.  She denies any exposure to thyroid iodine ablation nor thyroidectomy.   she denies dysphagia, shortness of breath nor voice change.  She has family history of various types of thyroid dysfunction, however no thyroid malignancy.  Her recent thyroid ultrasound was unremarkable. She has progressively lost control of her diabetes type 2.  Her  point-of-care A1c is 8% today.  She is currently on metformin 1000 mg once a day at breakfast.  Review of Systems  Constitutional:  + Fluctuating body weight, + fatigue, + subjective hyperthermia, no subjective hypothermia   Objective:       02/04/2023    3:05 PM 12/01/2022   11:27 AM 12/01/2022   11:26 AM  Vitals with BMI  Height     Weight 250 lbs 13 oz    BMI 45.86    Systolic 112 108 161  Diastolic 74 55 53  Pulse 80 95 100    BP 112/74   Pulse 80   Ht  (1.575 m)   Wt 250 lb 12.8 oz (113.8 kg)   BMI 45.87 kg/m   Wt Readings from Last 3 Encounters:  02/04/23 250 lb 12.8 oz (113.8 kg)  11/05/22 238 lb 9.6 oz (108.2 kg)  09/25/22 233 lb (105.7 kg)    Physical Exam  Constitutional:  Body mass index is 45.87 kg/m.,  not in acute distress, normal state of mind Eyes: PERRLA, EOMI, no  exophthalmos ENT: moist mucous membranes, no gross thyromegaly, no gross cervical lymphadenopathy   CMP ( most recent) CMP     Component Value Date/Time   NA 140 02/10/2022 0847   K 4.5 02/10/2022 0847   CL 100 02/10/2022 0847   CO2 25 02/10/2022 0847   GLUCOSE 124 (H) 02/10/2022 0847   GLUCOSE 102 (H) 09/05/2021 1318   BUN 9 02/10/2022 0847   CREATININE 0.70 01/25/2023 0825   CREATININE 0.79 04/08/2021 1659   CALCIUM 9.8 02/10/2022 0847   PROT 6.6 02/10/2022 0847   ALBUMIN 4.3 02/10/2022 0847   AST 21 02/10/2022 0847   ALT 21 02/10/2022 0847   ALKPHOS 62 02/10/2022 0847   BILITOT 0.4 02/10/2022 0847   GFRNONAA >60 09/05/2021 1318   GFRNONAA 89 04/08/2021 1659   GFRAA 103 04/08/2021 1659     Diabetic Labs (most recent): Lab Results  Component Value Date   HGBA1C 8.0 (A) 02/04/2023   HGBA1C 7.8 (A) 11/05/2022   HGBA1C 6.5 08/05/2022   MICROALBUR <0.2 01/06/2021   MICROALBUR 20.3 (H) 07/11/2015     Lipid Panel ( most recent) Lipid Panel     Component Value Date/Time   CHOL 167 11/03/2022 0808   CHOL 147 02/10/2022 0847   TRIG 162 (H) 11/03/2022 0808    HDL 46 11/03/2022 0808   HDL 46 02/10/2022 0847   CHOLHDL 3.6 11/03/2022 0808   VLDL 32 11/03/2022 0808   LDLCALC 89 11/03/2022 0808   LDLCALC 71 02/10/2022 0847   LDLCALC 95 04/08/2021 1659   LABVLDL 30 02/10/2022 0847      Lab Results  Component Value Date   TSH 0.796 11/03/2022   TSH 1.155 08/03/2022   TSH 0.073 (L) 05/01/2022   TSH 0.310 (L) 02/10/2022   TSH 0.741 10/22/2021   TSH <0.01 (L) 04/08/2021   TSH 1.137 07/21/2018   TSH 0.944 07/11/2015   TSH 1.191 04/30/2015   FREET4 0.74 11/03/2022   FREET4 0.62 08/03/2022   FREET4 0.86 05/01/2022   FREET4 1.37 02/10/2022   FREET4 0.83 10/22/2021      Thyroid ultrasound October 24, 2021  There is a small subcentimeter anechoic cystic nodule in the right mid thyroid lobe. This nodule does NOT meet TI-RADS criteria for biopsy or dedicated follow-up.   IMPRESSION: Normal size and sonographic appearance of the thyroid gland.   Assessment & Plan:   1. Hypothyroidism 2.  Type 2 diabetes  Her previsit thyroid function tests are consistent with appropriate replacement.  She is advised to continue levothyroxine 50 mcg p.o. daily before breakfast.    - We discussed about the correct intake of her thyroid hormone, on empty stomach at fasting, with water, separated by at least 30 minutes from breakfast and other medications,  and separated by more than 4 hours from calcium, iron, multivitamins, acid reflux medications (PPIs). -Patient is made aware of the fact that thyroid hormone replacement is needed for life, dose to be adjusted by periodic monitoring of thyroid function tests.  Her baseline thyroid ultrasound is unremarkable.  Regarding her type 2 diabetes with point-of-care A1c of 8%, progressively worsening.  She presents with her Dexcom CGM.  Analysis shows 29% time in range, 59% level 1 hyperglycemia, 4% level 2 hyperglycemia.  She did not document any hypoglycemia.  She did not tolerate Rybelsus.  She is currently on  metformin 1000 mg p.o. daily at breakfast.  She did not engage optimally with lifestyle medicine.  I discussed and added Jardiance 10 mg  p.o. daily at breakfast.  Side effects and precautions discussed with her.    -In light of her polypharmacy, even with partial engagement, she would benefit the most from lifestyle medicine.  - Suggestion is made for her to avoid simple carbohydrates  from her diet including Cakes, Sweet Desserts, Ice Cream, Soda (diet and regular), Sweet Tea, Candies, Chips, Cookies, Store Bought Juices, Alcohol , Artificial Sweeteners,  Coffee Creamer, and "Sugar-free" Products, Lemonade. This will help patient to have more stable blood glucose profile and potentially avoid unintended weight gain.  The following Lifestyle Medicine recommendations according to American College of Lifestyle Medicine  [facility]) were discussed and and offered to patient and she  agrees to start the journey:  A. Whole Foods, Plant-Based Nutrition comprising of fruits and vegetables, plant-based proteins, whole-grain carbohydrates was discussed in detail with the patient.   A list for source of those nutrients were also provided to the patient.  Patient will use only water or unsweetened tea for hydration. B.  The need to stay away from risky substances including alcohol, smoking; obtaining 7 to 9 hours of restorative sleep, at least 150 minutes of moderate intensity exercise weekly, the importance of healthy social connections,  and stress management techniques were discussed. C.  A full color page of  Calorie density of various food groups per pound showing examples of each food groups was provided to the patient.  She is advised to continue atorvastatin 40 mg p.o. nightly for hyperlipidemia.  Side effects and precautions discussed with her.  She is advised to maintain close follow-up with her PMD.   I spent  26  minutes in the care of the patient today including review of labs from Thyroid Function,  CMP, and other relevant labs ; imaging/biopsy records (current and previous including abstractions from other facilities); face-to-face time discussing  her lab results and symptoms, medications doses, her options of short and long term treatment based on the latest standards of care / guidelines;   and documenting the encounter.  Heather Burnett  participated in the discussions, expressed understanding, and voiced agreement with the above plans.  All questions were answered to her satisfaction. she is encouraged to contact clinic should she have any questions or concerns prior to her return visit.   Follow up plan: Return in about 3 months (around 05/06/2023) for F/U with Pre-visit Labs, Meter/CGM/Logs, A1c here.   Marquis Lunch, MD [facility] [address] [place], [place] [address] Phone: [phone]  Fax: [phone]     02/04/2023, 4:40 PM  This note was partially dictated with voice recognition software. Similar sounding words can be transcribed inadequately or may not  be corrected upon review.

## 2023-02-04 NOTE — Patient Instructions (Signed)

## 2023-02-05 ENCOUNTER — Other Ambulatory Visit (HOSPITAL_COMMUNITY): Payer: Self-pay

## 2023-02-05 ENCOUNTER — Other Ambulatory Visit: Payer: Self-pay

## 2023-02-05 DIAGNOSIS — Z79899 Other long term (current) drug therapy: Secondary | ICD-10-CM | POA: Insufficient documentation

## 2023-02-05 MED ORDER — OMRON 3 SERIES BP MONITOR DEVI
0 refills | Status: DC
  Filled 2023-02-05: qty 1, 90d supply, fill #0

## 2023-02-15 ENCOUNTER — Other Ambulatory Visit: Payer: Self-pay

## 2023-02-15 ENCOUNTER — Other Ambulatory Visit (HOSPITAL_COMMUNITY): Payer: Self-pay

## 2023-02-15 MED ORDER — CYCLOBENZAPRINE HCL 5 MG PO TABS
5.0000 mg | ORAL_TABLET | Freq: Three times a day (TID) | ORAL | 5 refills | Status: DC | PRN
  Filled 2023-02-15: qty 120, 20d supply, fill #0
  Filled 2023-03-27: qty 120, 20d supply, fill #1
  Filled 2023-06-30: qty 120, 40d supply, fill #2

## 2023-02-16 DIAGNOSIS — E7849 Other hyperlipidemia: Secondary | ICD-10-CM | POA: Diagnosis not present

## 2023-02-16 DIAGNOSIS — E118 Type 2 diabetes mellitus with unspecified complications: Secondary | ICD-10-CM | POA: Diagnosis not present

## 2023-02-19 DIAGNOSIS — Z6841 Body Mass Index (BMI) 40.0 and over, adult: Secondary | ICD-10-CM | POA: Diagnosis not present

## 2023-02-19 DIAGNOSIS — R000 Tachycardia, unspecified: Secondary | ICD-10-CM | POA: Diagnosis not present

## 2023-02-19 DIAGNOSIS — D6800 Von Willebrand disease, unspecified: Secondary | ICD-10-CM | POA: Diagnosis not present

## 2023-02-19 DIAGNOSIS — R3129 Other microscopic hematuria: Secondary | ICD-10-CM | POA: Diagnosis not present

## 2023-02-19 DIAGNOSIS — E282 Polycystic ovarian syndrome: Secondary | ICD-10-CM | POA: Diagnosis not present

## 2023-02-19 DIAGNOSIS — E236 Other disorders of pituitary gland: Secondary | ICD-10-CM | POA: Diagnosis not present

## 2023-02-19 DIAGNOSIS — E118 Type 2 diabetes mellitus with unspecified complications: Secondary | ICD-10-CM | POA: Diagnosis not present

## 2023-02-19 DIAGNOSIS — I10 Essential (primary) hypertension: Secondary | ICD-10-CM | POA: Diagnosis not present

## 2023-02-19 DIAGNOSIS — D352 Benign neoplasm of pituitary gland: Secondary | ICD-10-CM | POA: Diagnosis not present

## 2023-02-19 DIAGNOSIS — G4733 Obstructive sleep apnea (adult) (pediatric): Secondary | ICD-10-CM | POA: Diagnosis not present

## 2023-03-02 ENCOUNTER — Other Ambulatory Visit: Payer: Self-pay

## 2023-03-02 ENCOUNTER — Other Ambulatory Visit (HOSPITAL_COMMUNITY): Payer: Self-pay

## 2023-03-05 DIAGNOSIS — G4733 Obstructive sleep apnea (adult) (pediatric): Secondary | ICD-10-CM | POA: Diagnosis not present

## 2023-03-11 ENCOUNTER — Other Ambulatory Visit (HOSPITAL_COMMUNITY): Payer: Self-pay

## 2023-03-27 ENCOUNTER — Other Ambulatory Visit (HOSPITAL_COMMUNITY): Payer: Self-pay

## 2023-03-27 ENCOUNTER — Other Ambulatory Visit: Payer: Self-pay | Admitting: "Endocrinology

## 2023-03-29 ENCOUNTER — Other Ambulatory Visit: Payer: Self-pay

## 2023-03-29 ENCOUNTER — Other Ambulatory Visit (HOSPITAL_COMMUNITY): Payer: Self-pay

## 2023-03-30 ENCOUNTER — Other Ambulatory Visit: Payer: Self-pay

## 2023-03-30 ENCOUNTER — Other Ambulatory Visit (HOSPITAL_COMMUNITY): Payer: Self-pay

## 2023-03-30 MED ORDER — DEXCOM G7 SENSOR MISC
2 refills | Status: DC
  Filled 2023-03-30: qty 3, 30d supply, fill #0

## 2023-04-02 DIAGNOSIS — F419 Anxiety disorder, unspecified: Secondary | ICD-10-CM | POA: Diagnosis not present

## 2023-04-02 DIAGNOSIS — F5105 Insomnia due to other mental disorder: Secondary | ICD-10-CM | POA: Diagnosis not present

## 2023-04-02 DIAGNOSIS — F331 Major depressive disorder, recurrent, moderate: Secondary | ICD-10-CM | POA: Diagnosis not present

## 2023-04-02 DIAGNOSIS — F4312 Post-traumatic stress disorder, chronic: Secondary | ICD-10-CM | POA: Diagnosis not present

## 2023-04-03 ENCOUNTER — Other Ambulatory Visit (HOSPITAL_COMMUNITY): Payer: Self-pay

## 2023-04-03 MED ORDER — LAMOTRIGINE 25 MG PO TABS
50.0000 mg | ORAL_TABLET | Freq: Every day | ORAL | 0 refills | Status: DC
  Filled 2023-04-03 – 2023-06-30 (×3): qty 180, 90d supply, fill #0

## 2023-04-03 MED ORDER — HYDROXYZINE PAMOATE 25 MG PO CAPS
25.0000 mg | ORAL_CAPSULE | Freq: Two times a day (BID) | ORAL | 0 refills | Status: DC | PRN
  Filled 2023-04-03: qty 180, 90d supply, fill #0

## 2023-04-03 MED ORDER — LORAZEPAM 0.5 MG PO TABS
0.5000 mg | ORAL_TABLET | Freq: Every day | ORAL | 2 refills | Status: DC | PRN
  Filled 2023-04-03: qty 10, 10d supply, fill #0

## 2023-04-03 MED ORDER — LAMOTRIGINE 200 MG PO TABS
200.0000 mg | ORAL_TABLET | Freq: Every day | ORAL | 0 refills | Status: DC
  Filled 2023-04-03 – 2023-06-30 (×2): qty 90, 90d supply, fill #0

## 2023-04-05 ENCOUNTER — Other Ambulatory Visit: Payer: Self-pay

## 2023-04-05 ENCOUNTER — Other Ambulatory Visit (HOSPITAL_COMMUNITY): Payer: Self-pay

## 2023-04-05 DIAGNOSIS — G4733 Obstructive sleep apnea (adult) (pediatric): Secondary | ICD-10-CM | POA: Diagnosis not present

## 2023-04-26 ENCOUNTER — Other Ambulatory Visit (HOSPITAL_COMMUNITY): Payer: Self-pay

## 2023-04-26 DIAGNOSIS — M205X2 Other deformities of toe(s) (acquired), left foot: Secondary | ICD-10-CM | POA: Diagnosis not present

## 2023-04-26 DIAGNOSIS — M7671 Peroneal tendinitis, right leg: Secondary | ICD-10-CM | POA: Diagnosis not present

## 2023-04-26 DIAGNOSIS — M79671 Pain in right foot: Secondary | ICD-10-CM | POA: Diagnosis not present

## 2023-04-26 DIAGNOSIS — M7662 Achilles tendinitis, left leg: Secondary | ICD-10-CM | POA: Diagnosis not present

## 2023-04-26 DIAGNOSIS — M722 Plantar fascial fibromatosis: Secondary | ICD-10-CM | POA: Diagnosis not present

## 2023-04-26 DIAGNOSIS — M79672 Pain in left foot: Secondary | ICD-10-CM | POA: Diagnosis not present

## 2023-04-26 MED ORDER — ATORVASTATIN CALCIUM 40 MG PO TABS
40.0000 mg | ORAL_TABLET | Freq: Every day | ORAL | 11 refills | Status: DC
  Filled 2023-04-26: qty 30, 30d supply, fill #0
  Filled 2023-05-30: qty 90, 90d supply, fill #1
  Filled 2023-06-06 – 2023-08-27 (×2): qty 90, 90d supply, fill #2
  Filled 2023-11-20: qty 90, 90d supply, fill #3
  Filled 2024-02-28: qty 60, 60d supply, fill #4

## 2023-04-27 ENCOUNTER — Other Ambulatory Visit: Payer: Self-pay

## 2023-04-28 ENCOUNTER — Other Ambulatory Visit (HOSPITAL_COMMUNITY): Payer: Self-pay

## 2023-04-28 ENCOUNTER — Other Ambulatory Visit: Payer: Self-pay | Admitting: "Endocrinology

## 2023-04-28 ENCOUNTER — Encounter: Payer: Self-pay | Admitting: "Endocrinology

## 2023-04-28 DIAGNOSIS — E119 Type 2 diabetes mellitus without complications: Secondary | ICD-10-CM

## 2023-04-29 ENCOUNTER — Other Ambulatory Visit (HOSPITAL_COMMUNITY): Payer: Self-pay

## 2023-04-29 ENCOUNTER — Other Ambulatory Visit: Payer: Self-pay

## 2023-04-29 ENCOUNTER — Other Ambulatory Visit
Admission: RE | Admit: 2023-04-29 | Discharge: 2023-04-29 | Disposition: A | Discharge status: Home / Self Care | Payer: 59 | Attending: "Endocrinology | Admitting: "Endocrinology

## 2023-04-29 DIAGNOSIS — E119 Type 2 diabetes mellitus without complications: Secondary | ICD-10-CM | POA: Diagnosis not present

## 2023-04-29 LAB — LIPID PANEL
Cholesterol: 141 mg/dL (ref 0–200)
HDL: 32 mg/dL — ABNORMAL LOW (ref >40)
LDL Cholesterol: 66 mg/dL (ref 0–99)
Total CHOL/HDL Ratio: 4.4 RATIO
Triglycerides: 213 mg/dL — ABNORMAL HIGH (ref <150)
VLDL: 43 mg/dL — ABNORMAL HIGH (ref 0–40)

## 2023-04-29 LAB — TSH: TSH: 1.068 u[IU]/mL (ref 0.350–4.500)

## 2023-04-29 LAB — HEMOGLOBIN A1C
Hgb A1c MFr Bld: 7.2 % — ABNORMAL HIGH (ref 4.8–5.6)
Mean Plasma Glucose: 160 mg/dL

## 2023-04-29 LAB — COMPREHENSIVE METABOLIC PANEL
ALT: 16 U/L (ref 0–44)
AST: 18 U/L (ref 15–41)
Albumin: 3.9 g/dL (ref 3.5–5.0)
Alkaline Phosphatase: 59 U/L (ref 38–126)
Anion gap: 10 (ref 5–15)
BUN: 18 mg/dL (ref 6–20)
CO2: 24 mmol/L (ref 22–32)
Calcium: 9.2 mg/dL (ref 8.9–10.3)
Chloride: 104 mmol/L (ref 98–111)
Creatinine, Ser: 0.79 mg/dL (ref 0.44–1.00)
GFR, Estimated: >60 mL/min (ref >60)
Glucose, Bld: 136 mg/dL — ABNORMAL HIGH (ref 70–99)
Potassium: 4.1 mmol/L (ref 3.5–5.1)
Sodium: 138 mmol/L (ref 135–145)
Total Bilirubin: 0.5 mg/dL (ref 0.3–1.2)
Total Protein: 6.9 g/dL (ref 6.5–8.1)

## 2023-04-29 LAB — T4, FREE: Free T4: 0.87 ng/dL (ref 0.61–1.12)

## 2023-04-29 MED ORDER — VALACYCLOVIR HCL 500 MG PO TABS
500.0000 mg | ORAL_TABLET | Freq: Two times a day (BID) | ORAL | 5 refills | Status: DC | PRN
  Filled 2023-04-29: qty 12, 6d supply, fill #0

## 2023-04-29 MED ORDER — NITROFURANTOIN MONOHYD MACRO 100 MG PO CAPS
100.0000 mg | ORAL_CAPSULE | Freq: Two times a day (BID) | ORAL | 0 refills | Status: AC
  Filled 2023-04-29: qty 10, 5d supply, fill #0

## 2023-05-04 DIAGNOSIS — G4733 Obstructive sleep apnea (adult) (pediatric): Secondary | ICD-10-CM | POA: Diagnosis not present

## 2023-05-05 ENCOUNTER — Other Ambulatory Visit (HOSPITAL_COMMUNITY): Payer: Self-pay

## 2023-05-05 DIAGNOSIS — N898 Other specified noninflammatory disorders of vagina: Secondary | ICD-10-CM | POA: Diagnosis not present

## 2023-05-05 DIAGNOSIS — Z113 Encounter for screening for infections with a predominantly sexual mode of transmission: Secondary | ICD-10-CM | POA: Diagnosis not present

## 2023-05-05 DIAGNOSIS — N907 Vulvar cyst: Secondary | ICD-10-CM | POA: Diagnosis not present

## 2023-05-05 MED ORDER — FLUCONAZOLE 150 MG PO TABS
150.0000 mg | ORAL_TABLET | ORAL | 0 refills | Status: DC
  Filled 2023-05-05: qty 4, 14d supply, fill #0

## 2023-05-06 ENCOUNTER — Encounter: Payer: Self-pay | Admitting: Pharmacist

## 2023-05-06 ENCOUNTER — Ambulatory Visit (INDEPENDENT_AMBULATORY_CARE_PROVIDER_SITE_OTHER): Payer: 59 | Admitting: "Endocrinology

## 2023-05-06 ENCOUNTER — Encounter: Payer: Self-pay | Admitting: "Endocrinology

## 2023-05-06 ENCOUNTER — Other Ambulatory Visit: Payer: Self-pay

## 2023-05-06 VITALS — BP 124/86 | HR 100 | Ht 62.0 in | Wt 237.6 lb

## 2023-05-06 DIAGNOSIS — Z794 Long term (current) use of insulin: Secondary | ICD-10-CM | POA: Diagnosis not present

## 2023-05-06 DIAGNOSIS — E039 Hypothyroidism, unspecified: Secondary | ICD-10-CM

## 2023-05-06 DIAGNOSIS — E782 Mixed hyperlipidemia: Secondary | ICD-10-CM | POA: Diagnosis not present

## 2023-05-06 DIAGNOSIS — Z6841 Body Mass Index (BMI) 40.0 and over, adult: Secondary | ICD-10-CM | POA: Diagnosis not present

## 2023-05-06 DIAGNOSIS — E119 Type 2 diabetes mellitus without complications: Secondary | ICD-10-CM

## 2023-05-06 MED ORDER — EMPAGLIFLOZIN 25 MG PO TABS
25.0000 mg | ORAL_TABLET | Freq: Every day | ORAL | 1 refills | Status: DC
  Filled 2023-05-06: qty 90, 90d supply, fill #0
  Filled 2023-06-06 – 2023-07-31 (×2): qty 90, 90d supply, fill #1

## 2023-05-06 NOTE — Patient Instructions (Signed)

## 2023-05-06 NOTE — Progress Notes (Signed)
05/06/2023, 6:02 PM   Endocrinology follow-up note  Subjective:    Patient ID: Heather Burnett, female    DOB: 05/19/1971, PCP Lynnea Ferrier, MD   Past Medical History:  Diagnosis Date   Allergy    Anxiety    Asthma    WELL CONTROLLED   Back pain    Cancer (HCC)    melanoma on my chest   Chest pain    Constipation    COPD (chronic obstructive pulmonary disease) (HCC)    not sure about COPD    DDD (degenerative disc disease), cervical    and lower back   Depression    Diabetes mellitus, type II (HCC)    Diverticulitis    DJD (degenerative joint disease)    Dysrhythmia    Empty sella syndrome (HCC)    followed at [facility]   Empty sella syndrome [facility])    Family history of adverse reaction to anesthesia    mother almost died with anesthesia in the past, hard to wake up and got real sick on my stomach vomiting, now she receives local anethesia.   GERD (gastroesophageal reflux disease)    Headache    history of migraine   Heart murmur    Hiatal hernia    History of methicillin resistant staphylococcus aureus (MRSA)    History of palpitations 07/2004   Hyperlipidemia    Hypertension    Hypothyroidism    Hypothyroidism, adult 02/08/2020   Idiopathic peripheral neuropathy    Joint pain    Lactose intolerance    Lymphocytosis    Murmur    Neuropathy 2016   Osteoporosis    Palpitations    PCOS (polycystic ovarian syndrome)    Pituitary microadenoma (HCC)    followed at [facility]   Pneumonia 2008   Pre-diabetes    Sleep apnea    USES CPAP   Swallowing difficulty    Tachycardia    Thyroid disease    Vitamin D deficiency    Von Willebrand disease (HCC) 2012   PT STATES SHE HAS NEVER HAD ANY ISSUES WITH BLEEDING   Past Surgical History:  Procedure Laterality Date   ACHILLES TENDON SURGERY Left 12/15/2019   Procedure: ACHILLES TENDON REPAIR PRIMARY;  Surgeon: Gwyneth Revels, DPM;  Location: [facility] ORS;  Service:  Podiatry;  Laterality: Left;   ANTERIOR CERVICAL DECOMP/DISCECTOMY FUSION N/A 10/02/2020   Procedure: ANTERIOR CERVICAL DECOMPRESSION/DISCECTOMY FUSION 1 LEVEL C6-7;  Surgeon: Venetia Night, MD;  Location: [facility] ORS;  Service: Neurosurgery;  Laterality: N/A;   BACK SURGERY     removal of 2 lumbar vertebrae   BREAST BIOPSY Right 2010   NEG   CHOLECYSTECTOMY     COLONOSCOPY  03/03/2013, 02/06/2008   COLONOSCOPY N/A 08/26/2022   Procedure: COLONOSCOPY;  Surgeon: Toledo, Boykin Nearing, MD;  Location: [facility] ENDOSCOPY;  Service: Gastroenterology;  Laterality: N/A;   COLONOSCOPY WITH PROPOFOL N/A 08/05/2018   Procedure: COLONOSCOPY WITH PROPOFOL;  Surgeon: Scot Jun, MD;  Location: [facility] ENDOSCOPY;  Service: Endoscopy;  Laterality: N/A;   EGD  02/06/2008   ESOPHAGOGASTRODUODENOSCOPY N/A 06/10/2022   Procedure: ESOPHAGOGASTRODUODENOSCOPY (EGD);  Surgeon: Toledo, Boykin Nearing, MD;  Location: [facility] ENDOSCOPY;  Service: Gastroenterology;  Laterality: N/A;  GASTROC RECESSION EXTREMITY Left 12/15/2019   Procedure: GASTROC RECESSION EXTREMITY;  Surgeon: Gwyneth Revels, DPM;  Location: [facility] ORS;  Service: Podiatry;  Laterality: Left;   HALLUX VALGUS CHEILECTOMY Left 08/21/2016   Procedure: HALLUX VALGUS CHEILECTOMY;  Surgeon: Linus Galas, DPM;  Location: [facility] ORS;  Service: Podiatry;  Laterality: Left;   HEMORRHOID SURGERY  10/2014   SPINE SURGERY     TONSILLECTOMY AND ADENOIDECTOMY     Social History   Socioeconomic History   Marital status: Divorced    Spouse name: Not on file   Number of children: Not on file   Years of education: Not on file   Highest education level: Not on file  Occupational History   Occupation: CMA [employer]  Tobacco Use   Smoking status: Former    Current packs/day: 0.00    Types: Cigarettes    Start date: 04/09/1986    Quit date: 04/09/1994    Years since quitting: 29.0   Smokeless tobacco: Never  Vaping Use   Vaping status: Never Used  Substance and Sexual Activity    Alcohol use: Yes    Comment: social   Drug use: No   Sexual activity: Not on file  Other Topics Concern   Not on file  Social History Narrative   Divorced since 2009.Lives with 50 year old mum.CMA at [employer].   Social Determinants of Health   Financial Resource Strain: Not on file  Food Insecurity: Not on file  Transportation Needs: Not on file  Physical Activity: Not on file  Stress: Not on file  Social Connections: Not on file   Family History  Problem Relation Age of Onset   Thyroid disease Mother    Osteoporosis Mother    Diabetes Father    Hyperlipidemia Father    Hypertension Father    Cancer Father    Heart attack Father    Sudden Cardiac Death Father    Obesity Father    Breast cancer Sister 22   Breast cancer Maternal Grandmother 69   Breast cancer Maternal Aunt 63   Outpatient Encounter Medications as of 05/06/2023  Medication Sig   acetaminophen (TYLENOL) 500 MG tablet Take 500 mg by mouth every 6 (six) hours as needed for mild pain or headache.    albuterol (VENTOLIN HFA) 108 (90 Base) MCG/ACT inhaler Inhale 2 inhalations into the lungs every 6 (six) hours as needed for Wheezing   atorvastatin (LIPITOR) 40 MG tablet Take 1 tablet (40 mg total) by mouth once daily   Blood Pressure Monitoring (OMRON 3 SERIES BP MONITOR) DEVI use as directed   cetirizine (ZYRTEC) 10 MG tablet Take 10 mg by mouth daily as needed (seasonal allergies.).    Cholecalciferol (VITAMIN D-3) 125 MCG (5000 UT) TABS Take 2 tablets by mouth daily.   clonazePAM (KLONOPIN) 0.5 MG tablet Take 0.5 mg by mouth daily as needed for anxiety.   Continuous Blood Gluc Receiver (DEXCOM G7 RECEIVER) DEVI Use to test Blood glucose 4 times daily   Continuous Glucose Sensor (DEXCOM G7 SENSOR) MISC Change sensor every 10 days   cyclobenzaprine (FLEXERIL) 5 MG tablet Take 1-2 tablets (5-10 mg total) by mouth 3 (three) times daily as needed for muscle pain.   DULoxetine (CYMBALTA) 20 MG capsule Take 2  capsules by mouth daily for 2 weeks and then 1 capsule daily.   empagliflozin (JARDIANCE) 25 MG TABS tablet Take 1 tablet (25 mg total) by mouth daily before breakfast.   fluconazole (DIFLUCAN) 150 MG tablet Take  1 tablet (150 mg total) by mouth 2 (two) times a week for 2 weeks.   fluticasone (FLOVENT HFA) 110 MCG/ACT inhaler Inhale 1 puff by mouth 2 (two) times daily   Fluticasone-Salmeterol (ADVAIR) 250-50 MCG/DOSE AEPB Inhale 1 puff into the lungs every 12 (twelve) hours as needed (seasonal allergies).    furosemide (LASIX) 40 MG tablet Take 1 tablet (40 mg total) by mouth daily as needed for edema   glucose blood (FREESTYLE LITE) test strip use to test blood glucose once daily as directed   hydrOXYzine (VISTARIL) 25 MG capsule Take 1 capsule (25 mg total) by mouth 2 (two) times daily as needed.   hydrOXYzine (VISTARIL) 25 MG capsule Take 1 capsule (25 mg total) by mouth 2 (two) times daily as needed.   hydrOXYzine (VISTARIL) 25 MG capsule Take 1 capsule (25 mg total) by mouth 2 (two) times daily as needed.   hydrOXYzine (VISTARIL) 25 MG capsule Take 1 capsule (25 mg total) by mouth 2 (two) times daily as needed.   hydrOXYzine (VISTARIL) 25 MG capsule Take 1 capsule (25 mg total) by mouth 2 (two) times daily as needed.   hydrOXYzine (VISTARIL) 25 MG capsule Take 1 capsule (25 mg total) by mouth 2 (two) times daily as needed.   Lactase (LACTAID PO) Take by mouth.   lamoTRIgine (LAMICTAL) 200 MG tablet Take 1 tablet (200 mg total) by mouth daily.   lamoTRIgine (LAMICTAL) 200 MG tablet Take 1 tablet (200 mg total) by mouth daily.   lamoTRIgine (LAMICTAL) 25 MG tablet Take 2 tablets (50 mg total) by mouth nightly.   lamoTRIgine (LAMICTAL) 25 MG tablet Take 2 tablets (50 mg total) by mouth at bedtime.   levalbuterol (XOPENEX HFA) 45 MCG/ACT inhaler Inhale 2 puffs into the lungs every 6 (six) hours as needed for wheezing   levocetirizine (XYZAL) 5 MG tablet Take 1 tablet by mouth in the evening.    levothyroxine (SYNTHROID) 50 MCG tablet Take 1 tablet (50 mcg total) by mouth daily.   LORazepam (ATIVAN) 0.5 MG tablet Take 1 tablet (0.5 mg total) by mouth daily as needed.   metFORMIN (GLUCOPHAGE-XR) 500 MG 24 hr tablet Take 2 tablets (1,000 mg total) by mouth daily with breakfast.   metoprolol tartrate (LOPRESSOR) 25 MG tablet Take 1 tablet (25 mg total) by mouth 2 (two) times daily.   nitroGLYCERIN (NITROSTAT) 0.4 MG SL tablet Place 1 tablet under the tongue every 5 minutes as needed for chest pain (up to 3 tablets total). If no relief after 3 doses, call 911.   pantoprazole (PROTONIX) 40 MG tablet Take 1 tablet (40 mg total) by mouth 2 (two) times daily, 30 mins before breakfast and 30 mins before dinner.   progesterone (PROMETRIUM) 200 MG capsule Take 2 capsules (400 mg total) by mouth at bedtime.   spironolactone (ALDACTONE) 50 MG tablet Take 1 tablet (50 mg total) by mouth daily.   triamcinolone cream (KENALOG) 0.1 % Apply topically 2 (two) times daily as needed.   [DISCONTINUED] doxycycline (VIBRAMYCIN) 100 MG capsule Take 1 capsule (100 mg total) by mouth 2 (two) times daily for 7 days   [DISCONTINUED] empagliflozin (JARDIANCE) 10 MG TABS tablet Take 1 tablet (10 mg total) by mouth daily before breakfast.   [DISCONTINUED] metoCLOPramide (REGLAN) 10 MG tablet Take 1 tablet (10 mg total) by mouth 3 (three) times daily as needed   [DISCONTINUED] valACYclovir (VALTREX) 500 MG tablet Take 1 tablet (500 mg total) by mouth 2 (two) times daily for 3  days as needed for flare.   No facility-administered encounter medications on file as of 05/06/2023.   ALLERGIES: Allergies  Allergen Reactions   Liraglutide -Weight Management Other (See Comments)    GI upset   Polysorbate    Sorbitan Other (See Comments)    GI upset   Adhesive [Tape] Itching and Rash    Paper tape or coban is easier on the skin.   Latex Rash   Penicillins Rash    Updated on 09/26/2020 following APP conversation with  patient.  Swelling of the face/tongue/throat, SOB, or low BP? NO Sudden or severe rash/hives, skin peeling, or any reaction on the inside of your mouth or nose? NO - low severity rash to body.  Seek medical attention at a hospital or doctor's office? Yes When did it last happen? Age 20 following "a shot of PCN".       If all above answers are "NO", may proceed with cephalosporin use. - Has taken cefdinir and cephalexin without issues.      VACCINATION STATUS: Immunization History  Administered Date(s) Administered   Influenza Split 08/02/2015, 07/19/2018   Influenza,inj,Quad PF,6+ Mos 07/22/2017   Influenza-Unspecified 06/19/2020   Tdap 11/12/2010, 04/08/2021    HPI Heather Burnett is 53 y.o. female who presents today for follow-up after she was seen in consultation for hypothyroidism, type 2 diabetes.   See notes from previous visit.  She was diagnosed with hypothyroidism at approximate age of 64 years.   She was treated with various doses of thyroid hormone over the years.  Due to problems stabilizing her thyroid function tests she was switched from NP thyroid to levothyroxine several visits ago.  She is not currently on levothyroxine 50 mcg p.o. daily before breakfast.  Her previsit thyroid function tests are consistent with appropriate replacement.     She reports good consistency.    Her previous symptoms of iatrogenic thyrotoxicosis have largely resolved.  She denies any exposure to thyroid iodine ablation nor thyroidectomy.   she denies dysphagia, shortness of breath nor voice change.  She has family history of various types of thyroid dysfunction, however no thyroid malignancy.  Her recent thyroid ultrasound was unremarkable.  She was progressively losing control her type 2 diabetes, she was put on Jardiance 10 mg p.o. daily at breakfast along with her metformin 1000 mg p.o. daily at breakfast.  She presents with good response with point-of-care A1c was 7.2% and also 13 pounds  overall.   Review of Systems  Constitutional:  + Fluctuating body weight, + fatigue, + subjective hyperthermia, no subjective hypothermia   Objective:       05/06/2023    3:10 PM 02/04/2023    3:05 PM 12/01/2022   11:27 AM  Vitals with BMI  Height 5\' 2"  5\' 2"    Weight 237 lbs 10 oz 250 lbs 13 oz   BMI 43.45 45.86   Systolic 124 112 191  Diastolic 86 74 55  Pulse 100 80 95    BP 124/86   Pulse 100   Ht 5\' 2"  (1.575 m)   Wt 237 lb 9.6 oz (107.8 kg)   BMI 43.46 kg/m   Wt Readings from Last 3 Encounters:  05/06/23 237 lb 9.6 oz (107.8 kg)  02/04/23 250 lb 12.8 oz (113.8 kg)  11/05/22 238 lb 9.6 oz (108.2 kg)    Physical Exam  Constitutional:  Body mass index is 43.46 kg/m.,  not in acute distress, normal state of mind Eyes: PERRLA, EOMI, no exophthalmos  ENT: moist mucous membranes, no gross thyromegaly, no gross cervical lymphadenopathy   CMP ( most recent) CMP     Component Value Date/Time   NA 138 04/29/2023 0730   NA 140 02/10/2022 0847   K 4.1 04/29/2023 0730   CL 104 04/29/2023 0730   CO2 24 04/29/2023 0730   GLUCOSE 136 (H) 04/29/2023 0730   BUN 18 04/29/2023 0730   BUN 9 02/10/2022 0847   CREATININE 0.79 04/29/2023 0730   CREATININE 0.79 04/08/2021 1659   CALCIUM 9.2 04/29/2023 0730   PROT 6.9 04/29/2023 0730   PROT 6.6 02/10/2022 0847   ALBUMIN 3.9 04/29/2023 0730   ALBUMIN 4.3 02/10/2022 0847   AST 18 04/29/2023 0730   ALT 16 04/29/2023 0730   ALKPHOS 59 04/29/2023 0730   BILITOT 0.5 04/29/2023 0730   BILITOT 0.4 02/10/2022 0847   GFRNONAA >60 04/29/2023 0730   GFRNONAA 89 04/08/2021 1659   GFRAA 103 04/08/2021 1659     Diabetic Labs (most recent): Lab Results  Component Value Date   HGBA1C 7.2 (H) 04/29/2023   HGBA1C 8.0 (A) 02/04/2023   HGBA1C 7.8 (A) 11/05/2022   MICROALBUR <0.2 01/06/2021   MICROALBUR 20.3 (H) 07/11/2015     Lipid Panel ( most recent) Lipid Panel     Component Value Date/Time   CHOL 141 04/29/2023 0730    CHOL 147 02/10/2022 0847   TRIG 213 (H) 04/29/2023 0730   HDL 32 (L) 04/29/2023 0730   HDL 46 02/10/2022 0847   CHOLHDL 4.4 04/29/2023 0730   VLDL 43 (H) 04/29/2023 0730   LDLCALC 66 04/29/2023 0730   LDLCALC 71 02/10/2022 0847   LDLCALC 95 04/08/2021 1659   LABVLDL 30 02/10/2022 0847      Lab Results  Component Value Date   TSH 1.068 04/29/2023   TSH 0.796 11/03/2022   TSH 1.155 08/03/2022   TSH 0.073 (L) 05/01/2022   TSH 0.310 (L) 02/10/2022   TSH 0.741 10/22/2021   TSH <0.01 (L) 04/08/2021   TSH 1.137 07/21/2018   TSH 0.944 07/11/2015   TSH 1.191 04/30/2015   FREET4 0.87 04/29/2023   FREET4 0.74 11/03/2022   FREET4 0.62 08/03/2022   FREET4 0.86 05/01/2022   FREET4 1.37 02/10/2022   FREET4 0.83 10/22/2021      Thyroid ultrasound October 24, 2021  There is a small subcentimeter anechoic cystic nodule in the right mid thyroid lobe. This nodule does NOT meet TI-RADS criteria for biopsy or dedicated follow-up.   IMPRESSION: Normal size and sonographic appearance of the thyroid gland.   Assessment & Plan:   1. Hypothyroidism 2.  Type 2 diabetes 3.  Hyperlipidemia 4.  Morbid obesity  Her previsit thyroid function tests are consistent with appropriate replacement.  She is advised to continue levothyroxine 50 mcg p.o. daily before breakfast.  - We discussed about the correct intake of her thyroid hormone, on empty stomach at fasting, with water, separated by at least 30 minutes from breakfast and other medications,  and separated by more than 4 hours from calcium, iron, multivitamins, acid reflux medications (PPIs). -Patient is made aware of the fact that thyroid hormone replacement is needed for life, dose to be adjusted by periodic monitoring of thyroid function tests.  Her baseline thyroid ultrasound is unremarkable. Regarding her metabolic dysfunction including type 2 diabetes hyperlipidemia, hypertension, morbid obesity, she remains a good candidate for lifestyle  medicine. - she acknowledges that there is a room for improvement in her food and drink choices. -  Suggestion is made for her to avoid simple carbohydrates  from her diet including Cakes, Sweet Desserts, Ice Cream, Soda (diet and regular), Sweet Tea, Candies, Chips, Cookies, Store Bought Juices, Alcohol , Artificial Sweeteners,  Coffee Creamer, and "Sugar-free" Products, Lemonade. This will help patient to have more stable blood glucose profile and potentially avoid unintended weight gain.  The following Lifestyle Medicine recommendations according to American College of Lifestyle Medicine  [facility]) were discussed and and offered to patient and she  agrees to start the journey:  A. Whole Foods, Plant-Based Nutrition comprising of fruits and vegetables, plant-based proteins, whole-grain carbohydrates was discussed in detail with the patient.   A list for source of those nutrients were also provided to the patient.  Patient will use only water or unsweetened tea for hydration. B.  The need to stay away from risky substances including alcohol, smoking; obtaining 7 to 9 hours of restorative sleep, at least 150 minutes of moderate intensity exercise weekly, the importance of healthy social connections,  and stress management techniques were discussed. C.  A full color page of  Calorie density of various food groups per pound showing examples of each food groups was provided to the patient.  She is also responding to her current regimen.  I discussed and increase her Jardiance to 25 mg p.o. daily at breakfast along with her metformin 1000 mg p.o. daily at breakfast.  Side effects and precautions discussed with her.  Her freestyle libre 3 AGP shows 82% time in range, 18% level 1 hyperglycemia.  She has no hypoglycemia.  This is consistent with good response to her medications.  -In light of her polypharmacy, even with partial engagement, she would benefit the most from lifestyle medicine. She is advised to  continue atorvastatin 40 mg p.o. nightly for hyperlipidemia.  Side effects and precautions discussed with her.  She is advised to maintain close follow-up with her PMD.   I spent  26  minutes in the care of the patient today including review of labs from CMP, Lipids, Thyroid Function, Hematology (current and previous including abstractions from other facilities); face-to-face time discussing  her blood glucose readings/logs, discussing hypoglycemia and hyperglycemia episodes and symptoms, medications doses, her options of short and long term treatment based on the latest standards of care / guidelines;  discussion about incorporating lifestyle medicine;  and documenting the encounter. Risk reduction counseling performed per USPSTF guidelines to reduce  obesity and cardiovascular risk factors.     Please refer to Patient Instructions for Blood Glucose Monitoring and Insulin/Medications Dosing Guide"  in media tab for additional information. Please  also refer to " Patient Self Inventory" in the Media  tab for reviewed elements of pertinent patient history.  Heather Burnett participated in the discussions, expressed understanding, and voiced agreement with the above plans.  All questions were answered to her satisfaction. she is encouraged to contact clinic should she have any questions or concerns prior to her return visit.   Follow up plan: Return in about 4 months (around 09/06/2023) for Bring Meter/CGM Device/Logs- A1c in Office.   Marquis Lunch, MD [facility] [address] [place], [place] [address] Phone: [phone]  Fax: [phone]     05/06/2023, 6:02 PM  This note was partially dictated with voice recognition software. Similar sounding words can be transcribed inadequately or may not  be corrected upon review.

## 2023-05-07 ENCOUNTER — Other Ambulatory Visit (HOSPITAL_COMMUNITY): Payer: Self-pay

## 2023-05-17 ENCOUNTER — Other Ambulatory Visit (HOSPITAL_COMMUNITY): Payer: Self-pay

## 2023-05-17 DIAGNOSIS — M722 Plantar fascial fibromatosis: Secondary | ICD-10-CM | POA: Diagnosis not present

## 2023-05-17 DIAGNOSIS — M7662 Achilles tendinitis, left leg: Secondary | ICD-10-CM | POA: Diagnosis not present

## 2023-05-17 DIAGNOSIS — M7671 Peroneal tendinitis, right leg: Secondary | ICD-10-CM | POA: Diagnosis not present

## 2023-05-17 DIAGNOSIS — M205X2 Other deformities of toe(s) (acquired), left foot: Secondary | ICD-10-CM | POA: Diagnosis not present

## 2023-05-17 DIAGNOSIS — M76822 Posterior tibial tendinitis, left leg: Secondary | ICD-10-CM | POA: Diagnosis not present

## 2023-05-18 ENCOUNTER — Other Ambulatory Visit: Payer: Self-pay

## 2023-05-18 ENCOUNTER — Encounter: Payer: Self-pay | Admitting: Podiatry

## 2023-05-18 ENCOUNTER — Other Ambulatory Visit: Payer: Self-pay | Admitting: Podiatry

## 2023-05-18 ENCOUNTER — Other Ambulatory Visit (HOSPITAL_COMMUNITY): Payer: Self-pay

## 2023-05-18 DIAGNOSIS — M7662 Achilles tendinitis, left leg: Secondary | ICD-10-CM

## 2023-05-18 MED ORDER — PREDNISONE 10 MG PO TABS
ORAL_TABLET | ORAL | 0 refills | Status: DC
  Filled 2023-05-18: qty 21, 6d supply, fill #0

## 2023-05-19 ENCOUNTER — Other Ambulatory Visit: Payer: Self-pay

## 2023-05-19 ENCOUNTER — Encounter: Payer: Self-pay | Admitting: "Endocrinology

## 2023-05-19 DIAGNOSIS — N907 Vulvar cyst: Secondary | ICD-10-CM | POA: Diagnosis not present

## 2023-05-19 MED ORDER — DEXCOM G7 SENSOR MISC: 1 refills | Status: DC

## 2023-05-20 ENCOUNTER — Other Ambulatory Visit (HOSPITAL_COMMUNITY): Payer: Self-pay

## 2023-05-20 ENCOUNTER — Other Ambulatory Visit: Payer: Self-pay

## 2023-05-20 MED ORDER — DEXCOM G7 SENSOR MISC
1 refills | Status: DC
  Filled 2023-05-20: qty 3, 30d supply, fill #0
  Filled 2023-06-30: qty 3, 30d supply, fill #1
  Filled 2023-07-31: qty 3, 30d supply, fill #2
  Filled 2023-08-27: qty 3, 30d supply, fill #3
  Filled 2023-10-21: qty 3, 30d supply, fill #4
  Filled 2023-11-20: qty 3, 30d supply, fill #5

## 2023-05-24 ENCOUNTER — Encounter: Payer: Self-pay | Admitting: Podiatry

## 2023-05-27 ENCOUNTER — Other Ambulatory Visit: Payer: 59

## 2023-05-27 ENCOUNTER — Other Ambulatory Visit: Payer: Self-pay | Admitting: Oncology

## 2023-05-27 DIAGNOSIS — Z006 Encounter for examination for normal comparison and control in clinical research program: Secondary | ICD-10-CM

## 2023-05-30 ENCOUNTER — Other Ambulatory Visit: Payer: Self-pay | Admitting: "Endocrinology

## 2023-05-30 ENCOUNTER — Other Ambulatory Visit (HOSPITAL_COMMUNITY): Payer: Self-pay

## 2023-05-31 ENCOUNTER — Telehealth: Payer: Self-pay | Admitting: "Endocrinology

## 2023-05-31 ENCOUNTER — Other Ambulatory Visit: Payer: Self-pay

## 2023-05-31 NOTE — Telephone Encounter (Signed)
Dr Fransico Him This patient's primary doctor faxed over that she needs to be seen for Pituitary Microadenoma. Do you see anything in her chart to be helpful for you? I did not see anything.

## 2023-06-01 ENCOUNTER — Other Ambulatory Visit: Payer: Self-pay

## 2023-06-01 ENCOUNTER — Other Ambulatory Visit (HOSPITAL_COMMUNITY): Payer: Self-pay

## 2023-06-01 ENCOUNTER — Encounter: Payer: Self-pay | Admitting: Pharmacist

## 2023-06-01 MED ORDER — METFORMIN HCL ER 500 MG PO TB24
1000.0000 mg | ORAL_TABLET | Freq: Every day | ORAL | 0 refills | Status: DC
  Filled 2023-06-01 – 2023-06-06 (×2): qty 180, 90d supply, fill #0

## 2023-06-01 NOTE — Telephone Encounter (Signed)
They called back and said they will order a MRI

## 2023-06-01 NOTE — Telephone Encounter (Signed)
We got someone on the phone and she would speak to the provider

## 2023-06-03 DIAGNOSIS — N907 Vulvar cyst: Secondary | ICD-10-CM | POA: Diagnosis not present

## 2023-06-04 ENCOUNTER — Other Ambulatory Visit: Payer: Self-pay

## 2023-06-04 DIAGNOSIS — G4733 Obstructive sleep apnea (adult) (pediatric): Secondary | ICD-10-CM | POA: Diagnosis not present

## 2023-06-06 ENCOUNTER — Other Ambulatory Visit: Payer: Self-pay | Admitting: Nurse Practitioner

## 2023-06-07 ENCOUNTER — Other Ambulatory Visit: Payer: Self-pay

## 2023-06-07 ENCOUNTER — Other Ambulatory Visit (HOSPITAL_COMMUNITY): Payer: Self-pay

## 2023-06-07 DIAGNOSIS — G4733 Obstructive sleep apnea (adult) (pediatric): Secondary | ICD-10-CM | POA: Diagnosis not present

## 2023-06-07 MED ORDER — LEVOTHYROXINE SODIUM 50 MCG PO TABS
50.0000 ug | ORAL_TABLET | Freq: Every day | ORAL | 1 refills | Status: DC
  Filled 2023-06-07: qty 90, 90d supply, fill #0
  Filled 2023-08-27: qty 90, 90d supply, fill #1

## 2023-06-11 ENCOUNTER — Other Ambulatory Visit (HOSPITAL_COMMUNITY)
Admission: RE | Admit: 2023-06-11 | Discharge: 2023-06-11 | Disposition: A | Discharge status: Home / Self Care | Payer: Self-pay | Source: Ambulatory Visit | Attending: Oncology | Admitting: Oncology

## 2023-06-11 DIAGNOSIS — Z006 Encounter for examination for normal comparison and control in clinical research program: Secondary | ICD-10-CM

## 2023-06-18 ENCOUNTER — Ambulatory Visit
Admission: RE | Admit: 2023-06-18 | Discharge: 2023-06-18 | Disposition: A | Discharge status: Home / Self Care | Payer: 59 | Source: Ambulatory Visit | Attending: Podiatry | Admitting: Podiatry

## 2023-06-18 DIAGNOSIS — M7989 Other specified soft tissue disorders: Secondary | ICD-10-CM | POA: Diagnosis not present

## 2023-06-18 DIAGNOSIS — M7662 Achilles tendinitis, left leg: Secondary | ICD-10-CM

## 2023-06-18 DIAGNOSIS — M79672 Pain in left foot: Secondary | ICD-10-CM | POA: Diagnosis not present

## 2023-06-26 LAB — GENECONNECT MOLECULAR SCREEN: Genetic Analysis Overall Interpretation: NEGATIVE

## 2023-06-26 LAB — HELIX MOLECULAR SCREEN

## 2023-06-30 ENCOUNTER — Other Ambulatory Visit: Payer: Self-pay

## 2023-06-30 ENCOUNTER — Other Ambulatory Visit (HOSPITAL_COMMUNITY): Payer: Self-pay | Admitting: Urgent Care

## 2023-06-30 ENCOUNTER — Other Ambulatory Visit (HOSPITAL_COMMUNITY): Payer: Self-pay

## 2023-07-01 ENCOUNTER — Other Ambulatory Visit (HOSPITAL_COMMUNITY): Payer: Self-pay

## 2023-07-01 ENCOUNTER — Other Ambulatory Visit: Payer: Self-pay

## 2023-07-01 DIAGNOSIS — F5105 Insomnia due to other mental disorder: Secondary | ICD-10-CM | POA: Diagnosis not present

## 2023-07-01 DIAGNOSIS — F331 Major depressive disorder, recurrent, moderate: Secondary | ICD-10-CM | POA: Diagnosis not present

## 2023-07-01 DIAGNOSIS — F419 Anxiety disorder, unspecified: Secondary | ICD-10-CM | POA: Diagnosis not present

## 2023-07-01 DIAGNOSIS — F4312 Post-traumatic stress disorder, chronic: Secondary | ICD-10-CM | POA: Diagnosis not present

## 2023-07-01 MED ORDER — LORAZEPAM 0.5 MG PO TABS
0.5000 mg | ORAL_TABLET | Freq: Every day | ORAL | 2 refills | Status: DC | PRN
  Filled 2023-07-01: qty 10, 10d supply, fill #0
  Filled 2023-11-20: qty 10, 10d supply, fill #1

## 2023-07-01 MED ORDER — LAMOTRIGINE 200 MG PO TABS
200.0000 mg | ORAL_TABLET | Freq: Every day | ORAL | 0 refills | Status: DC
  Filled 2023-08-27 – 2024-03-30 (×3): qty 90, 90d supply, fill #0

## 2023-07-01 MED ORDER — HYDROXYZINE PAMOATE 25 MG PO CAPS
25.0000 mg | ORAL_CAPSULE | Freq: Two times a day (BID) | ORAL | 0 refills | Status: DC | PRN
  Filled 2023-07-01: qty 180, 90d supply, fill #0

## 2023-07-01 MED ORDER — LAMOTRIGINE 25 MG PO TABS
50.0000 mg | ORAL_TABLET | Freq: Every evening | ORAL | 0 refills | Status: DC
  Filled 2023-08-27 – 2024-03-16 (×5): qty 180, 90d supply, fill #0

## 2023-07-02 ENCOUNTER — Other Ambulatory Visit (HOSPITAL_COMMUNITY): Payer: Self-pay

## 2023-07-02 ENCOUNTER — Other Ambulatory Visit: Payer: Self-pay

## 2023-07-05 DIAGNOSIS — G4733 Obstructive sleep apnea (adult) (pediatric): Secondary | ICD-10-CM | POA: Diagnosis not present

## 2023-07-06 DIAGNOSIS — M205X2 Other deformities of toe(s) (acquired), left foot: Secondary | ICD-10-CM | POA: Diagnosis not present

## 2023-07-06 DIAGNOSIS — M7662 Achilles tendinitis, left leg: Secondary | ICD-10-CM | POA: Diagnosis not present

## 2023-07-06 DIAGNOSIS — M958 Other specified acquired deformities of musculoskeletal system: Secondary | ICD-10-CM | POA: Diagnosis not present

## 2023-07-06 DIAGNOSIS — M7671 Peroneal tendinitis, right leg: Secondary | ICD-10-CM | POA: Diagnosis not present

## 2023-07-07 ENCOUNTER — Other Ambulatory Visit: Payer: Self-pay | Admitting: Podiatry

## 2023-07-08 DIAGNOSIS — G4733 Obstructive sleep apnea (adult) (pediatric): Secondary | ICD-10-CM | POA: Diagnosis not present

## 2023-07-22 ENCOUNTER — Encounter: Payer: Self-pay | Admitting: Podiatry

## 2023-07-22 ENCOUNTER — Encounter
Admission: RE | Admit: 2023-07-22 | Discharge: 2023-07-22 | Disposition: A | Discharge status: Home / Self Care | Payer: 59 | Source: Ambulatory Visit | Attending: Podiatry | Admitting: Podiatry

## 2023-07-22 ENCOUNTER — Other Ambulatory Visit: Payer: Self-pay

## 2023-07-22 DIAGNOSIS — Z01812 Encounter for preprocedural laboratory examination: Secondary | ICD-10-CM

## 2023-07-22 DIAGNOSIS — E119 Type 2 diabetes mellitus without complications: Secondary | ICD-10-CM

## 2023-07-22 DIAGNOSIS — Z981 Arthrodesis status: Secondary | ICD-10-CM

## 2023-07-22 DIAGNOSIS — G4730 Sleep apnea, unspecified: Secondary | ICD-10-CM

## 2023-07-22 NOTE — Patient Instructions (Addendum)
Your procedure is scheduled on: 07/30/2023, Friday Report to the Registration Desk on the 1st floor of the [facility]. To find out your arrival time, please call [phone] between 1PM - 3PM on: 07/29/2023 Thursday If your arrival time is 6:00 am, do not arrive before that time as the [facility] entrance doors do not open until 6:00 am.  REMEMBER: Instructions that are not followed completely may result in serious medical risk, up to and including death; or upon the discretion of your surgeon and anesthesiologist your surgery may need to be rescheduled.  Do not eat food after midnight the night before surgery.  No gum chewing or hard candies.  You may however, drink CLEAR liquids up to 2 hours before you are scheduled to arrive for your surgery. Do not drink anything within 2 hours of your scheduled arrival time.  Clear liquids include: - water  -  In addition, your doctor has ordered for you to drink the provided:   Gatorade G2 Drinking this carbohydrate drink up to two hours before surgery helps to reduce insulin resistance and improve patient outcomes. Please complete drinking 2 hours before scheduled arrival time.  One week prior to surgery: Stop Anti-inflammatories (NSAIDS) such as Advil, Aleve, Ibuprofen, Motrin, Naproxen, Naprosyn and Aspirin based products such as Excedrin, Goody's Powder, BC Powder. Stop ANY OVER THE COUNTER supplements until after surgery. You may however, continue to take Tylenol if needed for pain up until the day of surgery.  Continue taking all prescribed medications with the exception of the following:    Jardiance- hold 3 days prior to surgery. Last dose is Oct 7,2024 , Monday    Metformin- hold two days prior to surgery. Last dose  is Jul 27, 2023  Tuesday    Follow recommendations from Cardiologist or PCP regarding stopping blood thinners.  TAKE ONLY THESE MEDICATIONS THE MORNING OF SURGERY WITH A SIP OF WATER:  levothyroxine pantoprazole  (PROTONIX) take one the night before and one on the morning of surgery - helps to prevent nausea after surgery.) 3. lamictal  Use inhalers on the day of surgery and bring rescue inhaler to the hospital.   No Alcohol for 24 hours before or after surgery.  No Smoking including e-cigarettes for 24 hours before surgery.  No chewable tobacco products for at least 6 hours before surgery.  No nicotine patches on the day of surgery.  Do not use any "recreational" drugs for at least a week (preferably 2 weeks) before your surgery.  Please be advised that the combination of cocaine and anesthesia may have negative outcomes, up to and including death. If you test positive for cocaine, your surgery will be cancelled.  On the morning of surgery brush your teeth with toothpaste and water, you may rinse your mouth with mouthwash if you wish. Do not swallow any toothpaste or mouthwash.  Use CHG Soap or wipes as directed on instruction sheet.  Do not wear jewelry, make-up, hairpins, clips or nail polish.  For welded (permanent) jewelry: bracelets, anklets, waist bands, etc.  Please have this removed prior to surgery.  If it is not removed, there is a chance that hospital personnel will need to cut it off on the day of surgery.  Do not wear lotions, powders, or perfumes.   Do not shave body hair from the neck down 48 hours before surgery.  Contact lenses, hearing aids and dentures may not be worn into surgery.  Do not bring valuables to the hospital. [facility] is not responsible for any missing/lost belongings or valuables.    Notify your doctor if there is any change in your medical condition (cold, fever, infection).  Wear comfortable clothing (specific to your surgery type) to the hospital.  After surgery, you can help prevent lung complications by doing breathing exercises.  Take deep breaths and cough every 1-2 hours. Your doctor may order a device called an Incentive Spirometer to help  you take deep breaths.  If you are being admitted to the hospital overnight, leave your suitcase in the car. After surgery it may be brought to your room.   If you are being discharged the day of surgery, you will not be allowed to drive home. You will need a responsible individual to drive you home and stay with you for 24 hours after surgery.    Please call the Pre-admissions Testing Dept. at [phone] if you have any questions about these instructions.  Surgery Visitation Policy:  Patients having surgery or a procedure may have two visitors.  Children under the age of 47 must have an adult with them who is not the patient.  Inpatient Visitation:    Visiting hours are 7 a.m. to 8 p.m. Up to four visitors are allowed at one time in a patient room. The visitors may rotate out with other people during the day.  One visitor age 22 or older may stay with the patient overnight and must be in the room by 8 p.m.      Preparing for Surgery with CHLORHEXIDINE GLUCONATE (CHG) Soap  Chlorhexidine Gluconate (CHG) Soap  o An antiseptic cleaner that kills germs and bonds with the skin to continue killing germs even after washing  o Used for showering the night before surgery and morning of surgery  Before surgery, you can play an important role by reducing the number of germs on your skin.  CHG (Chlorhexidine gluconate) soap is an antiseptic cleanser which kills germs and bonds with the skin to continue killing germs even after washing.  Please do not use if you have an allergy to CHG or antibacterial soaps. If your skin becomes reddened/irritated stop using the CHG.  1. Shower the NIGHT BEFORE SURGERY and the MORNING OF SURGERY with CHG soap.  2. If you choose to wash your hair, wash your hair first as usual with your normal shampoo.  3. After shampooing, rinse your hair and body thoroughly to remove the shampoo.  4. Use CHG as you would any other liquid soap. You can apply CHG  directly to the skin and wash gently with a scrungie or a clean washcloth.  5. Apply the CHG soap to your body only from the neck down. Do not use on open wounds or open sores. Avoid contact with your eyes, ears, mouth, and genitals (private parts). Wash face and genitals (private parts) with your normal soap.  6. Wash thoroughly, paying special attention to the area where your surgery will be performed.  7. Thoroughly rinse your body with warm water.  8. Do not shower/wash with your normal soap after using and rinsing off the CHG soap.  9. Pat yourself dry with a clean towel.  10. Wear clean pajamas to bed the night before surgery.  12. Place clean sheets on your bed the night of your first shower and do not sleep with pets.  13. Shower again with the CHG soap on the day of surgery prior to arriving at the hospital.  14. Do not apply  any deodorants/lotions/powders.  15. Please wear clean clothes to the hospital.

## 2023-07-23 ENCOUNTER — Encounter: Payer: Self-pay | Admitting: Urgent Care

## 2023-07-23 ENCOUNTER — Encounter
Admission: RE | Admit: 2023-07-23 | Discharge: 2023-07-23 | Disposition: A | Discharge status: Home / Self Care | Payer: 59 | Source: Ambulatory Visit | Attending: Podiatry | Admitting: Podiatry

## 2023-07-23 DIAGNOSIS — G4730 Sleep apnea, unspecified: Secondary | ICD-10-CM | POA: Insufficient documentation

## 2023-07-23 DIAGNOSIS — Z01812 Encounter for preprocedural laboratory examination: Secondary | ICD-10-CM | POA: Insufficient documentation

## 2023-07-23 DIAGNOSIS — Z981 Arthrodesis status: Secondary | ICD-10-CM | POA: Insufficient documentation

## 2023-07-23 DIAGNOSIS — Z01818 Encounter for other preprocedural examination: Secondary | ICD-10-CM | POA: Diagnosis present

## 2023-07-23 DIAGNOSIS — Z01810 Encounter for preprocedural cardiovascular examination: Secondary | ICD-10-CM | POA: Insufficient documentation

## 2023-07-23 LAB — CBC
HCT: 42.3 % (ref 36.0–46.0)
Hemoglobin: 14 g/dL (ref 12.0–15.0)
MCH: 27.3 pg (ref 26.0–34.0)
MCHC: 33.1 g/dL (ref 30.0–36.0)
MCV: 82.5 fL (ref 80.0–100.0)
Platelets: 456 10*3/uL — ABNORMAL HIGH (ref 150–400)
RBC: 5.13 MIL/uL — ABNORMAL HIGH (ref 3.87–5.11)
RDW: 13.7 % (ref 11.5–15.5)
WBC: 12.3 10*3/uL — ABNORMAL HIGH (ref 4.0–10.5)
nRBC: 0 % (ref 0.0–0.2)

## 2023-07-23 LAB — BASIC METABOLIC PANEL
Anion gap: 10 (ref 5–15)
BUN: 19 mg/dL (ref 6–20)
CO2: 25 mmol/L (ref 22–32)
Calcium: 9.5 mg/dL (ref 8.9–10.3)
Chloride: 102 mmol/L (ref 98–111)
Creatinine, Ser: 0.77 mg/dL (ref 0.44–1.00)
GFR, Estimated: >60 mL/min (ref >60)
Glucose, Bld: 105 mg/dL — ABNORMAL HIGH (ref 70–99)
Potassium: 4.1 mmol/L (ref 3.5–5.1)
Sodium: 137 mmol/L (ref 135–145)

## 2023-07-25 ENCOUNTER — Encounter: Payer: Self-pay | Admitting: Urgent Care

## 2023-07-29 MED ORDER — CEFAZOLIN SODIUM-DEXTROSE 2-4 GM/100ML-% IV SOLN
2.0000 g | INTRAVENOUS | Status: AC
  Administered 2023-07-30: 2 g via INTRAVENOUS

## 2023-07-29 MED ORDER — LACTATED RINGERS IV SOLN: INTRAVENOUS | Status: DC

## 2023-07-30 ENCOUNTER — Ambulatory Visit: Payer: 59

## 2023-07-30 ENCOUNTER — Ambulatory Visit: Payer: 59 | Admitting: Anesthesiology

## 2023-07-30 ENCOUNTER — Encounter: Payer: Self-pay | Admitting: Podiatry

## 2023-07-30 ENCOUNTER — Other Ambulatory Visit: Payer: Self-pay

## 2023-07-30 ENCOUNTER — Encounter
Admission: RE | Disposition: A | Discharge status: Home / Self Care | Payer: Self-pay | Source: Home / Self Care | Attending: Podiatry

## 2023-07-30 ENCOUNTER — Ambulatory Visit: Payer: 59 | Admitting: Urgent Care

## 2023-07-30 ENCOUNTER — Ambulatory Visit
Admission: RE | Admit: 2023-07-30 | Discharge: 2023-07-30 | Disposition: A | Discharge status: Home / Self Care | Payer: 59 | Attending: Podiatry | Admitting: Podiatry

## 2023-07-30 DIAGNOSIS — G4730 Sleep apnea, unspecified: Secondary | ICD-10-CM | POA: Diagnosis not present

## 2023-07-30 DIAGNOSIS — X58XXXA Exposure to other specified factors, initial encounter: Secondary | ICD-10-CM | POA: Insufficient documentation

## 2023-07-30 DIAGNOSIS — E119 Type 2 diabetes mellitus without complications: Secondary | ICD-10-CM | POA: Insufficient documentation

## 2023-07-30 DIAGNOSIS — M810 Age-related osteoporosis without current pathological fracture: Secondary | ICD-10-CM | POA: Insufficient documentation

## 2023-07-30 DIAGNOSIS — K219 Gastro-esophageal reflux disease without esophagitis: Secondary | ICD-10-CM | POA: Insufficient documentation

## 2023-07-30 DIAGNOSIS — M659 Unspecified synovitis and tenosynovitis, unspecified site: Secondary | ICD-10-CM | POA: Diagnosis not present

## 2023-07-30 DIAGNOSIS — M899 Disorder of bone, unspecified: Secondary | ICD-10-CM | POA: Insufficient documentation

## 2023-07-30 DIAGNOSIS — M2022 Hallux rigidus, left foot: Secondary | ICD-10-CM | POA: Insufficient documentation

## 2023-07-30 DIAGNOSIS — Z981 Arthrodesis status: Secondary | ICD-10-CM

## 2023-07-30 DIAGNOSIS — S96812A Strain of other specified muscles and tendons at ankle and foot level, left foot, initial encounter: Secondary | ICD-10-CM | POA: Diagnosis not present

## 2023-07-30 DIAGNOSIS — Z87891 Personal history of nicotine dependence: Secondary | ICD-10-CM | POA: Diagnosis not present

## 2023-07-30 DIAGNOSIS — I10 Essential (primary) hypertension: Secondary | ICD-10-CM | POA: Diagnosis not present

## 2023-07-30 DIAGNOSIS — G8918 Other acute postprocedural pain: Secondary | ICD-10-CM | POA: Diagnosis not present

## 2023-07-30 DIAGNOSIS — M205X2 Other deformities of toe(s) (acquired), left foot: Secondary | ICD-10-CM | POA: Diagnosis not present

## 2023-07-30 DIAGNOSIS — J4489 Other specified chronic obstructive pulmonary disease: Secondary | ICD-10-CM | POA: Diagnosis not present

## 2023-07-30 DIAGNOSIS — M7662 Achilles tendinitis, left leg: Secondary | ICD-10-CM | POA: Diagnosis not present

## 2023-07-30 DIAGNOSIS — Z01812 Encounter for preprocedural laboratory examination: Secondary | ICD-10-CM

## 2023-07-30 HISTORY — PX: TENDON REPAIR: SHX5111

## 2023-07-30 HISTORY — PX: ANKLE ARTHROSCOPY: SHX545

## 2023-07-30 HISTORY — PX: ACHILLES TENDON SURGERY: SHX542

## 2023-07-30 HISTORY — DX: Other deformities of toe(s) (acquired), left foot: M20.5X2

## 2023-07-30 HISTORY — PX: ARTHRODESIS METATARSALPHALANGEAL JOINT (MTPJ): SHX6566

## 2023-07-30 HISTORY — DX: Achilles tendinitis, left leg: M76.62

## 2023-07-30 LAB — GLUCOSE, CAPILLARY
Glucose-Capillary: 115 mg/dL — ABNORMAL HIGH (ref 70–99)
Glucose-Capillary: 173 mg/dL — ABNORMAL HIGH (ref 70–99)

## 2023-07-30 LAB — POCT PREGNANCY, URINE: Preg Test, Ur: NEGATIVE

## 2023-07-30 SURGERY — ARTHROSCOPY, ANKLE
Anesthesia: General | Site: Toe | Laterality: Left

## 2023-07-30 MED ORDER — LIDOCAINE-EPINEPHRINE 1 %-1:100000 IJ SOLN
INTRAMUSCULAR | Status: AC
  Filled 2023-07-30: qty 1

## 2023-07-30 MED ORDER — OXYCODONE-ACETAMINOPHEN 5-325 MG PO TABS
1.0000 | ORAL_TABLET | Freq: Four times a day (QID) | ORAL | 0 refills | Status: DC | PRN
Start: 2023-07-30 — End: 2023-09-22
  Filled 2023-07-30: qty 30, 4d supply, fill #0

## 2023-07-30 MED ORDER — METOCLOPRAMIDE HCL 10 MG PO TABS: 5.0000 mg | ORAL_TABLET | Freq: Three times a day (TID) | ORAL | Status: DC | PRN

## 2023-07-30 MED ORDER — SODIUM CHLORIDE 0.9 % IR SOLN
Status: DC | PRN
  Administered 2023-07-30: 3000 mL

## 2023-07-30 MED ORDER — FENTANYL CITRATE (PF) 100 MCG/2ML IJ SOLN: 25.0000 ug | INTRAMUSCULAR | Status: DC | PRN

## 2023-07-30 MED ORDER — ONDANSETRON HCL 4 MG/2ML IJ SOLN: 4.0000 mg | Freq: Four times a day (QID) | INTRAMUSCULAR | Status: DC | PRN

## 2023-07-30 MED ORDER — METOCLOPRAMIDE HCL 5 MG/ML IJ SOLN: 5.0000 mg | Freq: Three times a day (TID) | INTRAMUSCULAR | Status: DC | PRN

## 2023-07-30 MED ORDER — ROCURONIUM BROMIDE 10 MG/ML (PF) SYRINGE
PREFILLED_SYRINGE | INTRAVENOUS | Status: DC | PRN
Start: 2023-07-30 — End: 2023-07-30
  Administered 2023-07-30: 20 mg via INTRAVENOUS
  Administered 2023-07-30: 10 mg via INTRAVENOUS
  Administered 2023-07-30: 20 mg via INTRAVENOUS
  Administered 2023-07-30: 50 mg via INTRAVENOUS

## 2023-07-30 MED ORDER — PROPOFOL 500 MG/50ML IV EMUL
INTRAVENOUS | Status: DC | PRN
Start: 2023-07-30 — End: 2023-07-30
  Administered 2023-07-30: 150 mg via INTRAVENOUS

## 2023-07-30 MED ORDER — 0.9 % SODIUM CHLORIDE (POUR BTL) OPTIME
TOPICAL | Status: DC | PRN
  Administered 2023-07-30: 500 mL
  Administered 2023-07-30: 1000 mL

## 2023-07-30 MED ORDER — SODIUM CHLORIDE 0.9% FLUSH: 3.0000 mL | Freq: Two times a day (BID) | INTRAVENOUS | Status: DC

## 2023-07-30 MED ORDER — LIDOCAINE-EPINEPHRINE 1 %-1:100000 IJ SOLN
INTRAMUSCULAR | Status: DC | PRN
Start: 2023-07-30 — End: 2023-07-30
  Administered 2023-07-30: 20 mL

## 2023-07-30 MED ORDER — LIDOCAINE HCL (PF) 2 % IJ SOLN
INTRAMUSCULAR | Status: DC | PRN
Start: 2023-07-30 — End: 2023-07-30
  Administered 2023-07-30: 100 mg via INTRADERMAL

## 2023-07-30 MED ORDER — MIDAZOLAM HCL 2 MG/2ML IJ SOLN
INTRAMUSCULAR | Status: AC
  Filled 2023-07-30: qty 2

## 2023-07-30 MED ORDER — DROPERIDOL 2.5 MG/ML IJ SOLN
0.6250 mg | Freq: Once | INTRAMUSCULAR | Status: AC
  Administered 2023-07-30: 0.625 mg via INTRAVENOUS
  Filled 2023-07-30: qty 2

## 2023-07-30 MED ORDER — HYDROMORPHONE HCL 1 MG/ML IJ SOLN
INTRAMUSCULAR | Status: DC | PRN
Start: 2023-07-30 — End: 2023-07-30
  Administered 2023-07-30: 1 mg via INTRAVENOUS

## 2023-07-30 MED ORDER — ONDANSETRON HCL 4 MG/2ML IJ SOLN
INTRAMUSCULAR | Status: DC | PRN
Start: 2023-07-30 — End: 2023-07-30
  Administered 2023-07-30: 4 mg via INTRAVENOUS

## 2023-07-30 MED ORDER — DROPERIDOL 2.5 MG/ML IJ SOLN
INTRAMUSCULAR | Status: AC
  Filled 2023-07-30: qty 2

## 2023-07-30 MED ORDER — PROPOFOL 10 MG/ML IV BOLUS
INTRAVENOUS | Status: AC
  Filled 2023-07-30: qty 40

## 2023-07-30 MED ORDER — FENTANYL CITRATE (PF) 100 MCG/2ML IJ SOLN
INTRAMUSCULAR | Status: AC
  Filled 2023-07-30: qty 2

## 2023-07-30 MED ORDER — DEXAMETHASONE SODIUM PHOSPHATE 10 MG/ML IJ SOLN
INTRAMUSCULAR | Status: DC | PRN
Start: 2023-07-30 — End: 2023-07-30
  Administered 2023-07-30: 10 mg via INTRAVENOUS

## 2023-07-30 MED ORDER — CEFAZOLIN SODIUM-DEXTROSE 2-4 GM/100ML-% IV SOLN
INTRAVENOUS | Status: AC
  Filled 2023-07-30: qty 100

## 2023-07-30 MED ORDER — ONDANSETRON HCL 4 MG PO TABS: 4.0000 mg | ORAL_TABLET | Freq: Four times a day (QID) | ORAL | Status: DC | PRN

## 2023-07-30 MED ORDER — DEXMEDETOMIDINE HCL IN NACL 80 MCG/20ML IV SOLN
INTRAVENOUS | Status: DC | PRN
Start: 2023-07-30 — End: 2023-07-30
  Administered 2023-07-30: 8 ug via INTRAVENOUS
  Administered 2023-07-30: 12 ug via INTRAVENOUS

## 2023-07-30 MED ORDER — OXYCODONE HCL 5 MG PO TABS: 5.0000 mg | ORAL_TABLET | Freq: Once | ORAL | Status: DC | PRN

## 2023-07-30 MED ORDER — SEVOFLURANE IN SOLN
RESPIRATORY_TRACT | Status: AC
  Filled 2023-07-30: qty 250

## 2023-07-30 MED ORDER — CHLORHEXIDINE GLUCONATE 0.12 % MT SOLN
OROMUCOSAL | Status: AC
  Filled 2023-07-30: qty 15

## 2023-07-30 MED ORDER — FENTANYL CITRATE (PF) 100 MCG/2ML IJ SOLN
INTRAMUSCULAR | Status: DC | PRN
Start: 2023-07-30 — End: 2023-07-30
  Administered 2023-07-30 (×2): 50 ug via INTRAVENOUS

## 2023-07-30 MED ORDER — FENTANYL CITRATE PF 50 MCG/ML IJ SOSY
PREFILLED_SYRINGE | INTRAMUSCULAR | Status: AC
  Filled 2023-07-30: qty 1

## 2023-07-30 MED ORDER — OXYCODONE HCL 5 MG/5ML PO SOLN: 5.0000 mg | Freq: Once | ORAL | Status: DC | PRN

## 2023-07-30 MED ORDER — LIDOCAINE HCL (PF) 1 % IJ SOLN
INTRAMUSCULAR | Status: AC
  Filled 2023-07-30: qty 5

## 2023-07-30 MED ORDER — BUPIVACAINE HCL (PF) 0.5 % IJ SOLN
INTRAMUSCULAR | Status: DC | PRN
Start: 2023-07-30 — End: 2023-07-30
  Administered 2023-07-30 (×2): 15 mL via PERINEURAL

## 2023-07-30 MED ORDER — BUPIVACAINE HCL (PF) 0.5 % IJ SOLN
INTRAMUSCULAR | Status: AC
  Filled 2023-07-30: qty 30

## 2023-07-30 MED ORDER — HYDROMORPHONE HCL 1 MG/ML IJ SOLN
INTRAMUSCULAR | Status: AC
  Filled 2023-07-30: qty 1

## 2023-07-30 MED ORDER — FENTANYL CITRATE PF 50 MCG/ML IJ SOSY
50.0000 ug | PREFILLED_SYRINGE | Freq: Once | INTRAMUSCULAR | Status: AC
  Administered 2023-07-30: 50 ug via INTRAVENOUS

## 2023-07-30 MED ORDER — MIDAZOLAM HCL 2 MG/2ML IJ SOLN
1.0000 mg | INTRAMUSCULAR | Status: DC | PRN
  Administered 2023-07-30: 1 mg via INTRAVENOUS

## 2023-07-30 MED ORDER — SUGAMMADEX SODIUM 200 MG/2ML IV SOLN
INTRAVENOUS | Status: DC | PRN
Start: 2023-07-30 — End: 2023-07-30
  Administered 2023-07-30: 200 mg via INTRAVENOUS

## 2023-07-30 SURGICAL SUPPLY — 127 items
ADAPTER IRRIG TUBE 2 SPIKE SOL (ADAPTER) ×6 IMPLANT
ADPR TBG 2 SPK PMP STRL ASCP (ADAPTER) ×6
ARTHROWAND PARAGON T2 (SURGICAL WAND)
BIT DRILL 2 FAST STEP (BIT) ×1 IMPLANT
BIT DRILL 4X4.5 FOOTPRINT STR (BIT) ×2 IMPLANT
BLADE SHAVER 2.9D 7 MINI (BLADE) ×3 IMPLANT
BLADE SURG 15 STRL LF DISP TIS (BLADE) ×6 IMPLANT
BLADE SURG 15 STRL SS (BLADE) ×6
BLADE SURG MINI STRL (BLADE) ×3 IMPLANT
BNDG CMPR 5X4 CHSV STRCH STRL (GAUZE/BANDAGES/DRESSINGS) ×3
BNDG CMPR 75X21 PLY HI ABS (MISCELLANEOUS) ×3
BNDG CMPR STD VLCR NS LF 5.8X4 (GAUZE/BANDAGES/DRESSINGS) ×6
BNDG COHESIVE 4X5 TAN STRL LF (GAUZE/BANDAGES/DRESSINGS) ×3 IMPLANT
BNDG ELASTIC 4X5.8 VLCR NS LF (GAUZE/BANDAGES/DRESSINGS) ×6 IMPLANT
BNDG ESMARCH 4 X 12 STRL LF (GAUZE/BANDAGES/DRESSINGS) ×3
BNDG ESMARCH 4X12 STRL LF (GAUZE/BANDAGES/DRESSINGS) ×3 IMPLANT
BNDG ESMARCH 6 X 12 STRL LF (GAUZE/BANDAGES/DRESSINGS) ×3
BNDG ESMARCH 6X12 STRL LF (GAUZE/BANDAGES/DRESSINGS) ×3 IMPLANT
BNDG GAUZE DERMACEA FLUFF 4 (GAUZE/BANDAGES/DRESSINGS) ×3 IMPLANT
BNDG GZE 12X3 1 PLY HI ABS (GAUZE/BANDAGES/DRESSINGS) ×3
BNDG GZE DERMACEA 4 6PLY (GAUZE/BANDAGES/DRESSINGS) ×3
BNDG STRETCH GAUZE 3IN X12FT (GAUZE/BANDAGES/DRESSINGS) ×3 IMPLANT
BOOT STEPPER DURA MED (SOFTGOODS) ×1 IMPLANT
BUR ABRADER 2.9 BARREL PURPL (BURR) ×1 IMPLANT
BURR ABRADER 2.9 BARREL PURPL (BURR) ×3
CUFF TOURN SGL QUICK 12 (TOURNIQUET CUFF) IMPLANT
CUFF TOURN SGL QUICK 18X4 (TOURNIQUET CUFF) ×1 IMPLANT
CUFF TOURN SGL QUICK 24 (TOURNIQUET CUFF)
CUFF TRNQT CYL 24X4X16.5-23 (TOURNIQUET CUFF) IMPLANT
DRAPE ARTHRO LIMB 89X125 STRL (DRAPES) ×3 IMPLANT
DRAPE FLUOR MINI C-ARM 54X84 (DRAPES) ×4 IMPLANT
DRAPE IMP U-DRAPE 54X76 (DRAPES) ×1 IMPLANT
DRILL 4X4.5 FOOTPRINT STR (BIT)
DRSG TEGADERM 2-3/8X2-3/4 SM (GAUZE/BANDAGES/DRESSINGS) ×2 IMPLANT
DRSG TELFA 4X3 1S NADH ST (GAUZE/BANDAGES/DRESSINGS) ×2 IMPLANT
DURAPREP 26ML APPLICATOR (WOUND CARE) ×4 IMPLANT
ELECT REM PT RETURN 9FT ADLT (ELECTROSURGICAL) ×6
ELECTRODE REM PT RTRN 9FT ADLT (ELECTROSURGICAL) ×4 IMPLANT
ETHIBOND 2 0 GREEN CT 2 30IN (SUTURE) IMPLANT
GAUZE 4X4 16PLY ~~LOC~~+RFID DBL (SPONGE) ×1 IMPLANT
GAUZE SPONGE 4X4 12PLY STRL (GAUZE/BANDAGES/DRESSINGS) ×3 IMPLANT
GAUZE STRETCH 2X75IN STRL (MISCELLANEOUS) ×3 IMPLANT
GAUZE XEROFORM 1X8 LF (GAUZE/BANDAGES/DRESSINGS) ×3 IMPLANT
GLOVE BIO SURGEON STRL SZ7.5 (GLOVE) ×3 IMPLANT
GLOVE INDICATOR 8.0 STRL GRN (GLOVE) ×2 IMPLANT
GOWN STRL REUS W/ TWL XL LVL3 (GOWN DISPOSABLE) ×6 IMPLANT
GOWN STRL REUS W/TWL XL LVL3 (GOWN DISPOSABLE) ×6
HANDLE YANKAUER SUCT BULB TIP (MISCELLANEOUS) ×3 IMPLANT
IV LACTATED RINGER IRRG 3000ML (IV SOLUTION) ×3
IV LR IRRIG 3000ML ARTHROMATIC (IV SOLUTION) ×5 IMPLANT
IV NS 500ML (IV SOLUTION) ×3
IV NS 500ML BAXH (IV SOLUTION) ×3 IMPLANT
K-WIRE ACE 1.6X6 (WIRE) ×9
KIT TURNOVER KIT A (KITS) ×3 IMPLANT
KWIRE ACE 1.6X6 (WIRE) ×3 IMPLANT
LABEL OR SOLS (LABEL) ×3 IMPLANT
MANIFOLD NEPTUNE II (INSTRUMENTS) ×6 IMPLANT
NDL FILTER BLUNT 18X1 1/2 (NEEDLE) ×2 IMPLANT
NDL HYPO 18GX1.5 BLUNT FILL (NEEDLE) ×2 IMPLANT
NDL HYPO 25X1 1.5 SAFETY (NEEDLE) ×6 IMPLANT
NDL MAYO CATGUT SZ5 (NEEDLE)
NDL SAFETY ECLIPSE 18X1.5 (NEEDLE) ×3 IMPLANT
NDL SUT 5 .5 CRC TPR PNT MAYO (NEEDLE) ×2 IMPLANT
NEEDLE FILTER BLUNT 18X1 1/2 (NEEDLE) ×3 IMPLANT
NEEDLE HYPO 18GX1.5 BLUNT FILL (NEEDLE) ×3 IMPLANT
NEEDLE HYPO 25X1 1.5 SAFETY (NEEDLE) ×9 IMPLANT
NS IRRIG 500ML POUR BTL (IV SOLUTION) ×3 IMPLANT
PACK ARTHROSCOPY KNEE (MISCELLANEOUS) ×3 IMPLANT
PACK EXTREMITY ARMC (MISCELLANEOUS) ×3 IMPLANT
PAD CAST 4YDX4 CTTN HI CHSV (CAST SUPPLIES) ×3 IMPLANT
PADDING CAST COTTON 4X4 STRL (CAST SUPPLIES) ×3
PLATE LOCK 1ST LT MTP (Plate) ×1 IMPLANT
RASP SM TEAR CROSS CUT (RASP) ×1 IMPLANT
RESECTOR FULL RADIUS 2.9 (ORTHOPEDIC DISPOSABLE SUPPLIES) IMPLANT
SCREW PEG 2.5X14 NONLOCK (Screw) ×1 IMPLANT
SCREW PEG 2.5X18 NONLOCK (Screw) ×1 IMPLANT
SCREW PEG LOCK 2.5X14 (Peg) ×3 IMPLANT
SCREW PEG LOCK 2.5X16 (Peg) ×1 IMPLANT
SCREW PEG LOCK 2.5X18 (Peg) ×1 IMPLANT
SLEEVE REMOTE CONTROL 5X12 (DRAPES) IMPLANT
SPLINT CAST 1 STEP 5X30 WHT (MISCELLANEOUS) ×2 IMPLANT
SPLINT PLASTER CAST FAST 5X30 (CAST SUPPLIES) ×2 IMPLANT
SPONGE T-LAP 18X18 ~~LOC~~+RFID (SPONGE) ×3 IMPLANT
STOCKINETTE IMPERVIOUS 9X36 MD (GAUZE/BANDAGES/DRESSINGS) ×3 IMPLANT
STOCKINETTE M/LG 89821 (MISCELLANEOUS) ×3 IMPLANT
STRAP ANKLE FOOT DISTRACTOR (ORTHOPEDIC SUPPLIES) IMPLANT
STRAP SAFETY 5IN WIDE (MISCELLANEOUS) ×3 IMPLANT
STRIP CLOSURE SKIN 1/2X4 (GAUZE/BANDAGES/DRESSINGS) ×3 IMPLANT
STRIP CLOSURE SKIN 1/4X4 (GAUZE/BANDAGES/DRESSINGS) ×3 IMPLANT
SUT ETH BLK MONO 3 0 FS 1 12/B (SUTURE) ×2 IMPLANT
SUT ETHIBOND GREEN BRAID 0S 4 (SUTURE) IMPLANT
SUT ETHILON 3-0 FS-10 30 BLK (SUTURE) ×9
SUT ETHILON 4-0 (SUTURE) ×3
SUT ETHILON 4-0 FS2 18XMFL BLK (SUTURE) ×3
SUT MNCRL 3-0 UNDYED SH (SUTURE) ×1 IMPLANT
SUT MNCRL 4-0 (SUTURE) ×12
SUT MNCRL 4-0 27XMFL (SUTURE) ×12
SUT MNCRL AB 3-0 PS2 27 (SUTURE) ×2 IMPLANT
SUT MON AB 2-0 CT1 36 (SUTURE) ×2 IMPLANT
SUT PDS 2-0 27IN (SUTURE) ×1 IMPLANT
SUT PDS AB 0 CT1 27 (SUTURE) IMPLANT
SUT PDS II 3-0 (SUTURE) ×1 IMPLANT
SUT ULTRABRAID #2 38 (SUTURE) ×2 IMPLANT
SUT VIC AB 0 SH 27 (SUTURE) ×2 IMPLANT
SUT VIC AB 2-0 SH 27 (SUTURE)
SUT VIC AB 2-0 SH 27XBRD (SUTURE) ×4 IMPLANT
SUT VIC AB 3-0 SH 27 (SUTURE)
SUT VIC AB 3-0 SH 27X BRD (SUTURE) ×2 IMPLANT
SUT VIC AB 4-0 FS2 27 (SUTURE) ×2 IMPLANT
SUT VIC AB 4-0 SH 27 (SUTURE)
SUT VIC AB 4-0 SH 27XANBCTRL (SUTURE) ×2 IMPLANT
SUT VICRYL AB 3-0 FS1 BRD 27IN (SUTURE) ×2 IMPLANT
SUTURE EHLN 3-0 FS-10 30 BLK (SUTURE) ×3 IMPLANT
SUTURE ETHLN 4-0 FS2 18XMF BLK (SUTURE) ×3 IMPLANT
SUTURE MNCRL 4-0 27XMF (SUTURE) ×6 IMPLANT
SWABSTK COMLB BENZOIN TINCTURE (MISCELLANEOUS) ×3 IMPLANT
SYR 10ML LL (SYRINGE) ×6 IMPLANT
SYR 3ML LL SCALE MARK (SYRINGE) ×3 IMPLANT
TRAP FLUID SMOKE EVACUATOR (MISCELLANEOUS) ×3 IMPLANT
TUBE SET DOUBLEFLO INFLOW (TUBING) ×3 IMPLANT
TUBE SET DOUBLEFLO OUTFLOW (TUBING) ×3 IMPLANT
TUBING CONNECTING 10 (TUBING) ×3 IMPLANT
WAND ARTHRO PARAGON T2 (SURGICAL WAND) IMPLANT
WAND COVAC 50 IFS (MISCELLANEOUS) IMPLANT
WAND TOPAZ MICRO DEBRIDER (MISCELLANEOUS) ×1 IMPLANT
WATER STERILE IRR 500ML POUR (IV SOLUTION) ×3 IMPLANT
WIRE Z .062 C-WIRE SPADE TIP (WIRE) ×2 IMPLANT

## 2023-07-30 NOTE — Progress Notes (Signed)
Patient arrived to Post OP stating she was extremely nauseous. The PACU nurse administered medication and the pateint was allowed to rest for 30 minutes. After the 30 minutes the patient stated she had decreased nausea and was able to transfer to the recliner.

## 2023-07-30 NOTE — H&P (Signed)
HISTORY AND PHYSICAL INTERVAL NOTE:  07/30/2023  7:18 AM  Heather Burnett  has presented today for surgery, with the diagnosis of M76.62 - Achilles tendinitis of left lower extremity M20.5X2 - Hallux limitus of left foot.  The various methods of treatment have been discussed with the patient.  No guarantees were given.  After consideration of risks, benefits and other options for treatment, the patient has consented to surgery.  I have reviewed the patients' chart and labs.     A history and physical examination was performed in my office.  The patient was reexamined.  There have been no changes to this history and physical examination.  Gwyneth Revels A

## 2023-07-30 NOTE — Anesthesia Postprocedure Evaluation (Signed)
Anesthesia Post Note  Patient: Heather Burnett  Procedure(s) Performed: ANKLE ARTHROSCOPY - OSTEOCHONDRITIS DISSECANS REPAIR (Left: Ankle) ARTHRODESIS METATARSALPHALANGEAL JOINT (MTPJ) FIRST (Left: Toe) 40981 ACHILLES REPAIR SECONDARY (Left: Ankle) [id number] FLEXOR TENDON REPAIR  SECOND (Left: Ankle)  Patient location during evaluation: PACU Anesthesia Type: General Level of consciousness: awake and alert Pain management: pain level controlled Vital Signs Assessment: post-procedure vital signs reviewed and stable Respiratory status: spontaneous breathing, nonlabored ventilation, respiratory function stable and patient connected to nasal cannula oxygen Cardiovascular status: blood pressure returned to baseline and stable Postop Assessment: no apparent nausea or vomiting Anesthetic complications: no   There were no known notable events for this encounter.   Last Vitals:  Vitals:   07/30/23 1310 07/30/23 1335  BP:  (!) 100/55  Pulse: 80 75  Resp: 14 18  Temp:  (!) 35.8 C  SpO2: 92% 95%    Last Pain:  Vitals:   07/30/23 1335  TempSrc: Oral  PainSc: 0-No pain                 Cleda Mccreedy Armistead Sult

## 2023-07-30 NOTE — Anesthesia Preprocedure Evaluation (Addendum)
Anesthesia Evaluation  Patient identified by MRN, date of birth, ID band Patient awake    Reviewed: Allergy & Precautions, NPO status , Patient's Chart, lab work & pertinent test results  Airway Mallampati: III  TM Distance: <3 FB Neck ROM: full    Dental  (+) Chipped   Pulmonary asthma , sleep apnea , COPD, former smoker   Pulmonary exam normal        Cardiovascular hypertension, Normal cardiovascular exam+ dysrhythmias      Neuro/Psych  Headaches PSYCHIATRIC DISORDERS       Neuromuscular disease    GI/Hepatic Neg liver ROS, hiatal hernia,GERD  ,,  Endo/Other  diabetes, Type 2Hypothyroidism    Renal/GU      Musculoskeletal   Abdominal   Peds  Hematology negative hematology ROS (+)   Anesthesia Other Findings Past Medical History: No date: Achilles tendinitis of left lower extremity No date: Allergy No date: Anxiety No date: Asthma     Comment:  WELL CONTROLLED No date: Back pain No date: Cancer (HCC)     Comment:  melanoma on my chest No date: Chest pain No date: Constipation No date: COPD (chronic obstructive pulmonary disease) (HCC)     Comment:  not sure about COPD  No date: DDD (degenerative disc disease), cervical     Comment:  and lower back No date: Depression No date: Diabetes mellitus, type II (HCC) No date: Diverticulitis No date: DJD (degenerative joint disease) No date: Dysrhythmia No date: Empty sella syndrome (HCC)     Comment:  followed at [facility] No date: Empty sella syndrome (HCC) No date: Family history of adverse reaction to anesthesia     Comment:  mother almost died with anesthesia in the past, hard to               wake up and got real sick on my stomach vomiting, now she              receives local anethesia. No date: GERD (gastroesophageal reflux disease) No date: Hallux limitus of left foot No date: Headache     Comment:  history of migraine No date: Heart murmur No date:  Hiatal hernia No date: History of methicillin resistant staphylococcus aureus (MRSA) 07/2004: History of palpitations No date: Hyperlipidemia No date: Hypertension No date: Hypothyroidism 02/08/2020: Hypothyroidism, adult No date: Idiopathic peripheral neuropathy No date: Joint pain No date: Lactose intolerance No date: Lymphocytosis No date: Murmur 2016: Neuropathy No date: Osteoporosis No date: Palpitations No date: PCOS (polycystic ovarian syndrome) No date: Pituitary microadenoma (HCC)     Comment:  followed at [facility] 2008: Pneumonia No date: Pre-diabetes No date: Sleep apnea     Comment:  USES CPAP No date: Swallowing difficulty No date: Tachycardia No date: Thyroid disease No date: Vitamin D deficiency 2012: Von Willebrand disease (HCC)     Comment:  PT STATES SHE HAS NEVER HAD ANY ISSUES WITH BLEEDING  Past Surgical History: 12/15/2019: ACHILLES TENDON SURGERY; Left     Comment:  Procedure: ACHILLES TENDON REPAIR PRIMARY;  Surgeon:               Gwyneth Revels, DPM;  Location: [facility] ORS;  Service:               Podiatry;  Laterality: Left; 10/02/2020: ANTERIOR CERVICAL DECOMP/DISCECTOMY FUSION; N/A     Comment:  Procedure: ANTERIOR CERVICAL DECOMPRESSION/DISCECTOMY               FUSION 1 LEVEL C6-7;  Surgeon: Myer Haff,  Annia Friendly, MD;                Location: [facility] ORS;  Service: Neurosurgery;  Laterality:               N/A; No date: BACK SURGERY     Comment:  removal of 2 lumbar vertebrae 2010: BREAST BIOPSY; Right     Comment:  NEG No date: CHOLECYSTECTOMY 03/03/2013, 02/06/2008: COLONOSCOPY 08/26/2022: COLONOSCOPY; N/A     Comment:  Procedure: COLONOSCOPY;  Surgeon: Toledo, Boykin Nearing, MD;              Location: [facility] ENDOSCOPY;  Service: Gastroenterology;                Laterality: N/A; 08/05/2018: COLONOSCOPY WITH PROPOFOL; N/A     Comment:  Procedure: COLONOSCOPY WITH PROPOFOL;  Surgeon: Scot Jun, MD;  Location: [facility] ENDOSCOPY;  Service:                Endoscopy;  Laterality: N/A; 02/06/2008: EGD 06/10/2022: ESOPHAGOGASTRODUODENOSCOPY; N/A     Comment:  Procedure: ESOPHAGOGASTRODUODENOSCOPY (EGD);  Surgeon:               Toledo, Boykin Nearing, MD;  Location: [facility] ENDOSCOPY;                Service: Gastroenterology;  Laterality: N/A; 12/15/2019: GASTROC RECESSION EXTREMITY; Left     Comment:  Procedure: GASTROC RECESSION EXTREMITY;  Surgeon:               Gwyneth Revels, DPM;  Location: [facility] ORS;  Service:               Podiatry;  Laterality: Left; 08/21/2016: HALLUX VALGUS CHEILECTOMY; Left     Comment:  Procedure: HALLUX VALGUS CHEILECTOMY;  Surgeon: Linus Galas, DPM;  Location: [facility] ORS;  Service: Podiatry;                Laterality: Left; 10/2014: HEMORRHOID SURGERY No date: SPINE SURGERY No date: TONSILLECTOMY AND ADENOIDECTOMY     Reproductive/Obstetrics negative OB ROS                             Anesthesia Physical Anesthesia Plan  ASA: 3  Anesthesia Plan: General ETT   Post-op Pain Management: Regional block*   Induction: Intravenous  PONV Risk Score and Plan: Dexamethasone, Ondansetron, Midazolam and Treatment may vary due to age or medical condition  Airway Management Planned: LMA  Additional Equipment:   Intra-op Plan:   Post-operative Plan: Extubation in OR  Informed Consent: I have reviewed the patients History and Physical, chart, labs and discussed the procedure including the risks, benefits and alternatives for the proposed anesthesia with the patient or authorized representative who has indicated his/her understanding and acceptance.     Dental Advisory Given  Plan Discussed with: Anesthesiologist, CRNA and Surgeon  Anesthesia Plan Comments: (Patient consented for risks of anesthesia including but not limited to:  - adverse reactions to medications - damage to eyes, teeth, lips or other oral mucosa - nerve damage due to positioning  - sore throat or  hoarseness - Damage to heart, brain, nerves, lungs, other parts of body or loss of life  Patient voiced understanding and assent.)        Anesthesia Quick Evaluation

## 2023-07-30 NOTE — Anesthesia Procedure Notes (Signed)
Procedure Name: Intubation Date/Time: 07/30/2023 7:55 AM  Performed by: Maryla Morrow., CRNAPre-anesthesia Checklist: Patient identified, Patient being monitored, Timeout performed, Emergency Drugs available and Suction available Patient Re-evaluated:Patient Re-evaluated prior to induction Oxygen Delivery Method: Circle system utilized Preoxygenation: Pre-oxygenation with 100% oxygen Induction Type: IV induction Ventilation: Mask ventilation without difficulty Laryngoscope Size: 3 and McGraph Grade View: Grade I Tube type: Oral Tube size: 7.0 mm Number of attempts: 1 Airway Equipment and Method: Stylet Placement Confirmation: ETT inserted through vocal cords under direct vision, positive ETCO2 and breath sounds checked- equal and bilateral Secured at: 21 cm Tube secured with: Tape Dental Injury: Teeth and Oropharynx as per pre-operative assessment

## 2023-07-30 NOTE — Transfer of Care (Signed)
Immediate Anesthesia Transfer of Care Note  Patient: Heather Burnett  Procedure(s) Performed: ANKLE ARTHROSCOPY - OSTEOCHONDRITIS DISSECANS REPAIR (Left: Ankle) ARTHRODESIS METATARSALPHALANGEAL JOINT (MTPJ) FIRST (Left: Toe) [id number] ACHILLES REPAIR SECONDARY (Left: Ankle) [id number] FLEXOR TENDON REPAIR  SECOND (Left: Ankle)  Patient Location: PACU  Anesthesia Type:General  Level of Consciousness: drowsy  Airway & Oxygen Therapy: Patient Spontanous Breathing and Patient connected to face mask oxygen  Post-op Assessment: Report given to RN and Post -op Vital signs reviewed and stable  Post vital signs: stable on 6L O2 via face mask with oral airway.  Last Vitals:  Vitals Value Taken Time  BP 92/48 07/30/23 1150  Temp    Pulse 78 07/30/23 1155  Resp 16 07/30/23 1155  SpO2 94 % 07/30/23 1155  Vitals shown include unfiled device data.  Last Pain:  Vitals:   07/30/23 0613  TempSrc: Temporal  PainSc: 3          Complications: No notable events documented.

## 2023-07-30 NOTE — Discharge Instructions (Addendum)
[facility]  POST OPERATIVE INSTRUCTIONS FOR DR. Ether Griffins AND DR. BAKER [facility] PODIATRY DEPARTMENT   Take your medication as prescribed.  Pain medication should be taken only as needed.  Take an aspirin (81mg  or 325mg ) daily  Keep the dressing clean, dry and intact.  Keep your foot elevated above the heart level for the first 48 hours.  We have instructed you to be non-weight bearing.  Always wear your post-op shoe when walking.  Always use your crutches if you are to be non-weight bearing.  Do not take a shower. Baths are permissible as long as the foot is kept out of the water.   Every hour you are awake:  Bend your knee 15 times.  Call [facility] [phone]) if any of the following problems occur: You develop a temperature or fever. The bandage becomes saturated with blood. Medication does not stop your pain. Injury of the foot occurs. Any symptoms of infection including redness, odor, or red streaks running from wound.    AMBULATORY SURGERY  DISCHARGE INSTRUCTIONS   The drugs that you were given will stay in your system until tomorrow so for the next 24 hours you should not:  Drive an automobile Make any legal decisions Drink any alcoholic beverage   You may resume regular meals tomorrow.  Today it is better to start with liquids and gradually work up to solid foods.  You may eat anything you prefer, but it is better to start with liquids, then soup and crackers, and gradually work up to solid foods.   Please notify your doctor immediately if you have any unusual bleeding, trouble breathing, redness and pain at the surgery site, drainage, fever, or pain not relieved by medication.    Additional Instructions:   Please contact your physician with any problems or Same Day Surgery at [phone], Monday through Friday 6 am to 4 pm, or [place] at [facility] number at [phone].AMBULATORY SURGERY   DISCHARGE INSTRUCTIONS   The drugs that you were given will stay in your system until tomorrow so for the next 24 hours you should not:  Drive an automobile Make any legal decisions Drink any alcoholic beverage   You may resume regular meals tomorrow.  Today it is better to start with liquids and gradually work up to solid foods.  You may eat anything you prefer, but it is better to start with liquids, then soup and crackers, and gradually work up to solid foods.   Please notify your doctor immediately if you have any unusual bleeding, trouble breathing, redness and pain at the surgery site, drainage, fever, or pain not relieved by medication.    Additional Instructions:        Please contact your physician with any problems or Same Day Surgery at [phone], Monday through Friday 6 am to 4 pm, or [address] at [facility] number at [phone].

## 2023-07-30 NOTE — Anesthesia Procedure Notes (Signed)
Anesthesia Regional Block: Popliteal block   Pre-Anesthetic Checklist: , timeout performed,  Correct Patient, Correct Site, Correct Laterality,  Correct Procedure, Correct Position, site marked,  Risks and benefits discussed,  Surgical consent,  Pre-op evaluation,  At surgeon's request and post-op pain management  Laterality: Lower and Left  Prep: chloraprep       Needles:  Injection technique: Single-shot  Needle Type: Echogenic Needle     Needle Length: 9cm  Needle Gauge: 21     Additional Needles:   Procedures:,,,, ultrasound used (permanent image in chart),,    Narrative:  Start time: 07/30/2023 7:23 AM End time: 07/30/2023 7:28 AM Injection made incrementally with aspirations every 5 mL.  Performed by: Personally   Additional Notes: Adductor PNB placed on left leg as well  Patient consented for risk and benefits of nerve block including but not limited to nerve damage, failed block, bleeding and infection.  Patient voiced understanding.  Functioning IV was confirmed and monitors were applied.  Timeout done prior to procedure and prior to any sedation being given to the patient.  Patient confirmed procedure site prior to any sedation given to the patient.  A 50mm 22ga Stimuplex needle was used. Sterile prep,hand hygiene and sterile gloves were used.  Minimal sedation used for procedure.  No paresthesia endorsed by patient during the procedure.  Negative aspiration and negative test dose prior to incremental administration of local anesthetic. The patient tolerated the procedure well with no immediate complications.

## 2023-07-30 NOTE — Op Note (Signed)
Operative note   Surgeon:Starr Engel [employer] forces logistics/support/administrative officer: None    Preop diagnosis: 1.  Chronic Achilles tendinosis left ankle 2.  Peroneal brevis tendon tear left lateral ankle 3.  Peroneal longus tendinitis left lateral ankle 4.  Chronic ankle synovitis with anterior ankle exostosis 5.  Hallux rigidus left first MTPJ    Postop diagnosis: Same    Procedure: 1.  Achilles tendon repair with debridement secondary type left Achilles tendon 2.  Peroneal brevis tendon tear repair left lateral ankle 3.  Tenolysis peroneus longus left lateral ankle 4.  Ankle arthroscopy with excision of anterior tibial osteophyte and synovectomy left ankle 5.  First MTPJ arthrodesis left first MTPJ 6.  Intraoperative fluoroscopy use without assistance of radiologist    EBL: Minimal    Anesthesia:regional and general.  Patient had a popliteal block placed in the preoperative holding area    Hemostasis: Midcalf tourniquet inflated to 200 mmHg for approximately 95 minutes    Specimen: Chronic Achilles tendinitis and peroneal brevis tendon tear    Complications: None    Operative indications:Heather Burnett is an 52 y.o. that presents today for surgical intervention.  The risks/benefits/alternatives/complications have been discussed and consent has been given.    Procedure:  Patient was brought into the OR and placed on the operating table in theprone position. After anesthesia was obtained theleft lower extremity was prepped and draped in usual sterile fashion.  Attention was initially directed to the posterior aspect of the left lower extremity and a longitudinal incision was made just medial to midline of the Achilles itself.  Sharp and blunt dissection carried down to the peritenon.  This was freed.  There was noted to be thickened peritenon with adhesion of the peritenon at the distal aspect of the Achilles.  This was then retracted.  This demonstrated a large thickened Achilles tendon from the insertion to just  distal to the soleal region.  A midline incision was made within the Achilles tendon.  Thick fibrotic tissue was noted within the intratendinous region and this was excised.  The area was then infiltrated with a Paragon wand.  The tendon was then reapproximated with a 3-0 Monocryl.  Further debridement with the Topaz microdebrider was performed to the superficial portion of the tendon.  The peritenon was closed with a 3-0 Monocryl as was the subcutaneous tissue and the skin closed with a 4-0 Monocryl.  Attention was then directed laterally along the peroneal tendon region where an incision was made proximal to the fibula coursing to the fifth metatarsal base region.  Sharp and blunt dissection carried down to the tendon sheath.  The tendon sheath with then entered.  Large amount of synovitis and tenosynovitis was noted and this was excised.  At this time a peroneal brevis longitudinal tear was noted.  The smaller portion of the peroneal tear was excised and sent for pathological examination.  The remainder of the tendon was debrided of nonviable and fibrotic tissue.  With use of a 3-0 PDS a tubularization of the tendon was then performed with good stability noted.  The peroneal longus tendon was noted to have severe tenosynovitis and a tenolysis procedure was performed of this.  It was a low-lying peroneus brevis muscle belly and this was debrided back.  The wound was flushed with copious amounts of irrigation.  Closure of the tendon sheath was performed with a 3-0 Monocryl.  A small pants over vest 3-0 PDS was placed within the peroneal retinaculum.  Subcutaneous tissue  was closed with a 3-0 Monocryl and the skin closed with a 3-0 nylon.  The patient was then placed in the supine position.  The ankle arthroscopy was then performed.  The anteromedial and anterolateral portals were made.  The ankle joint was infiltrated.  There was noted to be a large amount of synovitis in the small shaver was used to remove the  synovitis.  Inspection of the tibiotalar joint did not reveal any severe osteochondral lesions.  Specifically along the lateral talar dome no obvious osteochondral lesion was noted.  There was a moderate size osteophyte on the anterior aspect of the ankle off the tibia.  With the use of an osteotome and a power bur I was able to remove the osteophyte and smoothed this down.  The wound was then flushed 1 final time.  No further debris was noted.  This was then closed with a 3-0 nylon.  Attention was then directed to the first MTPJ where a dorsal incision was performed.  Sharp and blunt dissection carried down to the capsule.  Longitudinal capsulotomy was performed.  This exposed the metatarsophalangeal joint.  Marked degenerative changes of the metatarsophalangeal joint were noted with minimal articular cartilage.  The small remaining articular cartilage was removed with a cup and cone reamer.  This was further contoured with a rongeur and power rasp.  The joint was prepped with a 2.0 mm drill bit.  It was stabilized with a K wire and a 5 degree valgus 5 degree dorsiflexed position and a small fusion plate was placed dorsally.  A compressive screw was placed within the plate and proximal and distal screws were placed.  These were 2.5 millimeter screws.  Good alignment stability was noted in all planes.  The wound was flushed with copious amounts irrigation with closure performed with a 3-0 Monocryl for the capsule and subcutaneous tissue and a 4-0 Monocryl for the skin.  A bulky sterile dressing was applied.  Patient was placed in neutral position in an equalizer walker boot.    Patient tolerated the procedure and anesthesia well.  Was transported from the OR to the PACU with all vital signs stable and vascular status intact. To be discharged per routine protocol.  Will follow up in approximately 1 week in the outpatient clinic.

## 2023-08-02 ENCOUNTER — Other Ambulatory Visit (HOSPITAL_COMMUNITY): Payer: Self-pay

## 2023-08-02 ENCOUNTER — Encounter: Payer: Self-pay | Admitting: Podiatry

## 2023-08-02 ENCOUNTER — Other Ambulatory Visit: Payer: Self-pay

## 2023-08-02 LAB — SURGICAL PATHOLOGY

## 2023-08-02 MED ORDER — ONDANSETRON 4 MG PO TBDP
4.0000 mg | ORAL_TABLET | Freq: Three times a day (TID) | ORAL | 0 refills | Status: AC | PRN
  Filled 2023-08-02: qty 20, 7d supply, fill #0

## 2023-08-03 ENCOUNTER — Other Ambulatory Visit: Payer: Self-pay

## 2023-08-03 ENCOUNTER — Other Ambulatory Visit (HOSPITAL_COMMUNITY): Payer: Self-pay

## 2023-08-05 DIAGNOSIS — E118 Type 2 diabetes mellitus with unspecified complications: Secondary | ICD-10-CM | POA: Diagnosis not present

## 2023-08-05 DIAGNOSIS — D6800 Von Willebrand disease, unspecified: Secondary | ICD-10-CM | POA: Diagnosis not present

## 2023-08-05 DIAGNOSIS — M7662 Achilles tendinitis, left leg: Secondary | ICD-10-CM | POA: Diagnosis not present

## 2023-08-05 DIAGNOSIS — R3129 Other microscopic hematuria: Secondary | ICD-10-CM | POA: Diagnosis not present

## 2023-08-05 DIAGNOSIS — E7849 Other hyperlipidemia: Secondary | ICD-10-CM | POA: Diagnosis not present

## 2023-08-05 DIAGNOSIS — E119 Type 2 diabetes mellitus without complications: Secondary | ICD-10-CM | POA: Diagnosis not present

## 2023-08-07 DIAGNOSIS — G4733 Obstructive sleep apnea (adult) (pediatric): Secondary | ICD-10-CM | POA: Diagnosis not present

## 2023-08-09 ENCOUNTER — Other Ambulatory Visit (HOSPITAL_COMMUNITY): Payer: Self-pay

## 2023-08-10 ENCOUNTER — Other Ambulatory Visit (HOSPITAL_COMMUNITY): Payer: Self-pay

## 2023-08-10 ENCOUNTER — Other Ambulatory Visit: Payer: Self-pay

## 2023-08-10 MED ORDER — CYCLOSPORINE 0.05 % OP EMUL
1.0000 [drp] | Freq: Two times a day (BID) | OPHTHALMIC | 99 refills | Status: AC
  Filled 2023-08-10: qty 180, 90d supply, fill #0
  Filled 2023-10-21: qty 180, 90d supply, fill #1
  Filled 2024-03-16: qty 180, 90d supply, fill #2

## 2023-08-12 ENCOUNTER — Other Ambulatory Visit
Admission: RE | Admit: 2023-08-12 | Discharge: 2023-08-12 | Disposition: A | Discharge status: Home / Self Care | Payer: 59 | Source: Ambulatory Visit | Attending: Podiatry | Admitting: Podiatry

## 2023-08-12 DIAGNOSIS — R600 Localized edema: Secondary | ICD-10-CM | POA: Diagnosis not present

## 2023-08-12 DIAGNOSIS — M7662 Achilles tendinitis, left leg: Secondary | ICD-10-CM | POA: Insufficient documentation

## 2023-08-12 LAB — D-DIMER, QUANTITATIVE: D-Dimer, Quant: 0.7 ug{FEU}/mL — ABNORMAL HIGH (ref 0.00–0.50)

## 2023-08-13 ENCOUNTER — Ambulatory Visit
Admission: RE | Admit: 2023-08-13 | Discharge: 2023-08-13 | Disposition: A | Discharge status: Home / Self Care | Payer: 59 | Source: Ambulatory Visit | Attending: Podiatry | Admitting: Podiatry

## 2023-08-13 ENCOUNTER — Other Ambulatory Visit: Payer: Self-pay | Admitting: Podiatry

## 2023-08-13 DIAGNOSIS — R600 Localized edema: Secondary | ICD-10-CM | POA: Insufficient documentation

## 2023-08-13 DIAGNOSIS — M7989 Other specified soft tissue disorders: Secondary | ICD-10-CM | POA: Diagnosis not present

## 2023-08-13 DIAGNOSIS — M79605 Pain in left leg: Secondary | ICD-10-CM | POA: Diagnosis not present

## 2023-08-26 DIAGNOSIS — E118 Type 2 diabetes mellitus with unspecified complications: Secondary | ICD-10-CM | POA: Diagnosis not present

## 2023-08-26 DIAGNOSIS — D352 Benign neoplasm of pituitary gland: Secondary | ICD-10-CM | POA: Diagnosis not present

## 2023-08-26 DIAGNOSIS — G4733 Obstructive sleep apnea (adult) (pediatric): Secondary | ICD-10-CM | POA: Diagnosis not present

## 2023-08-26 DIAGNOSIS — Z6841 Body Mass Index (BMI) 40.0 and over, adult: Secondary | ICD-10-CM | POA: Diagnosis not present

## 2023-08-26 DIAGNOSIS — D72820 Lymphocytosis (symptomatic): Secondary | ICD-10-CM | POA: Diagnosis not present

## 2023-08-26 DIAGNOSIS — D6800 Von Willebrand disease, unspecified: Secondary | ICD-10-CM | POA: Diagnosis not present

## 2023-08-26 DIAGNOSIS — Z0000 Encounter for general adult medical examination without abnormal findings: Secondary | ICD-10-CM | POA: Diagnosis not present

## 2023-08-26 DIAGNOSIS — I10 Essential (primary) hypertension: Secondary | ICD-10-CM | POA: Diagnosis not present

## 2023-08-26 DIAGNOSIS — E559 Vitamin D deficiency, unspecified: Secondary | ICD-10-CM | POA: Diagnosis not present

## 2023-08-26 DIAGNOSIS — E7849 Other hyperlipidemia: Secondary | ICD-10-CM | POA: Diagnosis not present

## 2023-08-27 ENCOUNTER — Other Ambulatory Visit: Payer: Self-pay | Admitting: "Endocrinology

## 2023-08-27 ENCOUNTER — Other Ambulatory Visit (HOSPITAL_COMMUNITY): Payer: Self-pay | Admitting: Urgent Care

## 2023-08-27 ENCOUNTER — Other Ambulatory Visit (HOSPITAL_COMMUNITY): Payer: Self-pay

## 2023-08-27 ENCOUNTER — Other Ambulatory Visit: Payer: Self-pay

## 2023-08-27 MED ORDER — PANTOPRAZOLE SODIUM 40 MG PO TBEC
40.0000 mg | DELAYED_RELEASE_TABLET | Freq: Two times a day (BID) | ORAL | 3 refills | Status: DC
  Filled 2023-08-27: qty 60, 30d supply, fill #0
  Filled 2023-11-20: qty 60, 30d supply, fill #1
  Filled 2024-01-30: qty 60, 30d supply, fill #2
  Filled 2024-02-28: qty 60, 30d supply, fill #3

## 2023-08-30 ENCOUNTER — Other Ambulatory Visit (HOSPITAL_COMMUNITY): Payer: Self-pay

## 2023-08-30 ENCOUNTER — Other Ambulatory Visit: Payer: Self-pay

## 2023-08-30 MED ORDER — METFORMIN HCL ER 500 MG PO TB24
1000.0000 mg | ORAL_TABLET | Freq: Every day | ORAL | 0 refills | Status: DC
  Filled 2023-08-30: qty 180, 90d supply, fill #0

## 2023-08-31 ENCOUNTER — Other Ambulatory Visit (HOSPITAL_COMMUNITY): Payer: Self-pay

## 2023-08-31 DIAGNOSIS — M205X2 Other deformities of toe(s) (acquired), left foot: Secondary | ICD-10-CM | POA: Diagnosis not present

## 2023-08-31 DIAGNOSIS — M79672 Pain in left foot: Secondary | ICD-10-CM | POA: Diagnosis not present

## 2023-09-06 DIAGNOSIS — G4733 Obstructive sleep apnea (adult) (pediatric): Secondary | ICD-10-CM | POA: Diagnosis not present

## 2023-09-08 ENCOUNTER — Other Ambulatory Visit (HOSPITAL_COMMUNITY): Payer: Self-pay

## 2023-09-10 ENCOUNTER — Other Ambulatory Visit: Payer: Self-pay | Admitting: Obstetrics and Gynecology

## 2023-09-10 ENCOUNTER — Other Ambulatory Visit (HOSPITAL_COMMUNITY): Payer: Self-pay

## 2023-09-10 ENCOUNTER — Other Ambulatory Visit: Payer: Self-pay

## 2023-09-10 DIAGNOSIS — N898 Other specified noninflammatory disorders of vagina: Secondary | ICD-10-CM | POA: Diagnosis not present

## 2023-09-10 DIAGNOSIS — Z803 Family history of malignant neoplasm of breast: Secondary | ICD-10-CM | POA: Diagnosis not present

## 2023-09-10 DIAGNOSIS — Z1211 Encounter for screening for malignant neoplasm of colon: Secondary | ICD-10-CM | POA: Diagnosis not present

## 2023-09-10 DIAGNOSIS — Z01411 Encounter for gynecological examination (general) (routine) with abnormal findings: Secondary | ICD-10-CM | POA: Diagnosis not present

## 2023-09-10 DIAGNOSIS — Z1231 Encounter for screening mammogram for malignant neoplasm of breast: Secondary | ICD-10-CM

## 2023-09-10 DIAGNOSIS — Z1239 Encounter for other screening for malignant neoplasm of breast: Secondary | ICD-10-CM | POA: Diagnosis not present

## 2023-09-10 DIAGNOSIS — B3731 Acute candidiasis of vulva and vagina: Secondary | ICD-10-CM | POA: Diagnosis not present

## 2023-09-10 MED ORDER — FLUCONAZOLE 150 MG PO TABS
ORAL_TABLET | ORAL | 1 refills | Status: DC
  Filled 2023-09-10: qty 2, 7d supply, fill #0
  Filled 2024-01-18: qty 2, 7d supply, fill #1

## 2023-09-14 ENCOUNTER — Ambulatory Visit: Payer: 59 | Attending: Internal Medicine | Admitting: Internal Medicine

## 2023-09-14 ENCOUNTER — Other Ambulatory Visit (HOSPITAL_COMMUNITY): Payer: Self-pay

## 2023-09-14 ENCOUNTER — Other Ambulatory Visit: Payer: Self-pay

## 2023-09-14 ENCOUNTER — Encounter: Payer: Self-pay | Admitting: Internal Medicine

## 2023-09-14 VITALS — BP 118/76 | HR 84 | Ht 62.0 in | Wt 230.0 lb

## 2023-09-14 DIAGNOSIS — R0789 Other chest pain: Secondary | ICD-10-CM

## 2023-09-14 DIAGNOSIS — R002 Palpitations: Secondary | ICD-10-CM | POA: Diagnosis not present

## 2023-09-14 DIAGNOSIS — G4730 Sleep apnea, unspecified: Secondary | ICD-10-CM

## 2023-09-14 MED ORDER — METOPROLOL TARTRATE 25 MG PO TABS
25.0000 mg | ORAL_TABLET | Freq: Every day | ORAL | 3 refills | Status: DC | PRN
  Filled 2023-09-14: qty 90, 90d supply, fill #0
  Filled 2023-11-20: qty 90, 90d supply, fill #1

## 2023-09-14 NOTE — Patient Instructions (Signed)
Medication Instructions:  Your physician has recommended you make the following change in your medication:   -Start Metoprolol Tartrate 25 mg once daily as needed.   *If you need a refill on your cardiac medications before your next appointment, please call your pharmacy*   Lab Work: None  If you have labs (blood work) drawn today and your tests are completely normal, you will receive your results only by: MyChart Message (if you have MyChart) OR A paper copy in the mail If you have any lab test that is abnormal or we need to change your treatment, we will call you to review the results.   Testing/Procedures: None   Follow-Up: At [facility], you and your health needs are our priority.  As part of our continuing mission to provide you with exceptional heart care, we have created designated Provider Care Teams.  These Care Teams include your primary Cardiologist (physician) and Advanced Practice Providers (APPs -  Physician Assistants and Nurse Practitioners) who all work together to provide you with the care you need, when you need it.  We recommend signing up for the patient portal called "MyChart".  Sign up information is provided on this After Visit Summary.  MyChart is used to connect with patients for Virtual Visits (Telemedicine).  Patients are able to view lab/test results, encounter notes, upcoming appointments, etc.  Non-urgent messages can be sent to your provider as well.   To learn more about what you can do with MyChart, go to [email or web address].    Your next appointment:    Follow up as needed.   Provider:   You may see Vishnu P Mallipeddi, MD or one of the following Advanced Practice Providers on your designated Care Team:   [place], PA-C  Jacolyn Reedy, [place]     Other Instructions

## 2023-09-14 NOTE — Progress Notes (Signed)
Cardiology Office Note  Date: 09/14/2023   ID: Heather Burnett, DOB Jan 15, 1971, MRN [id number]  PCP:  Lynnea Ferrier, MD  Cardiologist:  Marjo Bicker, MD Electrophysiologist:  None    History of Present Illness: Heather Burnett is a 52 y.o. female known to have DM 2 presented to cardiology clinic for follow-up visit.  Patient was prior patient of Dr. Purvis Sheffield.  She was initially referred to cardiology clinic for evaluation of dyspnea exertion and abnormal EKG.  Nuclear stress test in 2019 showed no evidence of ischemia and normal LVEF.  Echocardiogram demonstrated normal LVEF, 60 to 65% and diastolic function could not be assessed.  Coronary CT angiography showed no CAD with a calcium score of 0 in 2020.  Cardiac PET scan study with low risk, normal global myocardial blood flow reserve and normal myocardial blood flow at rest and stress. She is here for follow-up visit.  She continues to have chest pains both with anxiety and exertion. Associated with SOB.  Metoprolol tartrate was prescribed last clinic visit for palpitations but this did not help.  She describes palpitations as pounding sensation and not heart racing.  No dizziness, syncope or leg swelling.  Past Medical History:  Diagnosis Date   Achilles tendinitis of left lower extremity    Allergy    Anxiety    Asthma    WELL CONTROLLED   Back pain    Cancer (HCC)    melanoma on my chest   Chest pain    Constipation    COPD (chronic obstructive pulmonary disease) (HCC)    not sure about COPD    DDD (degenerative disc disease), cervical    and lower back   Depression    Diabetes mellitus, type II (HCC)    Diverticulitis    DJD (degenerative joint disease)    Dysrhythmia    Empty sella syndrome (HCC)    followed at [facility]   Empty sella syndrome [facility])    Family history of adverse reaction to anesthesia    mother almost died with anesthesia in the past, hard to wake up and got real sick on my stomach vomiting,  now she receives local anethesia.   GERD (gastroesophageal reflux disease)    Hallux limitus of left foot    Headache    history of migraine   Heart murmur    Hiatal hernia    History of methicillin resistant staphylococcus aureus (MRSA)    History of palpitations 07/2004   Hyperlipidemia    Hypertension    Hypothyroidism    Hypothyroidism, adult 02/08/2020   Idiopathic peripheral neuropathy    Joint pain    Lactose intolerance    Lymphocytosis    Murmur    Neuropathy 2016   Osteoporosis    Palpitations    PCOS (polycystic ovarian syndrome)    Pituitary microadenoma (HCC)    followed at [facility]   Pneumonia 2008   Pre-diabetes    Sleep apnea    USES CPAP   Swallowing difficulty    Tachycardia    Thyroid disease    Vitamin D deficiency    Von Willebrand disease (HCC) 2012   PT STATES SHE HAS NEVER HAD ANY ISSUES WITH BLEEDING    Past Surgical History:  Procedure Laterality Date   ACHILLES TENDON SURGERY Left 12/15/2019   Procedure: ACHILLES TENDON REPAIR PRIMARY;  Surgeon: Gwyneth Revels, DPM;  Location: [facility] ORS;  Service: Podiatry;  Laterality: Left;   ACHILLES TENDON SURGERY Left 07/30/2023  Procedure: 40981 ACHILLES REPAIR SECONDARY;  Surgeon: Gwyneth Revels, DPM;  Location: [facility] ORS;  Service: Orthopedics/Podiatry;  Laterality: Left;   ANKLE ARTHROSCOPY Left 07/30/2023   Procedure: ANKLE ARTHROSCOPY - OSTEOCHONDRITIS DISSECANS REPAIR;  Surgeon: Gwyneth Revels, DPM;  Location: [facility] ORS;  Service: Orthopedics/Podiatry;  Laterality: Left;   ANTERIOR CERVICAL DECOMP/DISCECTOMY FUSION N/A 10/02/2020   Procedure: ANTERIOR CERVICAL DECOMPRESSION/DISCECTOMY FUSION 1 LEVEL C6-7;  Surgeon: Venetia Night, MD;  Location: [facility] ORS;  Service: Neurosurgery;  Laterality: N/A;   ARTHRODESIS METATARSALPHALANGEAL JOINT (MTPJ) Left 07/30/2023   Procedure: ARTHRODESIS METATARSALPHALANGEAL JOINT (MTPJ) FIRST;  Surgeon: Gwyneth Revels, DPM;  Location: [facility] ORS;  Service:  Orthopedics/Podiatry;  Laterality: Left;   BACK SURGERY     removal of 2 lumbar vertebrae   BREAST BIOPSY Right 2010   NEG   CHOLECYSTECTOMY     COLONOSCOPY  03/03/2013, 02/06/2008   COLONOSCOPY N/A 08/26/2022   Procedure: COLONOSCOPY;  Surgeon: Toledo, Boykin Nearing, MD;  Location: [facility] ENDOSCOPY;  Service: Gastroenterology;  Laterality: N/A;   COLONOSCOPY WITH PROPOFOL N/A 08/05/2018   Procedure: COLONOSCOPY WITH PROPOFOL;  Surgeon: Scot Jun, MD;  Location: [facility] ENDOSCOPY;  Service: Endoscopy;  Laterality: N/A;   EGD  02/06/2008   ESOPHAGOGASTRODUODENOSCOPY N/A 06/10/2022   Procedure: ESOPHAGOGASTRODUODENOSCOPY (EGD);  Surgeon: Toledo, Boykin Nearing, MD;  Location: [facility] ENDOSCOPY;  Service: Gastroenterology;  Laterality: N/A;   GASTROC RECESSION EXTREMITY Left 12/15/2019   Procedure: GASTROC RECESSION EXTREMITY;  Surgeon: Gwyneth Revels, DPM;  Location: [facility] ORS;  Service: Podiatry;  Laterality: Left;   HALLUX VALGUS CHEILECTOMY Left 08/21/2016   Procedure: HALLUX VALGUS CHEILECTOMY;  Surgeon: Linus Galas, DPM;  Location: [facility] ORS;  Service: Podiatry;  Laterality: Left;   HEMORRHOID SURGERY  10/2014   SPINE SURGERY     TENDON REPAIR Left 07/30/2023   Procedure: 19147 FLEXOR TENDON REPAIR  SECOND;  Surgeon: Gwyneth Revels, DPM;  Location: [facility] ORS;  Service: Orthopedics/Podiatry;  Laterality: Left;   TONSILLECTOMY AND ADENOIDECTOMY      Current Outpatient Medications  Medication Sig Dispense Refill   acetaminophen (TYLENOL) 500 MG tablet Take 500 mg by mouth every 6 (six) hours as needed for mild pain or headache.      albuterol (VENTOLIN HFA) 108 (90 Base) MCG/ACT inhaler Inhale 2 inhalations into the lungs every 6 (six) hours as needed for Wheezing 18 g 11   atorvastatin (LIPITOR) 40 MG tablet Take 1 tablet (40 mg total) by mouth once daily 30 tablet 11   Blood Pressure Monitoring (OMRON 3 SERIES BP MONITOR) DEVI use as directed 1 each 0   cetirizine (ZYRTEC) 10 MG tablet Take 10 mg by  mouth daily as needed (seasonal allergies.).      Cholecalciferol (VITAMIN D-3) 125 MCG (5000 UT) TABS Take 2 tablets by mouth daily.     clonazePAM (KLONOPIN) 0.5 MG tablet Take 0.5 mg by mouth daily as needed for anxiety.     Continuous Blood Gluc Receiver (DEXCOM G7 RECEIVER) DEVI Use to test Blood glucose 4 times daily 1 each 0   Continuous Glucose Sensor (DEXCOM G7 SENSOR) MISC Change sensor every 10 days 9 each 1   cyclobenzaprine (FLEXERIL) 5 MG tablet Take 1-2 tablets (5-10 mg total) by mouth 3 (three) times daily as needed for muscle pain. 120 tablet 5   cycloSPORINE (RESTASIS) 0.05 % ophthalmic emulsion Place 1 drop into both eyes 2 (two) times daily. 180 each 99   empagliflozin (JARDIANCE) 25 MG TABS tablet Take 1 tablet (25 mg) by mouth daily before  breakfast. 90 tablet 1   fluconazole (DIFLUCAN) 150 MG tablet Take 1 tablet by mouth now, may repeat dose in 1 week 2 tablet 1   fluticasone (FLOVENT HFA) 110 MCG/ACT inhaler Inhale 1 puff by mouth 2 (two) times daily 12 g 11   furosemide (LASIX) 40 MG tablet Take 1 tablet (40 mg total) by mouth daily as needed for edema 30 tablet 5   glucose blood (FREESTYLE LITE) test strip use to test blood glucose once daily as directed 100 each 0   hydrOXYzine (VISTARIL) 25 MG capsule Take 1 capsule (25 mg total) by mouth 2 (two) times daily as needed. 180 capsule 0   Lactase (LACTAID PO) Take by mouth.     lamoTRIgine (LAMICTAL) 200 MG tablet Take 1 tablet (200 mg total) by mouth daily. (Patient taking differently: Take 150 mg by mouth daily.) 90 tablet 0   lamoTRIgine (LAMICTAL) 25 MG tablet Take 2 tablets (50 mg total) by mouth Nightly. 180 tablet 0   levalbuterol (XOPENEX HFA) 45 MCG/ACT inhaler Inhale 2 puffs into the lungs every 6 (six) hours as needed for wheezing 15 g 12   levocetirizine (XYZAL) 5 MG tablet Take 1 tablet by mouth in the evening. 30 tablet 11   levothyroxine (SYNTHROID) 50 MCG tablet Take 1 tablet (50 mcg total) by mouth daily.  (Patient taking differently: Take 88 mcg by mouth daily.) 90 tablet 1   LORazepam (ATIVAN) 0.5 MG tablet Take 1 tablet (0.5 mg total) by mouth daily as needed. 10 tablet 2   LORazepam (ATIVAN) 0.5 MG tablet Take 1 tablet (0.5 mg total) by mouth daily as needed. 10 tablet 2   metFORMIN (GLUCOPHAGE-XR) 500 MG 24 hr tablet Take 2 tablets (1,000 mg total) by mouth daily with breakfast. 180 tablet 0   nitroGLYCERIN (NITROSTAT) 0.4 MG SL tablet Place 1 tablet under the tongue every 5 minutes as needed for chest pain (up to 3 tablets total). If no relief after 3 doses, call 911. 25 tablet 3   pantoprazole (PROTONIX) 40 MG tablet Take 1 tablet (40 mg total) by mouth 2 (two) times daily 30 mins before breakfast and 30 mins before dinner. 60 tablet 3   progesterone (PROMETRIUM) 200 MG capsule Take 2 capsules (400 mg total) by mouth at bedtime. 180 capsule 3   spironolactone (ALDACTONE) 50 MG tablet Take 1 tablet (50 mg total) by mouth daily. 90 tablet 3   ondansetron (ZOFRAN-ODT) 4 MG disintegrating tablet Dissolve 1 tablet (4 mg total) by mouth every 8 (eight) hours as needed for Nausea for up to 7 days (Patient not taking: Reported on 09/14/2023) 20 tablet 0   oxyCODONE-acetaminophen (PERCOCET) 5-325 MG tablet Take 1-2 tablets by mouth every 6 (six) hours as needed for severe pain. Max 6 tablets daily. (Patient not taking: Reported on 09/14/2023) 30 tablet 0   No current facility-administered medications for this visit.   Allergies:  Liraglutide -weight management, Polysorbate, Sorbitan, Adhesive [tape], Latex, and Penicillins   Social History: The patient  reports that she quit smoking about 29 years ago. Her smoking use included cigarettes. She started smoking about 37 years ago. She has never used smokeless tobacco. She reports current alcohol use. She reports that she does not use drugs.   Family History: The patient's family history includes Breast cancer (age of onset: 54) in her sister; Breast cancer  (age of onset: 72) in her maternal aunt; Breast cancer (age of onset: 82) in her maternal grandmother; Cancer in her father;  Diabetes in her father; Heart attack in her father; Hyperlipidemia in her father; Hypertension in her father; Obesity in her father; Osteoporosis in her mother; Sudden Cardiac Death in her father; Thyroid disease in her mother.   ROS:  Please see the history of present illness. Otherwise, complete review of systems is positive for none.  All other systems are reviewed and negative.   Physical Exam: VS:  BP 118/76   Pulse 84   Ht 5\' 2"  (1.575 m)   Wt 230 lb (104.3 kg)   SpO2 95%   BMI 42.07 kg/m , BMI Body mass index is 42.07 kg/m.  Wt Readings from Last 3 Encounters:  09/14/23 230 lb (104.3 kg)  05/06/23 237 lb 9.6 oz (107.8 kg)  02/04/23 250 lb 12.8 oz (113.8 kg)    General: Patient appears comfortable at rest. HEENT: Conjunctiva and lids normal, oropharynx clear with moist mucosa. Neck: Supple, no elevated JVP or carotid bruits, no thyromegaly. Lungs: Clear to auscultation, nonlabored breathing at rest. Cardiac: Regular rate and rhythm, no S3 or significant systolic murmur, no pericardial rub. Abdomen: Soft, nontender, no hepatomegaly, bowel sounds present, no guarding or rebound. Extremities: No pitting edema, distal pulses 2+. Skin: Warm and dry. Musculoskeletal: No kyphosis. Neuropsychiatric: Alert and oriented x3, affect grossly appropriate.  ECG:  NSR  Recent Labwork: 04/29/2023: ALT 16; AST 18; TSH 1.068 07/23/2023: BUN 19; Creatinine, Ser 0.77; Hemoglobin 14.0; Platelets 456; Potassium 4.1; Sodium 137     Component Value Date/Time   CHOL 141 04/29/2023 0730   CHOL 147 02/10/2022 0847   TRIG 213 (H) 04/29/2023 0730   HDL 32 (L) 04/29/2023 0730   HDL 46 02/10/2022 0847   CHOLHDL 4.4 04/29/2023 0730   VLDL 43 (H) 04/29/2023 0730   LDLCALC 66 04/29/2023 0730   LDLCALC 71 02/10/2022 0847   LDLCALC 95 04/08/2021 1659    Other Studies Reviewed  Today: Echo from 2019 LVEF preserved Diastolic function could not be assessed No valve abnormalities  NM stress test from 2019 No evidence of ischemia LVEF normal  Coronary CTA in 2020 Coronary calcium score is 0  Assessment and Plan: Patient is a 51 year old F known to have DM 2, OSA on CPAP presented to cardiology clinic for follow-up visit.  Noncardiac chest pain Palpitations (Describes as heart pounding and not heart racing) OSA on CPAP   -Patient had extensive cardiac workup including echocardiogram, CT cardiac and cardiac PET scan which were normal.  Coronary calcium score was 0.  She continues to have chest pain both with anxiety and exertion.  Unclear etiology.  Metoprolol was started in the last clinic visit with no improvement in her palpitations, she describes them as pounding sensation, will switch metoprolol to as needed medication.  Continue CPAP.   I have spent a total of 20 minutes discussing the imaging study results, management of chest pain and palpitations.  Documenting the findings note.   Medication Adjustments/Labs and Tests Ordered: Current medicines are reviewed at length with the patient today.  Concerns regarding medicines are outlined above.   Tests Ordered: No orders of the defined types were placed in this encounter.   Medication Changes: No orders of the defined types were placed in this encounter.   Disposition:  Follow up  as needed  Signed, Odalis Jordan Verne Spurr, MD, 09/14/2023 8:53 AM    [facility] at [facility] [address], [place], [place] [address]

## 2023-09-15 ENCOUNTER — Encounter: Payer: Self-pay | Admitting: Obstetrics and Gynecology

## 2023-09-22 ENCOUNTER — Encounter: Payer: Self-pay | Admitting: "Endocrinology

## 2023-09-22 ENCOUNTER — Ambulatory Visit (INDEPENDENT_AMBULATORY_CARE_PROVIDER_SITE_OTHER): Payer: 59 | Admitting: "Endocrinology

## 2023-09-22 VITALS — BP 130/68 | HR 76 | Ht 62.0 in | Wt 229.8 lb

## 2023-09-22 DIAGNOSIS — Z7984 Long term (current) use of oral hypoglycemic drugs: Secondary | ICD-10-CM | POA: Insufficient documentation

## 2023-09-22 DIAGNOSIS — Z79899 Other long term (current) drug therapy: Secondary | ICD-10-CM | POA: Diagnosis not present

## 2023-09-22 DIAGNOSIS — E119 Type 2 diabetes mellitus without complications: Secondary | ICD-10-CM

## 2023-09-22 DIAGNOSIS — E782 Mixed hyperlipidemia: Secondary | ICD-10-CM | POA: Diagnosis not present

## 2023-09-22 DIAGNOSIS — Z6841 Body Mass Index (BMI) 40.0 and over, adult: Secondary | ICD-10-CM | POA: Diagnosis not present

## 2023-09-22 DIAGNOSIS — E66813 Obesity, class 3: Secondary | ICD-10-CM

## 2023-09-22 DIAGNOSIS — E039 Hypothyroidism, unspecified: Secondary | ICD-10-CM | POA: Diagnosis not present

## 2023-09-22 LAB — POCT GLYCOSYLATED HEMOGLOBIN (HGB A1C)

## 2023-09-22 MED ORDER — LEVOTHYROXINE SODIUM 88 MCG PO TABS: 88.0000 ug | ORAL_TABLET | Freq: Every day | ORAL | 1 refills | Status: DC

## 2023-09-22 NOTE — Progress Notes (Signed)
09/22/2023, 5:12 PM   Endocrinology follow-up note  Subjective:    Patient ID: Heather Burnett, female    DOB: 1971/08/12, PCP Lynnea Ferrier, MD   Past Medical History:  Diagnosis Date   Achilles tendinitis of left lower extremity    Allergy    Anxiety    Asthma    WELL CONTROLLED   Back pain    Cancer (HCC)    melanoma on my chest   Chest pain    Constipation    COPD (chronic obstructive pulmonary disease) (HCC)    not sure about COPD    DDD (degenerative disc disease), cervical    and lower back   Depression    Diabetes mellitus, type II (HCC)    Diverticulitis    DJD (degenerative joint disease)    Dysrhythmia    Empty sella syndrome (HCC)    followed at [facility]   Empty sella syndrome [facility])    Family history of adverse reaction to anesthesia    mother almost died with anesthesia in the past, hard to wake up and got real sick on my stomach vomiting, now she receives local anethesia.   GERD (gastroesophageal reflux disease)    Hallux limitus of left foot    Headache    history of migraine   Heart murmur    Hiatal hernia    History of methicillin resistant staphylococcus aureus (MRSA)    History of palpitations 07/2004   Hyperlipidemia    Hypertension    Hypothyroidism    Hypothyroidism, adult 02/08/2020   Idiopathic peripheral neuropathy    Joint pain    Lactose intolerance    Lymphocytosis    Murmur    Neuropathy 2016   Osteoporosis    Palpitations    PCOS (polycystic ovarian syndrome)    Pituitary microadenoma (HCC)    followed at [facility]   Pneumonia 2008   Pre-diabetes    Sleep apnea    USES CPAP   Swallowing difficulty    Tachycardia    Thyroid disease    Vitamin D deficiency    Von Willebrand disease (HCC) 2012   PT STATES SHE HAS NEVER HAD ANY ISSUES WITH BLEEDING   Past Surgical History:  Procedure Laterality Date   ACHILLES TENDON SURGERY Left 12/15/2019   Procedure: ACHILLES TENDON  REPAIR PRIMARY;  Surgeon: Gwyneth Revels, DPM;  Location: [facility] ORS;  Service: Podiatry;  Laterality: Left;   ACHILLES TENDON SURGERY Left 07/30/2023   Procedure: [id number] ACHILLES REPAIR SECONDARY;  Surgeon: Gwyneth Revels, DPM;  Location: [facility] ORS;  Service: Orthopedics/Podiatry;  Laterality: Left;   ANKLE ARTHROSCOPY Left 07/30/2023   Procedure: ANKLE ARTHROSCOPY - OSTEOCHONDRITIS DISSECANS REPAIR;  Surgeon: Gwyneth Revels, DPM;  Location: [facility] ORS;  Service: Orthopedics/Podiatry;  Laterality: Left;   ANTERIOR CERVICAL DECOMP/DISCECTOMY FUSION N/A 10/02/2020   Procedure: ANTERIOR CERVICAL DECOMPRESSION/DISCECTOMY FUSION 1 LEVEL C6-7;  Surgeon: Venetia Night, MD;  Location: [facility] ORS;  Service: Neurosurgery;  Laterality: N/A;   ARTHRODESIS METATARSALPHALANGEAL JOINT (MTPJ) Left 07/30/2023   Procedure: ARTHRODESIS METATARSALPHALANGEAL JOINT (MTPJ) FIRST;  Surgeon: Gwyneth Revels, DPM;  Location: [facility] ORS;  Service: Orthopedics/Podiatry;  Laterality: Left;   BACK SURGERY     removal of 2 lumbar  vertebrae   BREAST BIOPSY Right 2010   NEG   CHOLECYSTECTOMY     COLONOSCOPY  03/03/2013, 02/06/2008   COLONOSCOPY N/A 08/26/2022   Procedure: COLONOSCOPY;  Surgeon: Toledo, Boykin Nearing, MD;  Location: [facility] ENDOSCOPY;  Service: Gastroenterology;  Laterality: N/A;   COLONOSCOPY WITH PROPOFOL N/A 08/05/2018   Procedure: COLONOSCOPY WITH PROPOFOL;  Surgeon: Scot Jun, MD;  Location: [facility] ENDOSCOPY;  Service: Endoscopy;  Laterality: N/A;   EGD  02/06/2008   ESOPHAGOGASTRODUODENOSCOPY N/A 06/10/2022   Procedure: ESOPHAGOGASTRODUODENOSCOPY (EGD);  Surgeon: Toledo, Boykin Nearing, MD;  Location: [facility] ENDOSCOPY;  Service: Gastroenterology;  Laterality: N/A;   GASTROC RECESSION EXTREMITY Left 12/15/2019   Procedure: GASTROC RECESSION EXTREMITY;  Surgeon: Gwyneth Revels, DPM;  Location: [facility] OR[facility];  Service: Podiatry;  Laterality: Left;   HALLUX VALGUS CHEILECTOMY Left 08/21/2016   Procedure: HALLUX VALGUS  CHEILECTOMY;  Surgeon: Linus Galas, DPM;  Location: [facility] ORS;  Service: Podiatry;  Laterality: Left;   HEMORRHOID SURGERY  10/2014   SPINE SURGERY     TENDON REPAIR Left 07/30/2023   Procedure: [id number] FLEXOR TENDON REPAIR  SECOND;  Surgeon: Gwyneth Revels, DPM;  Location: [facility] ORS;  Service: Orthopedics/Podiatry;  Laterality: Left;   TONSILLECTOMY AND ADENOIDECTOMY     Social History   Socioeconomic History   Marital status: Divorced    Spouse name: Not on file   Number of children: Not on file   Years of education: Not on file   Highest education level: Not on file  Occupational History   Occupation: CMA [employer]  Tobacco Use   Smoking status: Former    Current packs/day: 0.00    Types: Cigarettes    Start date: 04/09/1986    Quit date: 04/09/1994    Years since quitting: 29.4   Smokeless tobacco: Never  Vaping Use   Vaping status: Never Used  Substance and Sexual Activity   Alcohol use: Yes    Comment: social   Drug use: No   Sexual activity: Not on file  Other Topics Concern   Not on file  Social History Narrative   Divorced since 2009.Lives with 50 year old mum.CMA at [employer].   Social Determinants of Health   Financial Resource Strain: Low Risk  (09/10/2023)   Received from [facility]   Overall Financial Resource Strain (CARDIA)    Difficulty of Paying Living Expenses: Not very hard  Food Insecurity: No Food Insecurity (09/10/2023)   Received from [facility]   Hunger Vital Sign    Worried About Running Out of Food in the Last Year: Never true    Ran Out of Food in the Last Year: Never true  Transportation Needs: No Transportation Needs (08/26/2023)   Received from [facility] - Transportation    In the past 12 months, has lack of transportation kept you from medical appointments or from getting medications?: No    Lack of Transportation (Non-Medical): No  Physical Activity: Not on file  Stress:  Not on file  Social Connections: Not on file   Family History  Problem Relation Age of Onset   Thyroid disease Mother    Osteoporosis Mother    Diabetes Father    Hyperlipidemia Father    Hypertension Father    Cancer Father    Heart attack Father    Sudden Cardiac Death Father    Obesity Father    Breast cancer Sister 47   Breast cancer Maternal Grandmother 66  Breast cancer Maternal Aunt 50   Outpatient Encounter Medications as of 09/22/2023  Medication Sig   ondansetron (ZOFRAN-ODT) 4 MG disintegrating tablet Dissolve 1 tablet (4 mg total) by mouth every 8 (eight) hours as needed for Nausea for up to 7 days   acetaminophen (TYLENOL) 500 MG tablet Take 500 mg by mouth every 6 (six) hours as needed for mild pain or headache.    albuterol (VENTOLIN HFA) 108 (90 Base) MCG/ACT inhaler Inhale 2 inhalations into the lungs every 6 (six) hours as needed for Wheezing   atorvastatin (LIPITOR) 40 MG tablet Take 1 tablet (40 mg total) by mouth once daily   Blood Pressure Monitoring (OMRON 3 SERIES BP MONITOR) DEVI use as directed   cetirizine (ZYRTEC) 10 MG tablet Take 10 mg by mouth daily as needed (seasonal allergies.).    Cholecalciferol (VITAMIN D-3) 125 MCG (5000 UT) TABS Take 2 tablets by mouth daily.   clonazePAM (KLONOPIN) 0.5 MG tablet Take 0.5 mg by mouth daily as needed for anxiety.   Continuous Blood Gluc Receiver (DEXCOM G7 RECEIVER) DEVI Use to test Blood glucose 4 times daily   Continuous Glucose Sensor (DEXCOM G7 SENSOR) MISC Change sensor every 10 days   cyclobenzaprine (FLEXERIL) 5 MG tablet Take 1-2 tablets (5-10 mg total) by mouth 3 (three) times daily as needed for muscle pain.   cycloSPORINE (RESTASIS) 0.05 % ophthalmic emulsion Place 1 drop into both eyes 2 (two) times daily.   empagliflozin (JARDIANCE) 25 MG TABS tablet Take 1 tablet (25 mg) by mouth daily before breakfast.   fluconazole (DIFLUCAN) 150 MG tablet Take 1 tablet by mouth now, may repeat dose in 1 week    fluticasone (FLOVENT HFA) 110 MCG/ACT inhaler Inhale 1 puff by mouth 2 (two) times daily   furosemide (LASIX) 40 MG tablet Take 1 tablet (40 mg total) by mouth daily as needed for edema   hydrOXYzine (VISTARIL) 25 MG capsule Take 1 capsule (25 mg total) by mouth 2 (two) times daily as needed.   Lactase (LACTAID PO) Take by mouth.   lamoTRIgine (LAMICTAL) 200 MG tablet Take 1 tablet (200 mg total) by mouth daily. (Patient taking differently: Take 150 mg by mouth daily.)   lamoTRIgine (LAMICTAL) 25 MG tablet Take 2 tablets (50 mg total) by mouth Nightly.   levalbuterol (XOPENEX HFA) 45 MCG/ACT inhaler Inhale 2 puffs into the lungs every 6 (six) hours as needed for wheezing   levocetirizine (XYZAL) 5 MG tablet Take 1 tablet by mouth in the evening.   levothyroxine (SYNTHROID) 50 MCG tablet Take 1 tablet (50 mcg total) by mouth daily. (Patient taking differently: Take 88 mcg by mouth daily.)   LORazepam (ATIVAN) 0.5 MG tablet Take 1 tablet (0.5 mg total) by mouth daily as needed.   LORazepam (ATIVAN) 0.5 MG tablet Take 1 tablet (0.5 mg total) by mouth daily as needed.   metFORMIN (GLUCOPHAGE-XR) 500 MG 24 hr tablet Take 2 tablets (1,000 mg total) by mouth daily with breakfast.   metoprolol tartrate (LOPRESSOR) 25 MG tablet Take 1 tablet (25 mg total) by mouth daily as needed.   nitroGLYCERIN (NITROSTAT) 0.4 MG SL tablet Place 1 tablet under the tongue every 5 minutes as needed for chest pain (up to 3 tablets total). If no relief after 3 doses, call 911.   pantoprazole (PROTONIX) 40 MG tablet Take 1 tablet (40 mg total) by mouth 2 (two) times daily 30 mins before breakfast and 30 mins before dinner.   progesterone (PROMETRIUM) 200 MG  capsule Take 2 capsules (400 mg total) by mouth at bedtime.   spironolactone (ALDACTONE) 50 MG tablet Take 1 tablet (50 mg total) by mouth daily.   [DISCONTINUED] glucose blood (FREESTYLE LITE) test strip use to test blood glucose once daily as directed   [DISCONTINUED]  metoCLOPramide (REGLAN) 10 MG tablet Take 1 tablet (10 mg total) by mouth 3 (three) times daily as needed   [DISCONTINUED] oxyCODONE-acetaminophen (PERCOCET) 5-325 MG tablet Take 1-2 tablets by mouth every 6 (six) hours as needed for severe pain. Max 6 tablets daily. (Patient not taking: Reported on 09/14/2023)   No facility-administered encounter medications on file as of 09/22/2023.   ALLERGIES: Allergies  Allergen Reactions   Liraglutide -Weight Management Other (See Comments)    GI upset   Polysorbate    Sorbitan Other (See Comments)    GI upset   Adhesive [Tape] Itching and Rash    Paper tape or coban is easier on the skin.   Latex Rash   Penicillins Rash    Updated on 09/26/2020 following APP conversation with patient.  Swelling of the face/tongue/throat, SOB, or low BP? NO Sudden or severe rash/hives, skin peeling, or any reaction on the inside of your mouth or nose? NO - low severity rash to body.  Seek medical attention at a hospital or doctor's office? Yes When did it last happen? Age 51 following "a shot of PCN".       If all above answers are "NO", may proceed with cephalosporin use. - Has taken cefdinir and cephalexin without issues.      VACCINATION STATUS: Immunization History  Administered Date(s) Administered   Influenza Split 08/02/2015, 07/19/2018   Influenza,inj,Quad PF,6+ Mos 07/22/2017   Influenza-Unspecified 06/19/2020   Tdap 11/12/2010, 04/08/2021    HPI KHIARA OHERN is 53 y.o. female who presents today for follow-up after she was seen in consultation for hypothyroidism, type 2 diabetes.   See notes from previous visit.  She was diagnosed with hypothyroidism at approximate age of 59 years.   She was treated with various doses of thyroid hormone over the years.  Due to problems stabilizing her thyroid function tests she was switched from NP thyroid to levothyroxine several visits ago.   She is currently on levothyroxine 88 mcg p.o. daily before  breakfast.      She reports good consistency.  Her last thyroid function test were consistent with appropriate replacement.  She denies any exposure to thyroid iodine ablation nor thyroidectomy.   she denies dysphagia, shortness of breath nor voice change.  She has family history of various types of thyroid dysfunction, however no thyroid malignancy.  Her recent thyroid ultrasound was unremarkable.  She also has type 2 diabetes currently on metformin 1000 mg p.o. daily and Jardiance 25 mg p.o. once a day.  She presents with her Dexcom CGM showing 95% time in range, 5% level 1 hyperglycemia.  Her point-of-care A1c is 6.3% improving from 7.2% during her last visit.  She presents with significant weight loss.     Review of Systems  Constitutional:  + Fluctuating body weight, + fatigue, + subjective hyperthermia, no subjective hypothermia   Objective:       09/22/2023    3:50 PM 09/14/2023    8:34 AM 07/30/2023    2:23 PM  Vitals with BMI  Height 5\' 2"  5\' 2"    Weight 229 lbs 13 oz 230 lbs   BMI 42.02 42.06   Systolic 130 118 518  Diastolic 68 76 56  Pulse 76 84 72    BP 130/68   Pulse 76   Ht 5\' 2"  (1.575 m)   Wt 229 lb 12.8 oz (104.2 kg)   BMI 42.03 kg/m   Wt Readings from Last 3 Encounters:  09/22/23 229 lb 12.8 oz (104.2 kg)  09/14/23 230 lb (104.3 kg)  05/06/23 237 lb 9.6 oz (107.8 kg)    Physical Exam  Constitutional:  Body mass index is 42.03 kg/m.,  not in acute distress, normal state of mind Eyes: PERRLA, EOMI, no exophthalmos ENT: moist mucous membranes, no gross thyromegaly, no gross cervical lymphadenopathy   CMP ( most recent) CMP     Component Value Date/Time   NA 137 07/23/2023 1158   NA 140 02/10/2022 0847   K 4.1 07/23/2023 1158   CL 102 07/23/2023 1158   CO2 25 07/23/2023 1158   GLUCOSE 105 (H) 07/23/2023 1158   BUN 19 07/23/2023 1158   BUN 9 02/10/2022 0847   CREATININE 0.77 07/23/2023 1158   CREATININE 0.79 04/08/2021 1659   CALCIUM 9.5  07/23/2023 1158   PROT 6.9 04/29/2023 0730   PROT 6.6 02/10/2022 0847   ALBUMIN 3.9 04/29/2023 0730   ALBUMIN 4.3 02/10/2022 0847   AST 18 04/29/2023 0730   ALT 16 04/29/2023 0730   ALKPHOS 59 04/29/2023 0730   BILITOT 0.5 04/29/2023 0730   BILITOT 0.4 02/10/2022 0847   GFRNONAA >60 07/23/2023 1158   GFRNONAA 89 04/08/2021 1659   GFRAA 103 04/08/2021 1659     Diabetic Labs (most recent): Lab Results  Component Value Date   HGBA1C 7.2 (H) 04/29/2023   HGBA1C 8.0 (A) 02/04/2023   HGBA1C 7.8 (A) 11/05/2022   MICROALBUR <0.2 01/06/2021   MICROALBUR 20.3 (H) 07/11/2015     Lipid Panel ( most recent) Lipid Panel     Component Value Date/Time   CHOL 141 04/29/2023 0730   CHOL 147 02/10/2022 0847   TRIG 213 (H) 04/29/2023 0730   HDL 32 (L) 04/29/2023 0730   HDL 46 02/10/2022 0847   CHOLHDL 4.4 04/29/2023 0730   VLDL 43 (H) 04/29/2023 0730   LDLCALC 66 04/29/2023 0730   LDLCALC 71 02/10/2022 0847   LDLCALC 95 04/08/2021 1659   LABVLDL 30 02/10/2022 0847      Lab Results  Component Value Date   TSH 1.068 04/29/2023   TSH 0.796 11/03/2022   TSH 1.155 08/03/2022   TSH 0.073 (L) 05/01/2022   TSH 0.310 (L) 02/10/2022   TSH 0.741 10/22/2021   TSH <0.01 (L) 04/08/2021   TSH 1.137 07/21/2018   TSH 0.944 07/11/2015   TSH 1.191 04/30/2015   FREET4 0.87 04/29/2023   FREET4 0.74 11/03/2022   FREET4 0.62 08/03/2022   FREET4 0.86 05/01/2022   FREET4 1.37 02/10/2022   FREET4 0.83 10/22/2021      Thyroid ultrasound October 24, 2021  There is a small subcentimeter anechoic cystic nodule in the right mid thyroid lobe. This nodule does NOT meet TI-RADS criteria for biopsy or dedicated follow-up.   IMPRESSION: Normal size and sonographic appearance of the thyroid gland.   Assessment & Plan:   1. Hypothyroidism 2.  Type 2 diabetes 3.  Hyperlipidemia 4.  Morbid obesity  Her previsit thyroid function tests are consistent with appropriate replacement.  She is advised  to continue levothyroxine 50 mcg p.o. daily before breakfast.  - We discussed about the correct intake of her thyroid hormone, on empty stomach at fasting, with water, separated by at least 30  minutes from breakfast and other medications,  and separated by more than 4 hours from calcium, iron, multivitamins, acid reflux medications (PPIs). -Patient is made aware of the fact that thyroid hormone replacement is needed for life, dose to be adjusted by periodic monitoring of thyroid function tests.  Her baseline thyroid ultrasound is unremarkable. Regarding her metabolic dysfunction including type 2 diabetes hyperlipidemia, hypertension, morbid obesity, she remains a good candidate for lifestyle medicine.  - she acknowledges that there is a room for improvement in her food and drink choices. - Suggestion is made for her to avoid simple carbohydrates  from her diet including Cakes, Sweet Desserts, Ice Cream, Soda (diet and regular), Sweet Tea, Candies, Chips, Cookies, Store Bought Juices, Alcohol , Artificial Sweeteners,  Coffee Creamer, and "Sugar-free" Products, Lemonade. This will help patient to have more stable blood glucose profile and potentially avoid unintended weight gain.  The following Lifestyle Medicine recommendations according to American College of Lifestyle Medicine  [facility]) were discussed and and offered to patient and she  agrees to start the journey:  A. Whole Foods, Plant-Based Nutrition comprising of fruits and vegetables, plant-based proteins, whole-grain carbohydrates was discussed in detail with the patient.   A list for source of those nutrients were also provided to the patient.  Patient will use only water or unsweetened tea for hydration. B.  The need to stay away from risky substances including alcohol, smoking; obtaining 7 to 9 hours of restorative sleep, at least 150 minutes of moderate intensity exercise weekly, the importance of healthy social connections,  and stress  management techniques were discussed. C.  A full color page of  Calorie density of various food groups per pound showing examples of each food groups was provided to the patient.   She is responding to her current medication regimen.  I advised her to continue metformin 1000 mg p.o. daily at breakfast, Jardiance 25 mg p.o. daily at breakfast as well.    -In light of her polypharmacy, even with partial engagement, she would benefit the most from lifestyle medicine. She is advised to continue atorvastatin 40 mg p.o. nightly for hyperlipidemia.  Atorvastatin 40 mg p.o. nightly for hyperlipidemia.   Side effects and precautions discussed with her.  She is advised to maintain close follow-up with her PMD.   I spent  28  minutes in the care of the patient today including review of labs from CMP, Lipids, Thyroid Function, Hematology (current and previous including abstractions from other facilities); face-to-face time discussing  her blood glucose readings/logs, discussing hypoglycemia and hyperglycemia episodes and symptoms, medications doses, her options of short and long term treatment based on the latest standards of care / guidelines;  discussion about incorporating lifestyle medicine;  and documenting the encounter. Risk reduction counseling performed per USPSTF guidelines to reduce  obesity and cardiovascular risk factors.     Please refer to Patient Instructions for Blood Glucose Monitoring and Insulin/Medications Dosing Guide"  in media tab for additional information. Please  also refer to " Patient Self Inventory" in the Media  tab for reviewed elements of pertinent patient history.  Heather Burnett participated in the discussions, expressed understanding, and voiced agreement with the above plans.  All questions were answered to her satisfaction. she is encouraged to contact clinic should she have any questions or concerns prior to her return visit.    Follow up plan: Return in about 4  months (around 01/21/2024) for F/U with Pre-visit Labs, Meter/CGM/Logs, A1c here.   Porfirio Mylar  Fransico Him, MD [facility] [address] [place], [place] [address] Phone: [phone]  Fax: [phone]     09/22/2023, 5:12 PM  This note was partially dictated with voice recognition software. Similar sounding words can be transcribed inadequately or may not  be corrected upon review.

## 2023-09-22 NOTE — Patient Instructions (Signed)

## 2023-09-24 ENCOUNTER — Other Ambulatory Visit (HOSPITAL_COMMUNITY): Payer: Self-pay

## 2023-09-24 ENCOUNTER — Other Ambulatory Visit: Payer: Self-pay

## 2023-09-24 DIAGNOSIS — L814 Other melanin hyperpigmentation: Secondary | ICD-10-CM | POA: Diagnosis not present

## 2023-09-24 DIAGNOSIS — L718 Other rosacea: Secondary | ICD-10-CM | POA: Diagnosis not present

## 2023-09-24 DIAGNOSIS — L820 Inflamed seborrheic keratosis: Secondary | ICD-10-CM | POA: Diagnosis not present

## 2023-09-24 DIAGNOSIS — R58 Hemorrhage, not elsewhere classified: Secondary | ICD-10-CM | POA: Diagnosis not present

## 2023-09-24 DIAGNOSIS — L84 Corns and callosities: Secondary | ICD-10-CM | POA: Diagnosis not present

## 2023-09-24 DIAGNOSIS — Z85828 Personal history of other malignant neoplasm of skin: Secondary | ICD-10-CM | POA: Diagnosis not present

## 2023-09-24 DIAGNOSIS — L538 Other specified erythematous conditions: Secondary | ICD-10-CM | POA: Diagnosis not present

## 2023-09-24 DIAGNOSIS — L821 Other seborrheic keratosis: Secondary | ICD-10-CM | POA: Diagnosis not present

## 2023-09-24 DIAGNOSIS — D225 Melanocytic nevi of trunk: Secondary | ICD-10-CM | POA: Diagnosis not present

## 2023-09-24 DIAGNOSIS — Z08 Encounter for follow-up examination after completed treatment for malignant neoplasm: Secondary | ICD-10-CM | POA: Diagnosis not present

## 2023-09-24 MED ORDER — METRONIDAZOLE 0.75 % EX CREA
1.0000 | TOPICAL_CREAM | Freq: Two times a day (BID) | CUTANEOUS | 3 refills | Status: AC
  Filled 2023-09-24: qty 45, 23d supply, fill #0
  Filled 2023-11-20: qty 45, 23d supply, fill #1
  Filled 2024-01-30: qty 45, 23d supply, fill #2
  Filled 2024-02-28: qty 45, 23d supply, fill #3

## 2023-10-05 ENCOUNTER — Inpatient Hospital Stay: Payer: 59

## 2023-10-05 ENCOUNTER — Inpatient Hospital Stay: Payer: 59 | Attending: Oncology | Admitting: Licensed Clinical Social Worker

## 2023-10-05 ENCOUNTER — Encounter: Payer: Self-pay | Admitting: Licensed Clinical Social Worker

## 2023-10-05 DIAGNOSIS — Z8041 Family history of malignant neoplasm of ovary: Secondary | ICD-10-CM | POA: Diagnosis not present

## 2023-10-05 DIAGNOSIS — Z803 Family history of malignant neoplasm of breast: Secondary | ICD-10-CM | POA: Diagnosis not present

## 2023-10-05 DIAGNOSIS — Z800 Family history of malignant neoplasm of digestive organs: Secondary | ICD-10-CM | POA: Diagnosis not present

## 2023-10-05 DIAGNOSIS — Z86006 Personal history of melanoma in-situ: Secondary | ICD-10-CM

## 2023-10-05 NOTE — Progress Notes (Signed)
REFERRING PROVIDER: Schermerhorn, Ihor Austin, MD [address] [facility] [place],  [place] [address]  PRIMARY PROVIDER:  Lynnea Ferrier, MD  PRIMARY REASON FOR VISIT:  1. Family history of breast cancer      HISTORY OF PRESENT ILLNESS:   Heather Burnett, a 52 y.o. female, was seen for a [place] cancer genetics consultation at the request of Dr. Feliberto Gottron due to a family history of cancer.  Heather Burnett presents to clinic today to discuss the possibility of a hereditary predisposition to cancer, genetic testing, and to further clarify her future cancer risks, as well as potential cancer risks for family members.   CANCER HISTORY:  Heather Burnett is a 52 y.o. female with no personal history of cancer aside from skin cancer, one melanoma on her chest and basal cell carcinoma. She also reports pituitary microadenoma.  RISK FACTORS:  Menarche was at age 51 or 85.  First live birth at age 41.  Ovaries intact: yes.  Hysterectomy: no.  Menopausal status: premenopausal.  Colonoscopy: yes;  reports cumulative 22 polyps . Mammogram within the last year: yes. Number of breast biopsies: 1.  Past Medical History:  Diagnosis Date   Achilles tendinitis of left lower extremity    Allergy    Anxiety    Asthma    WELL CONTROLLED   Back pain    Cancer (HCC)    melanoma on my chest   Chest pain    Constipation    COPD (chronic obstructive pulmonary disease) (HCC)    not sure about COPD    DDD (degenerative disc disease), cervical    and lower back   Depression    Diabetes mellitus, type II (HCC)    Diverticulitis    DJD (degenerative joint disease)    Dysrhythmia    Empty sella syndrome (HCC)    followed at [facility]   Empty sella syndrome [facility])    Family history of adverse reaction to anesthesia    mother almost died with anesthesia in the past, hard to wake up and got real sick on my stomach vomiting, now she receives local anethesia.   GERD (gastroesophageal reflux  disease)    Hallux limitus of left foot    Headache    history of migraine   Heart murmur    Hiatal hernia    History of methicillin resistant staphylococcus aureus (MRSA)    History of palpitations 07/2004   Hyperlipidemia    Hypertension    Hypothyroidism    Hypothyroidism, adult 02/08/2020   Idiopathic peripheral neuropathy    Joint pain    Lactose intolerance    Lymphocytosis    Murmur    Neuropathy 2016   Osteoporosis    Palpitations    PCOS (polycystic ovarian syndrome)    Pituitary microadenoma (HCC)    followed at [facility]   Pneumonia 2008   Pre-diabetes    Sleep apnea    USES CPAP   Swallowing difficulty    Tachycardia    Thyroid disease    Vitamin D deficiency    Von Willebrand disease (HCC) 2012   PT STATES SHE HAS NEVER HAD ANY ISSUES WITH BLEEDING    Past Surgical History:  Procedure Laterality Date   ACHILLES TENDON SURGERY Left 12/15/2019   Procedure: ACHILLES TENDON REPAIR PRIMARY;  Surgeon: Gwyneth Revels, DPM;  Location: [facility] ORS;  Service: Podiatry;  Laterality: Left;   ACHILLES TENDON SURGERY Left 07/30/2023   Procedure: 47829 ACHILLES REPAIR SECONDARY;  Surgeon:  Gwyneth Revels, DPM;  Location: [facility] ORS;  Service: Orthopedics/Podiatry;  Laterality: Left;   ANKLE ARTHROSCOPY Left 07/30/2023   Procedure: ANKLE ARTHROSCOPY - OSTEOCHONDRITIS DISSECANS REPAIR;  Surgeon: Gwyneth Revels, DPM;  Location: [facility] ORS;  Service: Orthopedics/Podiatry;  Laterality: Left;   ANTERIOR CERVICAL DECOMP/DISCECTOMY FUSION N/A 10/02/2020   Procedure: ANTERIOR CERVICAL DECOMPRESSION/DISCECTOMY FUSION 1 LEVEL C6-7;  Surgeon: Venetia Night, MD;  Location: [facility] ORS;  Service: Neurosurgery;  Laterality: N/A;   ARTHRODESIS METATARSALPHALANGEAL JOINT (MTPJ) Left 07/30/2023   Procedure: ARTHRODESIS METATARSALPHALANGEAL JOINT (MTPJ) FIRST;  Surgeon: Gwyneth Revels, DPM;  Location: [facility] ORS;  Service: Orthopedics/Podiatry;  Laterality: Left;   BACK SURGERY     removal of 2 lumbar  vertebrae   BREAST BIOPSY Right 2010   NEG   CHOLECYSTECTOMY     COLONOSCOPY  03/03/2013, 02/06/2008   COLONOSCOPY N/A 08/26/2022   Procedure: COLONOSCOPY;  Surgeon: Toledo, Boykin Nearing, MD;  Location: [facility] ENDOSCOPY;  Service: Gastroenterology;  Laterality: N/A;   COLONOSCOPY WITH PROPOFOL N/A 08/05/2018   Procedure: COLONOSCOPY WITH PROPOFOL;  Surgeon: Scot Jun, MD;  Location[facility];  Service: Endoscopy;  Laterality: N/A;   EGD  02/06/2008   ESOPHAGOGASTRODUODENOSCOPY N/A 06/10/2022   Procedure: ESOPHAGOGASTRODUODENOSCOPY (EGD);  Surgeon: Toledo, Boykin Nearing, MD;  Location: [facility] ENDOSCOPY;  Service: Gastroenterology;  Laterality: N/A;   GASTROC RECESSION EXTREMITY Left 12/15/2019   Procedure: GASTROC RECESSION EXTREMITY;  Surgeon: Gwyneth Revels, DPM;  Location: [facility] ORS;  Service: Podiatry;  Laterality: Left;   HALLUX VALGUS CHEILECTOMY Left 08/21/2016   Procedure: HALLUX VALGUS CHEILECTOMY;  Surgeon: Linus Galas, DPM;  Location: [facility] ORS;  Service: Podiatry;  Laterality: Left;   HEMORRHOID SURGERY  10/2014   SPINE SURGERY     TENDON REPAIR Left 07/30/2023   Procedure: [id number] FLEXOR TENDON REPAIR  SECOND;  Surgeon: Gwyneth Revels, DPM;  Location: [facility] ORS;  Service: Orthopedics/Podiatry;  Laterality: Left;   TONSILLECTOMY AND ADENOIDECTOMY      FAMILY HISTORY:  We obtained a detailed, 4-generation family history.  Significant diagnoses are listed below: Family History  Problem Relation Age of Onset   Thyroid disease Mother    Osteoporosis Mother    Diabetes Father    Hyperlipidemia Father    Hypertension Father    Skin cancer Father    Heart attack Father    Sudden Cardiac Death Father    Obesity Father    Breast cancer Sister 78   Lung cancer Maternal Uncle    Lung cancer Maternal Uncle    Cancer Paternal Aunt        unk type   Brain cancer Paternal Uncle    Lung cancer Paternal Uncle    Ovarian cancer Maternal Grandmother        dx >50   Colon cancer Maternal  Grandmother        dx >50   Bladder Cancer Maternal Grandmother        dx >50   Breast cancer Other    Colon cancer Half-Brother 2   Ovarian cancer Cousin        dx late 12s    Heather Burnett has 1 daughter, 65. She has 2 maternal half sisters, 2 maternal half brothers. One half sister had breast cancer at 21 and is living in her 61s, no known genetic testing. One half brother had colon cancer at 39 and is living in his 88s.   Heather Burnett mother is living at 13. Two maternal uncles passed of lung cancer, another uncle had unknown  cancer. Maternal grandmother had colon, ovarian and bladder cancer (all separate cancers, all diagnosed over age 58) and passed at 42. Her sister, patient's great aunt, had breast cancer.  Heather Burnett father had history of skin cancer. He passed at 62. Paternal uncle passed of brain cancer, another uncle passed of lung cancer. An aunt passed of unknown cancer and her daughter, patient's paternal cousin, currently has ovarian cancer in her late 90s.   Heather Burnett is unaware of previous family history of genetic testing for hereditary cancer risks. There is no reported Ashkenazi Jewish ancestry. There is no known consanguinity.    GENETIC COUNSELING ASSESSMENT: Heather Burnett is a 52 y.o. female with a family history of breast cancer which is somewhat suggestive of a hereditary cancer syndrome and predisposition to cancer. We, therefore, discussed and recommended the following at today's visit.   DISCUSSION: We discussed that approximately 10% of breast cancer is hereditary. Most cases of hereditary breast cancer are associated with BRCA1/2 genes, although there are other genes associated with hereditary cancer as well. Cancers and risks are gene specific. We discussed that testing is beneficial for several reasons including knowing about cancer risks, identifying potential screening and risk-reduction options that may be appropriate, and to understand if other family members  could be at risk for cancer and allow them to undergo genetic testing.   We reviewed the characteristics, features and inheritance patterns of hereditary cancer syndromes. We also discussed genetic testing, including the appropriate family members to test, the process of testing, insurance coverage and turn-around-time for results. We discussed the implications of a negative, positive and/or variant of uncertain significant result. We recommended Heather Burnett pursue genetic testing for the [facility] CancerNext-Expanded+RNA gene panel.   Based on Ms. Kitner's family history of cancer, she meets medical criteria for genetic testing. Though Heather Burnett is not personally affected, there are no affected family members that are willing/able to undergo hereditary cancer testing.  Therefore, Heather Burnett is the most informative family member available. Despite that she meets criteria, she may still have an out of pocket cost.   We discussed that some people do not want to undergo genetic testing due to fear of genetic discrimination.  A federal law called the Genetic Information Non-Discrimination Act (GINA) of 2008 helps protect individuals against genetic discrimination based on their genetic test results.  It impacts both health insurance and employment.  For health insurance, it protects against increased premiums, being kicked off insurance or being forced to take a test in order to be insured.  For employment it protects against hiring, firing and promoting decisions based on genetic test results.  Health status due to a cancer diagnosis is not protected under GINA.  This law does not protect life insurance, disability insurance, or other types of insurance.   PLAN: After considering the risks, benefits, and limitations, Heather Burnett provided informed consent to pursue genetic testing and the blood sample was sent to [facility] for analysis of the CancerNext-Expanded+RNA panel. Results should be available within  approximately 2-3 weeks' time, at which point they will be disclosed by telephone to Heather Burnett, as will any additional recommendations warranted by these results. Heather Burnett will receive a summary of her genetic counseling visit and a copy of her results once available. This information will also be available in Epic.   Heather Burnett questions were answered to her satisfaction today. Our contact information was provided should additional questions or concerns arise. Thank you for the referral and  allowing [place] to share in the care of your patient.   Lacy Duverney, MS, [facility] Genetic Counselor [email or web address] Phone: [phone]  The patient was seen for a total of 23 minutes in face-to-face genetic counseling.  Dr. Blake Divine was available for discussion regarding this case.   _______________________________________________________________________ For Office Staff:  Number of people involved in session: 1 Was an Intern/ student involved with case: no

## 2023-10-06 DIAGNOSIS — G4733 Obstructive sleep apnea (adult) (pediatric): Secondary | ICD-10-CM | POA: Diagnosis not present

## 2023-10-08 ENCOUNTER — Other Ambulatory Visit: Payer: Self-pay

## 2023-10-08 ENCOUNTER — Other Ambulatory Visit (HOSPITAL_COMMUNITY): Payer: Self-pay

## 2023-10-08 DIAGNOSIS — F331 Major depressive disorder, recurrent, moderate: Secondary | ICD-10-CM | POA: Diagnosis not present

## 2023-10-08 DIAGNOSIS — F419 Anxiety disorder, unspecified: Secondary | ICD-10-CM | POA: Diagnosis not present

## 2023-10-08 DIAGNOSIS — F4312 Post-traumatic stress disorder, chronic: Secondary | ICD-10-CM | POA: Diagnosis not present

## 2023-10-08 DIAGNOSIS — F5105 Insomnia due to other mental disorder: Secondary | ICD-10-CM | POA: Diagnosis not present

## 2023-10-08 MED ORDER — HYDROXYZINE PAMOATE 25 MG PO CAPS
25.0000 mg | ORAL_CAPSULE | Freq: Two times a day (BID) | ORAL | 0 refills | Status: DC | PRN
  Filled 2023-10-08: qty 180, 90d supply, fill #0

## 2023-10-08 MED ORDER — LAMOTRIGINE 25 MG PO TABS
50.0000 mg | ORAL_TABLET | Freq: Every evening | ORAL | 0 refills | Status: DC
  Filled 2023-10-08: qty 180, 90d supply, fill #0

## 2023-10-08 MED ORDER — LAMOTRIGINE 200 MG PO TABS
200.0000 mg | ORAL_TABLET | Freq: Every day | ORAL | 0 refills | Status: DC
  Filled 2023-10-08: qty 90, 90d supply, fill #0

## 2023-10-08 MED ORDER — LORAZEPAM 0.5 MG PO TABS
0.5000 mg | ORAL_TABLET | Freq: Every day | ORAL | 2 refills | Status: DC | PRN
  Filled 2023-10-08: qty 10, 10d supply, fill #0

## 2023-10-19 ENCOUNTER — Other Ambulatory Visit (HOSPITAL_COMMUNITY): Payer: Self-pay

## 2023-10-19 DIAGNOSIS — M79672 Pain in left foot: Secondary | ICD-10-CM | POA: Diagnosis not present

## 2023-10-19 DIAGNOSIS — K645 Perianal venous thrombosis: Secondary | ICD-10-CM | POA: Diagnosis not present

## 2023-10-19 MED ORDER — HYDROCORTISONE (PERIANAL) 2.5 % EX CREA
1.0000 | TOPICAL_CREAM | Freq: Three times a day (TID) | CUTANEOUS | 0 refills | Status: AC
  Filled 2023-10-19: qty 30, 7d supply, fill #0

## 2023-10-21 ENCOUNTER — Other Ambulatory Visit (HOSPITAL_COMMUNITY): Payer: Self-pay

## 2023-10-22 ENCOUNTER — Other Ambulatory Visit: Payer: Self-pay

## 2023-10-22 ENCOUNTER — Other Ambulatory Visit (HOSPITAL_COMMUNITY): Payer: Self-pay

## 2023-10-29 ENCOUNTER — Ambulatory Visit: Payer: Self-pay | Admitting: Licensed Clinical Social Worker

## 2023-10-29 ENCOUNTER — Ambulatory Visit
Admission: RE | Admit: 2023-10-29 | Discharge: 2023-10-29 | Disposition: A | Discharge status: Home / Self Care | Payer: Commercial Managed Care - PPO | Source: Ambulatory Visit | Attending: Obstetrics and Gynecology | Admitting: Obstetrics and Gynecology

## 2023-10-29 ENCOUNTER — Encounter: Payer: Self-pay | Admitting: Licensed Clinical Social Worker

## 2023-10-29 DIAGNOSIS — Z1379 Encounter for other screening for genetic and chromosomal anomalies: Secondary | ICD-10-CM

## 2023-10-29 DIAGNOSIS — Z1231 Encounter for screening mammogram for malignant neoplasm of breast: Secondary | ICD-10-CM | POA: Diagnosis not present

## 2023-10-29 NOTE — Progress Notes (Signed)
 HPI:   Heather Burnett was previously seen in the [facility] due to a family history of cancer and concerns regarding a hereditary predisposition to cancer. Please refer to our prior cancer genetics clinic note for more information regarding our discussion, assessment and recommendations, at the time. Heather Burnett recent genetic test results were disclosed to her, as were recommendations warranted by these results. These results and recommendations are discussed in more detail below.  CANCER HISTORY:  Oncology History   No history exists.    FAMILY HISTORY:  We obtained a detailed, 4-generation family history.  Significant diagnoses are listed below: Family History  Problem Relation Age of Onset   Thyroid  disease Mother    Osteoporosis Mother    Diabetes Father    Hyperlipidemia Father    Hypertension Father    Skin cancer Father    Heart attack Father    Sudden Cardiac Death Father    Obesity Father    Breast cancer Sister 32   Lung cancer Maternal Uncle    Lung cancer Maternal Uncle    Cancer Paternal Aunt        unk type   Brain cancer Paternal Uncle    Lung cancer Paternal Uncle    Ovarian cancer Maternal Grandmother        dx >50   Colon cancer Maternal Grandmother        dx >50   Bladder Cancer Maternal Grandmother        dx >50   Breast cancer Other    Colon cancer Half-Brother 27   Ovarian cancer Cousin        dx late 60s    Heather Burnett has 1 daughter, 52. She has 2 maternal half sisters, 2 maternal half brothers. One half sister had breast cancer at 47 and is living in her 102s, no known genetic testing. One half brother had colon cancer at 95 and is living in his 62s.    Heather Burnett mother is living at 87. Two maternal uncles passed of lung cancer, another uncle had unknown cancer. Maternal grandmother had colon, ovarian and bladder cancer (all separate cancers, all diagnosed over age 66) and passed at 48. Her sister, patient's great aunt, had  breast cancer.   Heather Burnett father had history of skin cancer. He passed at 25. Paternal uncle passed of brain cancer, another uncle passed of lung cancer. An aunt passed of unknown cancer and her daughter, patient's paternal cousin, currently has ovarian cancer in her late 23s.    Heather Burnett is unaware of previous family history of genetic testing for hereditary cancer risks. There is no reported Ashkenazi Jewish ancestry. There is no known consanguinity.     GENETIC TEST RESULTS:  The [facility] CancerNext-Expanded+RNA Panel found no pathogenic mutations.   The CancerNext-Expanded gene panel offered by [facility] and includes sequencing, rearrangement, and RNA analysis for the following 76 genes: AIP, ALK, APC, ATM, AXIN2, BAP1, BARD1, BMPR1A, BRCA1, BRCA2, BRIP1, CDC73, CDH1, CDK4, CDKN1B, CDKN2A, CEBPA, CHEK2, CTNNA1, DDX41, DICER1, ETV6, FH, FLCN, GATA2, LZTR1, MAX, MBD4, MEN1, MET, MLH1, MSH2, MSH3, MSH6, MUTYH, NF1, NF2, NTHL1, PALB2, PHOX2B, PMS2, POT1, PRKAR1A, PTCH1, PTEN, RAD51C, RAD51D, RB1, RET, RUNX1, SDHA, SDHAF2, SDHB, SDHC, SDHD, SMAD4, SMARCA4, SMARCB1, SMARCE1, STK11, SUFU, TMEM127, TP53, TSC1, TSC2, VHL, and WT1 (sequencing and deletion/duplication); EGFR, HOXB13, KIT, MITF, PDGFRA, POLD1, and POLE (sequencing only); EPCAM and GREM1 (deletion/duplication only).   The test report has been scanned into EPIC and is  located under the Molecular Pathology section of the Results Review tab.  A portion of the result report is included below for reference. Genetic testing reported out on 10/29/2023.    Even though a pathogenic variant was not identified, possible explanations for the cancer in the family may include: There may be no hereditary risk for cancer in the family. The cancers in Heather Burnett and/or her family may be sporadic/familial or due to other genetic and environmental factors. There may be a gene mutation in one of these genes that current testing methods cannot detect but  that chance is small. There could be another gene that has not yet been discovered, or that we have not yet tested, that is responsible for the cancer diagnoses in the family.  It is also possible there is a hereditary cause for the cancer in the family that Heather Burnett did not inherit. Therefore, it is important to remain in touch with cancer genetics in the future so that we can continue to offer Heather Burnett the most up to date genetic testing.   ADDITIONAL GENETIC TESTING:  We discussed with Heather Burnett that her genetic testing was fairly extensive.  If there are additional relevant genes identified to increase cancer risk that can be analyzed in the future, we would be happy to discuss and coordinate this testing at that time.    CANCER SCREENING RECOMMENDATIONS:  Heather Burnett test result is considered negative (normal).  This means that we have not identified a hereditary cause for her family history of cancer at this time.   An individual's cancer risk and medical management are not determined by genetic test results alone. Overall cancer risk assessment incorporates additional factors, including personal medical history, family history, and any available genetic information that may result in a personalized plan for cancer prevention and surveillance. Therefore, it is recommended she continue to follow the cancer management and screening guidelines provided by her primary healthcare provider.  Based on the reported personal and family history, specific cancer screenings for Heather Burnett and her family include:  Breast Cancer Screening:  The Tyrer-Cuzick model is one of multiple prediction models developed to estimate an individual's lifetime risk of developing breast cancer. The Tyrer-Cuzick model is endorsed by the [employer] (NCCN). This model includes many risk factors such as family history, endogenous estrogen exposure, and benign breast disease. The  calculation is highly-dependent on the accuracy of clinical data provided by the patient and can change over time. The Tyrer-Cuzick model may be repeated to reflect new information in her personal or family history in the future.   Heather Burnett'sTyrer-Cuzick risk score is 6.5%.  She is encouraged to continue to be mindful of her family history and be diligent with general population breast screening, including annual mammograms beginning 10 years prior to the youngest diagnosis in her family or by age 55.   She is encouraged to contact us  regarding any changes to her personal or family history, as her recommendations for screening would be altered significantly if her lifetime risk is determined to be greater than 20% based on updated information.   Colon Cancer Screening: This negative genetic test simply tells us  that we cannot yet define why Heather Burnett has had  an increased number of colorectal polyps. Heather Burnett medical management and screening should be based on the prospect that she  will likely form more colon polyps and should, therefore, undergo more frequent colonoscopy screening at intervals determined by her  GI providers.    RECOMMENDATIONS FOR FAMILY MEMBERS:   Since she did not inherit a identifiable mutation in a cancer predisposition gene included on this panel, her children could not have inherited a known mutation from her in one of these genes. Individuals in this family might be at some increased risk of developing cancer, over the general population risk, due to the family history of cancer.  Individuals in the family should notify their providers of the family history of cancer. We recommend women in this family have a yearly mammogram beginning at age 33, or 48 years younger than the earliest onset of cancer, an annual clinical breast exam, and perform monthly breast self-exams.  Family members should have colonoscopies by at age 67, or earlier, as recommended by their  providers. Other members of the family may still carry a pathogenic variant in one of these genes that Heather Burnett did not inherit. Based on the family history, we recommend her maternal relatives, especially those who have had cancer, have genetic counseling and testing. Heather Burnett will let us  know if we can be of any assistance in coordinating genetic counseling and/or testing for this family member.    FOLLOW-UP:  Lastly, we discussed with Heather Burnett that cancer genetics is a rapidly advancing field and it is possible that new genetic tests will be appropriate for her and/or her family members in the future. We encouraged her to remain in contact with cancer genetics on an annual basis so we can update her personal and family histories and let her know of advances in cancer genetics that may benefit this family.   We have also placed a referral to Dr. Chad Haldeman-Englert for cardiology genetic testing per patient's request.   Our contact number was provided. Ms. Gradel questions were answered to her satisfaction, and she knows she is welcome to call us  at anytime with additional questions or concerns.    Dena Cary, MS, [facility] Genetic Counselor [email or web address] Phone: [phone]

## 2023-11-01 ENCOUNTER — Encounter: Payer: Self-pay | Admitting: Licensed Clinical Social Worker

## 2023-11-06 DIAGNOSIS — G4733 Obstructive sleep apnea (adult) (pediatric): Secondary | ICD-10-CM | POA: Diagnosis not present

## 2023-11-20 ENCOUNTER — Other Ambulatory Visit (HOSPITAL_COMMUNITY): Payer: Self-pay | Admitting: Urgent Care

## 2023-11-20 ENCOUNTER — Other Ambulatory Visit: Payer: Self-pay | Admitting: Internal Medicine

## 2023-11-20 ENCOUNTER — Other Ambulatory Visit: Payer: Self-pay | Admitting: "Endocrinology

## 2023-11-20 ENCOUNTER — Other Ambulatory Visit (HOSPITAL_COMMUNITY): Payer: Self-pay

## 2023-11-22 ENCOUNTER — Other Ambulatory Visit (HOSPITAL_BASED_OUTPATIENT_CLINIC_OR_DEPARTMENT_OTHER): Payer: Self-pay

## 2023-11-22 ENCOUNTER — Other Ambulatory Visit (HOSPITAL_COMMUNITY): Payer: Self-pay

## 2023-11-22 MED ORDER — EMPAGLIFLOZIN 25 MG PO TABS
25.0000 mg | ORAL_TABLET | Freq: Every day | ORAL | 0 refills | Status: DC
  Filled 2023-11-22: qty 90, 90d supply, fill #0

## 2023-11-22 MED ORDER — SPIRONOLACTONE 50 MG PO TABS
50.0000 mg | ORAL_TABLET | Freq: Every day | ORAL | 3 refills | Status: AC
  Filled 2023-11-22: qty 90, 90d supply, fill #0
  Filled 2024-02-28: qty 90, 90d supply, fill #1
  Filled 2024-06-08: qty 90, 90d supply, fill #2
  Filled 2024-08-16 – 2024-09-20 (×2): qty 90, 90d supply, fill #3

## 2023-11-22 MED ORDER — METFORMIN HCL ER 500 MG PO TB24
1000.0000 mg | ORAL_TABLET | Freq: Every day | ORAL | 0 refills | Status: DC
  Filled 2023-11-22: qty 180, 90d supply, fill #0

## 2023-11-23 ENCOUNTER — Other Ambulatory Visit (HOSPITAL_COMMUNITY): Payer: Self-pay

## 2023-11-23 ENCOUNTER — Other Ambulatory Visit: Payer: Self-pay

## 2023-11-23 MED ORDER — PROGESTERONE 200 MG PO CAPS
400.0000 mg | ORAL_CAPSULE | Freq: Every day | ORAL | 1 refills | Status: DC
  Filled 2023-11-23: qty 180, 90d supply, fill #0
  Filled 2024-02-28: qty 180, 90d supply, fill #1

## 2023-11-23 MED ORDER — FUROSEMIDE 40 MG PO TABS
40.0000 mg | ORAL_TABLET | Freq: Every day | ORAL | 5 refills | Status: AC
  Filled 2023-11-23 (×2): qty 30, 30d supply, fill #0
  Filled 2024-01-30: qty 30, 30d supply, fill #1
  Filled 2024-02-28: qty 30, 30d supply, fill #2
  Filled 2024-06-08: qty 30, 30d supply, fill #3
  Filled 2024-08-08: qty 30, 30d supply, fill #4
  Filled 2024-08-16 – 2024-09-20 (×2): qty 30, 30d supply, fill #5

## 2023-12-24 DIAGNOSIS — M79662 Pain in left lower leg: Secondary | ICD-10-CM | POA: Diagnosis not present

## 2023-12-28 ENCOUNTER — Other Ambulatory Visit (HOSPITAL_COMMUNITY): Payer: Self-pay

## 2023-12-30 ENCOUNTER — Other Ambulatory Visit (HOSPITAL_COMMUNITY): Payer: Self-pay | Admitting: Urgent Care

## 2023-12-30 ENCOUNTER — Ambulatory Visit: Payer: Commercial Managed Care - PPO | Admitting: Medical Genetics

## 2023-12-30 VITALS — BP 120/67 | HR 83 | Wt 229.8 lb

## 2023-12-30 DIAGNOSIS — Z8249 Family history of ischemic heart disease and other diseases of the circulatory system: Secondary | ICD-10-CM

## 2023-12-30 DIAGNOSIS — Z1379 Encounter for other screening for genetic and chromosomal anomalies: Secondary | ICD-10-CM | POA: Diagnosis not present

## 2023-12-30 NOTE — Progress Notes (Unsigned)
 MEDICAL GENETICS NEW PATIENT EVALUATION  Patient name: Heather Burnett DOB: 05-Sep-1971 Age: 53 y.o. MRN: [id number]  Referring Provider/Specialty: Twana First, MS, CGC  Date of Evaluation: 12/30/2023 Chief Complaint/Reason for Referral: Family history of early heart disease  Assessment: We discussed with Heather Burnett that it is possible that some of her father's medical issues may be secondary to other health complications, but that his early cardiac disease could also suggest an underlying genetic disorder. Given the extent of the family history of cardiac abnormalities, broad genetic testing would be appropriate at this time. Heather Burnett participated in the [facility] program and had negative testing for 4 genes related to familial hyperlipidemia. However, she was interested in additional testing being performed, and consent and samples were obtained today for several cardiac panel through [facility]. The results are expected in 3-4 weeks, and we will contact her when they return. Additional recommendations will be based on the results of the testing, but she should otherwise continue her care per her current provider's recommendations.  Recommendations: Dyslipidemia, cardiomyopathy, arrhythmia, and aortopathy panels through [facility]. Results expected in 3-4 weeks. Continue follow up with current medical providers per their recommendations.  Follow up will be based on the results of the testing.   HPI: Heather Burnett is a 53 y.o. assigned female at birth who presents today for an initial genetics evaluation for a family history of early heart disease. She provided the history. This information, along with a review of pertinent records, labs, and radiology studies, is summarized below.  Heather Burnett was recently seen for a genetic counseling visit with Twana First, MS, GCG due to a family history of several cancers. Comprehensive cancer genetic testing was performed that was  normal/negative.   In the conversation with Heather Burnett related to the family history, she revealed that her father and other paternal relatives have had significant cardiac issues at a young age. Her father had a heart attack by age 63, and more recently was noted to have ischemic cardiomyopathy and aortic aneurysm. Given these concerns, Heather Burnett was referred to our genetics clinic for further evaluation and testing for genetic causes of cardiac disorders.  Past Medical History: Patient Active Problem List   Diagnosis Date Noted   Genetic testing 10/29/2023   Long term current use of oral hypoglycemic drug 09/22/2023   Insulin long-term use (HCC) 05/06/2023   Polypharmacy 02/05/2023   Palpitations 09/25/2022   Non-cardiac chest pain 09/25/2022   Diabetes mellitus (HCC) 08/03/2022   Gastroesophageal reflux disease 04/07/2022   Class 3 severe obesity with serious comorbidity and body mass index (BMI) of 40.0 to 44.9 in adult (HCC) 04/07/2022   Diabetes mellitus without complication (HCC) 04/01/2022   Empty sella syndrome (HCC) 03/16/2022   Pituitary microadenoma (HCC) 03/16/2022   Sleep apnea 03/16/2022   Von Willebrand's disease (HCC) 03/16/2022   Bilious vomiting with nausea 03/10/2022   Family history of colon cancer 03/10/2022   GERD (gastroesophageal reflux disease) 03/10/2022   Low TSH level 02/11/2022   Iatrogenic thyrotoxicosis 07/14/2021   Type 2 diabetes mellitus with obesity (HCC) 07/14/2021   Hypothyroidism 02/08/2020   Idiopathic peripheral neuropathy 03/09/2018   Hypertension    Hematuria, microscopic 02/12/2017   Dyspnea on exertion 09/26/2015   Tachycardia 09/26/2015   Arthritis, degenerative 07/31/2015   HLD (hyperlipidemia) 07/31/2015   Blood glucose elevated 12/06/2014   Benign essential HTN 03/15/2014   Class 3 severe obesity due to excess calories with serious comorbidity and body  mass index (BMI) of 40.0 to 44.9 in adult [facility]) 03/15/2014   Bilateral  polycystic ovarian syndrome 03/15/2014   Medications: Current Outpatient Medications on File Prior to Visit  Medication Sig Dispense Refill   acetaminophen (TYLENOL) 500 MG tablet Take 500 mg by mouth every 6 (six) hours as needed for mild pain or headache.      albuterol (VENTOLIN HFA) 108 (90 Base) MCG/ACT inhaler Inhale 2 inhalations into the lungs every 6 (six) hours as needed for Wheezing 18 g 11   atorvastatin (LIPITOR) 40 MG tablet Take 1 tablet (40 mg total) by mouth once daily 30 tablet 11   cetirizine (ZYRTEC) 10 MG tablet Take 10 mg by mouth daily as needed (seasonal allergies.).      Cholecalciferol (VITAMIN D-3) 125 MCG (5000 UT) TABS Take 2 tablets by mouth daily.     clonazePAM (KLONOPIN) 0.5 MG tablet Take 0.5 mg by mouth daily as needed for anxiety.     Continuous Blood Gluc Receiver (DEXCOM G7 RECEIVER) DEVI Use to test Blood glucose 4 times daily 1 each 0   Continuous Glucose Sensor (DEXCOM G7 SENSOR) MISC Change sensor every 10 days 9 each 1   cyclobenzaprine (FLEXERIL) 5 MG tablet Take 1-2 tablets (5-10 mg total) by mouth 3 (three) times daily as needed for muscle pain. 120 tablet 5   cycloSPORINE (RESTASIS) 0.05 % ophthalmic emulsion Place 1 drop into both eyes 2 (two) times daily. 180 each 99   empagliflozin (JARDIANCE) 25 MG TABS tablet Take 1 tablet (25 mg) by mouth daily before breakfast. 90 tablet 0   fluconazole (DIFLUCAN) 150 MG tablet Take 1 tablet by mouth now, may repeat dose in 1 week 2 tablet 1   fluticasone (FLOVENT HFA) 110 MCG/ACT inhaler Inhale 1 puff by mouth 2 (two) times daily 12 g 11   furosemide (LASIX) 40 MG tablet Take 1 tablet (40 mg total) by mouth dailyas needed for edema. 30 tablet 5   hydrocortisone (ANUSOL-HC) 2.5 % rectal cream Apply 1 Application to the affected area 3 (three) times daily. 30 g 0   hydrOXYzine (VISTARIL) 25 MG capsule Take 1 capsule (25 mg total) by mouth 2 (two) times daily as needed. 180 capsule 0   hydrOXYzine (VISTARIL) 25  MG capsule Take 1 capsule (25 mg total) by mouth 2 (two) times daily as needed. 180 capsule 0   Lactase (LACTAID PO) Take by mouth.     lamoTRIgine (LAMICTAL) 200 MG tablet Take 1 tablet (200 mg total) by mouth daily. (Patient taking differently: Take 150 mg by mouth daily.) 90 tablet 0   lamoTRIgine (LAMICTAL) 200 MG tablet Take 1 tablet (200 mg total) by mouth daily. 90 tablet 0   lamoTRIgine (LAMICTAL) 25 MG tablet Take 2 tablets (50 mg total) by mouth Nightly. 180 tablet 0   lamoTRIgine (LAMICTAL) 25 MG tablet Take 2 tablets (50 mg total) by mouth Nightly. 180 tablet 0   levalbuterol (XOPENEX HFA) 45 MCG/ACT inhaler Inhale 2 puffs into the lungs every 6 (six) hours as needed for wheezing 15 g 12   levocetirizine (XYZAL) 5 MG tablet Take 1 tablet by mouth in the evening. 30 tablet 11   levothyroxine (SYNTHROID) 88 MCG tablet Take 1 tablet (88 mcg total) by mouth daily. 90 tablet 1   LORazepam (ATIVAN) 0.5 MG tablet Take 1 tablet (0.5 mg total) by mouth daily as needed. 10 tablet 2   LORazepam (ATIVAN) 0.5 MG tablet Take 1 tablet (0.5 mg total) by mouth  daily as needed. 10 tablet 2   LORazepam (ATIVAN) 0.5 MG tablet Take 1 tablet (0.5 mg total) by mouth daily as needed. 10 tablet 2   metFORMIN (GLUCOPHAGE-XR) 500 MG 24 hr tablet Take 2 tablets (1,000 mg total) by mouth daily with breakfast. 180 tablet 0   metoprolol tartrate (LOPRESSOR) 25 MG tablet Take 1 tablet (25 mg total) by mouth daily as needed. 90 tablet 3   metroNIDAZOLE (METROCREAM) 0.75 % cream Apply cream topically to face twice daily 45 g 3   nitroGLYCERIN (NITROSTAT) 0.4 MG SL tablet Place 1 tablet under the tongue every 5 minutes as needed for chest pain (up to 3 tablets total). If no relief after 3 doses, call 911. 25 tablet 3   ondansetron (ZOFRAN-ODT) 4 MG disintegrating tablet Dissolve 1 tablet (4 mg total) by mouth every 8 (eight) hours as needed for Nausea for up to 7 days 20 tablet 0   pantoprazole (PROTONIX) 40 MG tablet  Take 1 tablet (40 mg total) by mouth 2 (two) times daily 30 mins before breakfast and 30 mins before dinner. 60 tablet 3   progesterone (PROMETRIUM) 200 MG capsule Take 2 capsules (400 mg total) by mouth at bedtime. 180 capsule 1   spironolactone (ALDACTONE) 50 MG tablet Take 1 tablet (50 mg total) by mouth daily. 90 tablet 3   [DISCONTINUED] metoCLOPramide (REGLAN) 10 MG tablet Take 1 tablet (10 mg total) by mouth 3 (three) times daily as needed 60 tablet 0   No current facility-administered medications on file prior to visit.   Allergies:  Allergies  Allergen Reactions   Liraglutide -Weight Management Other (See Comments)    GI upset   Polysorbate    Sorbitan Other (See Comments)    GI upset   Adhesive [Tape] Itching and Rash    Paper tape or coban is easier on the skin.   Latex Rash   Penicillins Rash    Updated on 09/26/2020 following APP conversation with patient.  Swelling of the face/tongue/throat, SOB, or low BP? NO Sudden or severe rash/hives, skin peeling, or any reaction on the inside of your mouth or nose? NO - low severity rash to body.  Seek medical attention at a hospital or doctor's office? Yes When did it last happen? Age 64 following "a shot of PCN".       If all above answers are "NO", may proceed with cephalosporin use. - Has taken cefdinir and cephalexin without issues.     Review of Systems: Negative except as noted in the HPI  Family History: Family History  Problem Relation Age of Onset   Thyroid disease Mother    Osteoporosis Mother    Diabetes Father    Hyperlipidemia Father    Hypertension Father    Skin cancer Father    Heart attack Father    Sudden Cardiac Death Father    Obesity Father    Breast cancer Sister 15   Lung cancer Maternal Uncle    Lung cancer Maternal Uncle    Cancer Paternal Aunt        unk type   Brain cancer Paternal Uncle    Lung cancer Paternal Uncle    Ovarian cancer Maternal Grandmother        dx >50   Colon cancer  Maternal Grandmother        dx >50   Bladder Cancer Maternal Grandmother        dx >50   Breast cancer Other    Colon  cancer Half-Brother 68   Ovarian cancer Cousin        dx late 21s  Self-reported ancestry: *** Consanguinity: Denies Please see the genetic counselor note for additional information  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  Genetic counselor: Lambert Mody, MS, [facility]

## 2023-12-31 ENCOUNTER — Other Ambulatory Visit: Payer: Self-pay

## 2023-12-31 ENCOUNTER — Other Ambulatory Visit (HOSPITAL_COMMUNITY): Payer: Self-pay

## 2023-12-31 DIAGNOSIS — F4312 Post-traumatic stress disorder, chronic: Secondary | ICD-10-CM | POA: Diagnosis not present

## 2023-12-31 DIAGNOSIS — F5105 Insomnia due to other mental disorder: Secondary | ICD-10-CM | POA: Diagnosis not present

## 2023-12-31 DIAGNOSIS — F331 Major depressive disorder, recurrent, moderate: Secondary | ICD-10-CM | POA: Diagnosis not present

## 2023-12-31 DIAGNOSIS — F419 Anxiety disorder, unspecified: Secondary | ICD-10-CM | POA: Diagnosis not present

## 2023-12-31 MED ORDER — LEVOCETIRIZINE DIHYDROCHLORIDE 5 MG PO TABS
5.0000 mg | ORAL_TABLET | Freq: Every evening | ORAL | 11 refills | Status: AC
  Filled 2023-12-31: qty 30, 30d supply, fill #0
  Filled 2024-01-30: qty 90, 90d supply, fill #1
  Filled 2024-04-26: qty 90, 90d supply, fill #2
  Filled 2024-08-08: qty 90, 90d supply, fill #3

## 2023-12-31 MED ORDER — LAMOTRIGINE 25 MG PO TABS
50.0000 mg | ORAL_TABLET | Freq: Every evening | ORAL | 0 refills | Status: DC
  Filled 2023-12-31: qty 180, 90d supply, fill #0

## 2023-12-31 MED ORDER — LORAZEPAM 0.5 MG PO TABS
0.5000 mg | ORAL_TABLET | Freq: Every day | ORAL | 2 refills | Status: DC | PRN
  Filled 2023-12-31: qty 10, 10d supply, fill #0
  Filled 2024-03-30: qty 10, 10d supply, fill #1
  Filled 2024-06-08: qty 10, 10d supply, fill #2

## 2023-12-31 MED ORDER — HYDROXYZINE PAMOATE 25 MG PO CAPS
25.0000 mg | ORAL_CAPSULE | Freq: Two times a day (BID) | ORAL | 0 refills | Status: DC | PRN
  Filled 2023-12-31: qty 180, 90d supply, fill #0

## 2023-12-31 MED ORDER — LAMOTRIGINE 200 MG PO TABS
200.0000 mg | ORAL_TABLET | Freq: Every day | ORAL | 0 refills | Status: DC
  Filled 2023-12-31: qty 90, 90d supply, fill #0

## 2023-12-31 NOTE — Progress Notes (Signed)
 GENETIC COUNSELING NEW PATIENT EVALUATION Patient name: Heather Burnett DOB: 04-13-1971 Age: 53 y.o. MRN: [id number]  Referring Provider/Specialty: Twana First, MS, CGC  Date of Evaluation: 12/30/2023 Chief Complaint/Reason for Referral: Family history of early heart disease    Brief Summary: Heather Burnett is a 53 y.o. female who presents today for an initial genetics evaluation for a family history of early heart disease. She is unaccompanied at today's visit.  Prior genetic testing has been performed. The [facility] Expanded +RNA Panel was negative/normal.   Family History: See pedigree obtained during today's visit under History->Family->Pedigree.  The family history was notable for the following: Daughter, 74 yo, alive and well.  Paternal Family History Father, deceased at 45 yo, from a heart attack.  He had several heart attacks throughout his lifetime, with the first being in his 30s.  He required bypass and valve replacement later in life and also was found to have ischemic cardiomyopathy and aortic aneurysm. Uncle deceased from lung cancer. Uncle deceased from brain cancer with an unknown heart condition and hypertension. 2 uncles, deceased. Aunt, deceased. Her daughter, with ovarian and bladder cancer. 2 aunts, deceased, with heart attacks in the 40s and 50s, and atrial fibrillation. Aunt, deceased. Grandfather, deceased in his 17s, from a heart attack. Grandmother, deceased.  Maternal Family History Mother, 85 yo, alive and well. Half-sister, in her 20s, with breast cancer dx at 44 yo. Half-sister, in her 52s, alive and well. Half-brother, in his 29s with colon cancer dx at 22 yo. His daughter with pituitary adenoma. Half-brother, in his 33s, alive and well. 2 uncles, deceased from lung cancer. 1 other uncle and 3 aunts, deceased. Grandfather, deceased. Grandmother, deceased with colon and ovarian cancer.  Consangunity: Denies   Prior Genetic  testing: Heather Burnett was seen by Cancer Genetics for her family history of cancer.  The Heather Burnett CancerNext Expanded +RNA Panel was negative/normal.  Genetic Counseling: Heather Burnett is a 53 y.o. female with a family history of early heart disease and heart attack.  Heather Burnett reports that her father is deceased with multiple heart attacks beginning in his 30s. He required bypass and a valve replacement and was also identified to have ischemic cardiomyopathy and an aortic aneurysm. There is an additional history of heart attack in two paternal aunts, in their 63s and 31s, and her paternal grandfather, in his 62s.  There is an additional family history of cancer, which has previously been discussed and evaluated with a Scientist, research (life sciences).  All genetic testing for the family history of cancer was negative/normal.   Heather Burnett herself is generally healthy.  She has had a normal work-up from cardiology including an echocardiogram, CT cardiac, and cardiac PET scan.  The genetic etiology of various cardiac conditions was reviewed with Heather Burnett, including that we have ~20,000 genes that provide instructions to the body, some of which are important for the function of the heart. Each person possesses two copies of each gene, one inherited from each of their parents.  If there was an underlying genetic condition that caused her family history of early heart disease and heart attack, there is a chance that Heather Burnett may have inherited it from her father, making genetic testing reasonable, specifically gene panel testing. We reviewed that negative genetic testing for Heather Burnett does not guarantee  that her father and other relatives do not have a genetic condition as they have not been tested themselves.  We also discussed that  many heart conditions, including heart disease and heart attack, are likely multifactorial in nature, meaning that a combination of genetic and environmental factors, together,  cause symptoms rather than one single genetic condition.  If this is the case in Heather Burnett's family, we would expect her testing to be negative and a healthy lifestyle would be the most important factor in reducing her personal risk for heart disease and heart attack.  Heather Burnett was in agreement with this plan and expressed understanding.  Verbal consent was provided after discussion of the types of results, expected cost and timeline, and the Genetic Information Non-Discrimination Act (GINA).  A buccal sample was collected from Heather Burnett to be sent to [facility] for the Aortopathy Comprehensive Panel, Arrhythmia and Cardiomyopathy Panel, and Comprehensive Lipidemia Panel.  Results are expected in 2-3 weeks, at which point we will reach out with more information.  Recommendations:  [facility] Aortopathy Comprehensive Panel, Arrhythmia and Cardiomyopathy Panel, and Comprehensive Lipidemia Panel. Continue follow-up with other healthcare providers as recommended.  Date: 12/31/2023 Total time spent: 65 minutes Genetic Counselor-only time: 20 minutes  Time spent includes face to face and non-face to face care for the patient on the date of this encounter (history, genetic counseling, coordination of care, data gathering and/or documentation as outlined).   Lambert Mody MS [facility] Certified Genetic Counselor [facility] [employer]

## 2024-01-02 ENCOUNTER — Encounter: Payer: Self-pay | Admitting: "Endocrinology

## 2024-01-02 ENCOUNTER — Other Ambulatory Visit (HOSPITAL_COMMUNITY): Payer: Self-pay

## 2024-01-02 DIAGNOSIS — Z8249 Family history of ischemic heart disease and other diseases of the circulatory system: Secondary | ICD-10-CM | POA: Insufficient documentation

## 2024-01-04 ENCOUNTER — Encounter: Payer: Self-pay | Admitting: "Endocrinology

## 2024-01-04 ENCOUNTER — Other Ambulatory Visit (HOSPITAL_COMMUNITY): Payer: Self-pay

## 2024-01-04 ENCOUNTER — Telehealth: Payer: Self-pay

## 2024-01-04 ENCOUNTER — Other Ambulatory Visit: Payer: Self-pay

## 2024-01-04 DIAGNOSIS — E039 Hypothyroidism, unspecified: Secondary | ICD-10-CM

## 2024-01-04 MED ORDER — LEVOTHYROXINE SODIUM 50 MCG PO TABS
50.0000 ug | ORAL_TABLET | Freq: Every day | ORAL | 1 refills | Status: DC
  Filled 2024-01-04: qty 90, 90d supply, fill #0
  Filled 2024-03-30: qty 90, 90d supply, fill #1

## 2024-01-04 NOTE — Telephone Encounter (Signed)
 Rx refill for Levothyroxine po daily sent to pharmacy.

## 2024-01-04 NOTE — Telephone Encounter (Signed)
 Pt requesting Rx refill for levothyroxine. In her last office note it's noted her Levothyroxine is . In her med list her Levothyroxine is listed as . Please clarify dosing.

## 2024-01-05 ENCOUNTER — Other Ambulatory Visit: Payer: Self-pay

## 2024-01-05 DIAGNOSIS — E119 Type 2 diabetes mellitus without complications: Secondary | ICD-10-CM

## 2024-01-05 MED ORDER — DEXCOM G7 SENSOR MISC
1 refills | Status: AC
  Filled 2024-01-05 – 2024-01-06 (×2): qty 9, 90d supply, fill #0
  Filled 2024-01-20: qty 3, 30d supply, fill #0
  Filled 2024-09-29: qty 3, 30d supply, fill #1

## 2024-01-06 ENCOUNTER — Other Ambulatory Visit (HOSPITAL_COMMUNITY): Payer: Self-pay

## 2024-01-06 ENCOUNTER — Other Ambulatory Visit: Payer: Self-pay

## 2024-01-11 ENCOUNTER — Other Ambulatory Visit (HOSPITAL_COMMUNITY): Payer: Self-pay

## 2024-01-13 ENCOUNTER — Telehealth: Payer: Self-pay | Admitting: Genetic Counselor

## 2024-01-13 NOTE — Progress Notes (Signed)
 Attempted to call Ms. Littlefield. Left voicemail requesting a callback to discuss results of genetic testing.  Tilda Franco, MS [facility] Certified Genetic Counselor

## 2024-01-18 ENCOUNTER — Encounter: Payer: Self-pay | Admitting: "Endocrinology

## 2024-01-18 ENCOUNTER — Other Ambulatory Visit (HOSPITAL_COMMUNITY): Payer: Self-pay

## 2024-01-18 NOTE — Progress Notes (Signed)
  Attempted to call Ms. Blasko for the second time. Left voicemail requesting a callback to discuss results of genetic testing.   Tilda Franco, MS [facility] Certified Genetic Counselor

## 2024-01-20 ENCOUNTER — Other Ambulatory Visit: Payer: Self-pay

## 2024-01-20 ENCOUNTER — Other Ambulatory Visit (HOSPITAL_COMMUNITY): Payer: Self-pay

## 2024-01-21 DIAGNOSIS — E782 Mixed hyperlipidemia: Secondary | ICD-10-CM | POA: Diagnosis not present

## 2024-01-22 LAB — TSH: TSH: 1.01 u[IU]/mL (ref 0.450–4.500)

## 2024-01-22 LAB — T4, FREE: Free T4: 1.1 ng/dL (ref 0.82–1.77)

## 2024-01-24 NOTE — Progress Notes (Signed)
 Spoke with Ms. Heather Burnett regarding the results of her recent genetic testing.   Heather Burnett was seen in the [facility] clinic on 12/30/2023 at 53 yo due to a family history of cardiac conditions.  After evaluation, genetic testing was ordered for Heather Burnett including the several gene panels.   The [facility] Arrhythmia and Cardiomyopathy Comprehensive Panel was negative/normal.  No changes to medical management are recommended based on these results.   The [facility] Aortopathy Comprehensive Panel was negative/normal.  No changes to medical management are recommended based on these results.   The [facility] Comprehensive Lipidemia Panel was negative/normal.  No changes to medical management are recommended based on these results.   We discussed that it is possible that other individuals in Heather Burnett's family may have a genetic condition that was not inherited by Heather Burnett.  It is also possible that the etiology of the cardiac symptoms in Heather Burnett's family is multifactorial, meaning that a combination of genetic environmental factors lead to symptoms rather than one specific genetic condition.   Heather Burnett expressed understanding of these results and was encouraged to reach out with any further questions.  The test report has been released to the family and is attached to the associated order.   Tilda Franco, MS [facility]  Certified Genetic Counselor

## 2024-01-25 ENCOUNTER — Encounter: Payer: Self-pay | Admitting: "Endocrinology

## 2024-01-25 ENCOUNTER — Ambulatory Visit: Payer: 59 | Admitting: "Endocrinology

## 2024-01-25 VITALS — BP 108/74 | HR 76 | Ht 62.0 in | Wt 236.8 lb

## 2024-01-25 DIAGNOSIS — Z7984 Long term (current) use of oral hypoglycemic drugs: Secondary | ICD-10-CM | POA: Diagnosis not present

## 2024-01-25 DIAGNOSIS — E782 Mixed hyperlipidemia: Secondary | ICD-10-CM

## 2024-01-25 DIAGNOSIS — E119 Type 2 diabetes mellitus without complications: Secondary | ICD-10-CM | POA: Diagnosis not present

## 2024-01-25 DIAGNOSIS — E66812 Obesity, class 2: Secondary | ICD-10-CM

## 2024-01-25 DIAGNOSIS — Z6839 Body mass index (BMI) 39.0-39.9, adult: Secondary | ICD-10-CM

## 2024-01-25 DIAGNOSIS — E039 Hypothyroidism, unspecified: Secondary | ICD-10-CM

## 2024-01-25 LAB — POCT GLYCOSYLATED HEMOGLOBIN (HGB A1C): HbA1c, POC (controlled diabetic range): 6.6 % (ref 0.0–7.0)

## 2024-01-25 NOTE — Progress Notes (Signed)
 01/25/2024, 6:00 PM   Endocrinology follow-up note  Subjective:    Patient ID: Heather Burnett, female    DOB: 1971-01-16, PCP Lynnea Ferrier, MD   Past Medical History:  Diagnosis Date   Achilles tendinitis of left lower extremity    Allergy    Anxiety    Asthma    WELL CONTROLLED   Back pain    Cancer (HCC)    melanoma on my chest   Chest pain    Constipation    COPD (chronic obstructive pulmonary disease) (HCC)    not sure about COPD    DDD (degenerative disc disease), cervical    and lower back   Depression    Diabetes mellitus, type II (HCC)    Diverticulitis    DJD (degenerative joint disease)    Dysrhythmia    Empty sella syndrome (HCC)    followed at [facility]   Empty sella syndrome [facility])    Family history of adverse reaction to anesthesia    mother almost died with anesthesia in the past, hard to wake up and got real sick on my stomach vomiting, now she receives local anethesia.   GERD (gastroesophageal reflux disease)    Hallux limitus of left foot    Headache    history of migraine   Heart murmur    Hiatal hernia    History of methicillin resistant staphylococcus aureus (MRSA)    History of palpitations 07/2004   Hyperlipidemia    Hypertension    Hypothyroidism    Hypothyroidism, adult 02/08/2020   Idiopathic peripheral neuropathy    Joint pain    Lactose intolerance    Lymphocytosis    Murmur    Neuropathy 2016   Osteoporosis    Palpitations    PCOS (polycystic ovarian syndrome)    Pituitary microadenoma (HCC)    followed at [facility]   Pneumonia 2008   Pre-diabetes    Sleep apnea    USES CPAP   Swallowing difficulty    Tachycardia    Thyroid disease    Vitamin D deficiency    Von Willebrand disease (HCC) 2012   PT STATES SHE HAS NEVER HAD ANY ISSUES WITH BLEEDING   Past Surgical History:  Procedure Laterality Date   ACHILLES TENDON SURGERY Left 12/15/2019   Procedure: ACHILLES TENDON  REPAIR PRIMARY;  Surgeon: Gwyneth Revels, DPM;  Location: [facility] ORS;  Service: Podiatry;  Laterality: Left;   ACHILLES TENDON SURGERY Left 07/30/2023   Procedure: 16109 ACHILLES REPAIR SECONDARY;  Surgeon: Gwyneth Revels, DPM;  Location: [facility] ORS;  Service: Orthopedics/Podiatry;  Laterality: Left;   ANKLE ARTHROSCOPY Left 07/30/2023   Procedure: ANKLE ARTHROSCOPY - OSTEOCHONDRITIS DISSECANS REPAIR;  Surgeon: Gwyneth Revels, DPM;  Location: [facility] ORS;  Service: Orthopedics/Podiatry;  Laterality: Left;   ANTERIOR CERVICAL DECOMP/DISCECTOMY FUSION N/A 10/02/2020   Procedure: ANTERIOR CERVICAL DECOMPRESSION/DISCECTOMY FUSION 1 LEVEL C6-7;  Surgeon: Venetia Night, MD;  Location: [facility] ORS;  Service: Neurosurgery;  Laterality: N/A;   ARTHRODESIS METATARSALPHALANGEAL JOINT (MTPJ) Left 07/30/2023   Procedure: ARTHRODESIS METATARSALPHALANGEAL JOINT (MTPJ) FIRST;  Surgeon: Gwyneth Revels, DPM;  Location: [facility] OR[facility];  Service: Orthopedics/Podiatry;  Laterality: Left;   BACK SURGERY     removal of 2 lumbar  vertebrae   BREAST BIOPSY Right 2010   NEG   CHOLECYSTECTOMY     COLONOSCOPY  03/03/2013, 02/06/2008   COLONOSCOPY N/A 08/26/2022   Procedure: COLONOSCOPY;  Surgeon: Toledo, Boykin Nearing, MD;  Location: [facility] ENDOSCOPY;  Service: Gastroenterology;  Laterality: N/A;   COLONOSCOPY WITH PROPOFOL N/A 08/05/2018   Procedure: COLONOSCOPY WITH PROPOFOL;  Surgeon: Scot Jun, MD;  Location: [facility] ENDOSCOPY;  Service: Endoscopy;  Laterality: N/A;   EGD  02/06/2008   ESOPHAGOGASTRODUODENOSCOPY N/A 06/10/2022   Procedure: ESOPHAGOGASTRODUODENOSCOPY (EGD);  Surgeon: Toledo, Boykin Nearing, MD;  Location: [facility] ENDOSCOPY;  Service: Gastroenterology;  Laterality: N/A;   GASTROC RECESSION EXTREMITY Left 12/15/2019   Procedure: GASTROC RECESSION EXTREMITY;  Surgeon: Gwyneth Revels, DPM;  Location: [facility];  Service: Podiatry;  Laterality: Left;   HALLUX VALGUS CHEILECTOMY Left 08/21/2016   Procedure: HALLUX VALGUS  CHEILECTOMY;  Surgeon: Linus Galas, DPM;  Location: [facility];  Service: Podiatry;  Laterality: Left;   HEMORRHOID SURGERY  10/2014   SPINE SURGERY     TENDON REPAIR Left 07/30/2023   Procedure: 81191 FLEXOR TENDON REPAIR  SECOND;  Surgeon: Gwyneth Revels, DPM;  Location: [facility] OR[facility];  Service: Orthopedics/Podiatry;  Laterality: Left;   TONSILLECTOMY AND ADENOIDECTOMY     Social History   Socioeconomic History   Marital status: Divorced    Spouse name: Not on file   Number of children: Not on file   Years of education: Not on file   Highest education level: Not on file  Occupational History   Occupation: CMA [employer]  Tobacco Use   Smoking status: Former    Current packs/day: 0.00    Types: Cigarettes    Start date: 04/09/1986    Quit date: 04/09/1994    Years since quitting: 29.8   Smokeless tobacco: Never  Vaping Use   Vaping status: Never Used  Substance and Sexual Activity   Alcohol use: Yes    Comment: social   Drug use: No   Sexual activity: Not on file  Other Topics Concern   Not on file  Social History Narrative   Divorced since 2009.Lives with 51 year old mum.CMA at [employer].   Social Drivers of Corporate investment banker Strain: Low Risk  (10/19/2023)   Received from [facility]   Overall Financial Resource Strain (CARDIA)    Difficulty of Paying Living Expenses: Not hard at all  Food Insecurity: No Food Insecurity (10/19/2023)   [facility]   Hunger Vital Sign    Worried About Running Out of Food in the Last Year: Never true    Ran Out of Food in the Last Year: Never true  Transportation Needs: No Transportation Needs (10/19/2023)   Received from [facility] - Transportation    In the past 12 months, has lack of transportation kept you from medical appointments or from getting medications?: No    Lack of Transportation (Non-Medical): No  Physical Activity: Not on file  Stress: Not  on file  Social Connections: Not on file   Family History  Problem Relation Age of Onset   Thyroid disease Mother    Osteoporosis Mother    Diabetes Father    Hyperlipidemia Father    Hypertension Father    Skin cancer Father    Heart attack Father    Sudden Cardiac Death Father    Obesity Father    Breast cancer Sister 76   Lung cancer Maternal Uncle  Lung cancer Maternal Uncle    Cancer Paternal Aunt        unk type   Brain cancer Paternal Uncle    Lung cancer Paternal Uncle    Ovarian cancer Maternal Grandmother        dx >50   Colon cancer Maternal Grandmother        dx >50   Bladder Cancer Maternal Grandmother        dx >50   Breast cancer Other    Colon cancer Half-Brother 18   Ovarian cancer Cousin        dx late 90s   Outpatient Encounter Medications as of 01/25/2024  Medication Sig   acetaminophen (TYLENOL) 500 MG tablet Take 500 mg by mouth every 6 (six) hours as needed for mild pain or headache.    albuterol (VENTOLIN HFA) 108 (90 Base) MCG/ACT inhaler Inhale 2 inhalations into the lungs every 6 (six) hours as needed for Wheezing   atorvastatin (LIPITOR) 40 MG tablet Take 1 tablet (40 mg total) by mouth once daily   cetirizine (ZYRTEC) 10 MG tablet Take 10 mg by mouth daily as needed (seasonal allergies.).    Cholecalciferol (VITAMIN D-3) 125 MCG (5000 UT) TABS Take 2 tablets by mouth daily.   clonazePAM (KLONOPIN) 0.5 MG tablet Take 0.5 mg by mouth daily as needed for anxiety.   Continuous Blood Gluc Receiver (DEXCOM G7 RECEIVER) DEVI Use to test Blood glucose 4 times daily   Continuous Glucose Sensor (DEXCOM G7 SENSOR) MISC Change sensor every 10 days   cyclobenzaprine (FLEXERIL) 5 MG tablet Take 1-2 tablets (5-10 mg total) by mouth 3 (three) times daily as needed for muscle pain.   cycloSPORINE (RESTASIS) 0.05 % ophthalmic emulsion Place 1 drop into both eyes 2 (two) times daily.   empagliflozin (JARDIANCE) 25 MG TABS tablet Take 1 tablet (25 mg) by mouth  daily before breakfast.   fluconazole (DIFLUCAN) 150 MG tablet Take 1 tablet by mouth now, may repeat dose in 1 week   fluticasone (FLOVENT HFA) 110 MCG/ACT inhaler Inhale 1 puff by mouth 2 (two) times daily   furosemide (LASIX) 40 MG tablet Take 1 tablet (40 mg total) by mouth dailyas needed for edema.   hydrocortisone (ANUSOL-HC) 2.5 % rectal cream Apply 1 Application to the affected area 3 (three) times daily.   hydrOXYzine (VISTARIL) 25 MG capsule Take 1 capsule (25 mg total) by mouth 2 (two) times daily as needed.   hydrOXYzine (VISTARIL) 25 MG capsule Take 1 capsule (25 mg total) by mouth 2 (two) times daily as needed.   Lactase (LACTAID PO) Take by mouth.   lamoTRIgine (LAMICTAL) 200 MG tablet Take 1 tablet (200 mg total) by mouth daily. (Patient taking differently: Take 150 mg by mouth daily.)   lamoTRIgine (LAMICTAL) 200 MG tablet Take 1 tablet (200 mg total) by mouth daily.   lamoTRIgine (LAMICTAL) 200 MG tablet Take 1 tablet (200 mg total) by mouth daily.   lamoTRIgine (LAMICTAL) 25 MG tablet Take 2 tablets (50 mg total) by mouth Nightly.   lamoTRIgine (LAMICTAL) 25 MG tablet Take 2 tablets (50 mg total) by mouth Nightly.   lamoTRIgine (LAMICTAL) 25 MG tablet Take 2 tablets (50 mg total) by mouth at bedtime.   levalbuterol (XOPENEX HFA) 45 MCG/ACT inhaler Inhale 2 puffs into the lungs every 6 (six) hours as needed for wheezing   levocetirizine (XYZAL) 5 MG tablet Take 1 tablet (5 mg total) by mouth every evening.   levothyroxine (SYNTHROID) 50 MCG  tablet Take 1 tablet (50 mcg total) by mouth daily before breakfast.   LORazepam (ATIVAN) 0.5 MG tablet Take 1 tablet (0.5 mg total) by mouth daily as needed.   LORazepam (ATIVAN) 0.5 MG tablet Take 1 tablet (0.5 mg total) by mouth daily as needed.   LORazepam (ATIVAN) 0.5 MG tablet Take 1 tablet (0.5 mg total) by mouth daily as needed.   LORazepam (ATIVAN) 0.5 MG tablet Take 1 tablet (0.5 mg total) by mouth daily as needed.   metFORMIN  (GLUCOPHAGE-XR) 500 MG 24 hr tablet Take 2 tablets (1,000 mg total) by mouth daily with breakfast.   metoprolol tartrate (LOPRESSOR) 25 MG tablet Take 1 tablet (25 mg total) by mouth daily as needed.   metroNIDAZOLE (METROCREAM) 0.75 % cream Apply cream topically to face twice daily   nitroGLYCERIN (NITROSTAT) 0.4 MG SL tablet Place 1 tablet under the tongue every 5 minutes as needed for chest pain (up to 3 tablets total). If no relief after 3 doses, call 911.   ondansetron (ZOFRAN-ODT) 4 MG disintegrating tablet Dissolve 1 tablet (4 mg total) by mouth every 8 (eight) hours as needed for Nausea for up to 7 days   pantoprazole (PROTONIX) 40 MG tablet Take 1 tablet (40 mg total) by mouth 2 (two) times daily 30 mins before breakfast and 30 mins before dinner.   progesterone (PROMETRIUM) 200 MG capsule Take 2 capsules (400 mg total) by mouth at bedtime.   spironolactone (ALDACTONE) 50 MG tablet Take 1 tablet (50 mg total) by mouth daily.   [DISCONTINUED] metoCLOPramide (REGLAN) 10 MG tablet Take 1 tablet (10 mg total) by mouth 3 (three) times daily as needed   No facility-administered encounter medications on file as of 01/25/2024.   ALLERGIES: Allergies  Allergen Reactions   Liraglutide -Weight Management Other (See Comments)    GI upset   Polysorbate    Sorbitan Other (See Comments)    GI upset   Adhesive [Tape] Itching and Rash    Paper tape or coban is easier on the skin.   Latex Rash   Penicillins Rash    Updated on 09/26/2020 following APP conversation with patient.  Swelling of the face/tongue/throat, SOB, or low BP? NO Sudden or severe rash/hives, skin peeling, or any reaction on the inside of your mouth or nose? NO - low severity rash to body.  Seek medical attention at a hospital or doctor's office? Yes When did it last happen? Age 58 following "a shot of PCN".       If all above answers are "NO", may proceed with cephalosporin use. - Has taken cefdinir and cephalexin without issues.       VACCINATION STATUS: Immunization History  Administered Date(s) Administered   Influenza Split 08/02/2015, 07/19/2018   Influenza,inj,Quad PF,6+ Mos 07/22/2017   Influenza-Unspecified 06/19/2020   Tdap 11/12/2010, 04/08/2021    HPI SACHA TOPOR is 53 y.o. female who presents today for follow-up after she was seen in consultation for hypothyroidism, type 2 diabetes.   See notes from previous visit.  She was diagnosed with hypothyroidism at approximate age of 44 years.   She was treated with various doses of thyroid hormone over the years.  Due to problems stabilizing her thyroid function tests she was switched from NP thyroid to levothyroxine several visits ago.   -She is currently on levothyroxine 50 mcg p.o. daily before breakfast.  Her previsit thyroid function tests are consistent with appropriate replacement.   She reports good consistency.  She denies any  exposure to thyroid iodine ablation nor thyroidectomy.   she denies dysphagia, shortness of breath nor voice change.  She has family history of various types of thyroid dysfunction, however no thyroid malignancy.  Her recent thyroid ultrasound was unremarkable.  She also has type 2 diabetes currently on metformin 1000 mg p.o. daily and Jardiance 25 mg p.o. once a day.  She presents with her Dexcom CGM showing 100% time range.  No hypoglycemia.  Her point-of-care A1c is 6.6%.        Review of Systems  Constitutional:  + Fluctuating body weight, + fatigue, + subjective hyperthermia, no subjective hypothermia   Objective:       01/25/2024    3:54 PM 12/30/2023    2:46 PM 09/22/2023    3:50 PM  Vitals with BMI  Height 5\' 2"   5\' 2"   Weight 236 lbs 13 oz 229 lbs 13 oz 229 lbs 13 oz  BMI 43.3  42.02  Systolic 108 120 098  Diastolic 74 67 68  Pulse 76 83 76    BP 108/74   Pulse 76   Ht 5\' 2"  (1.575 m)   Wt 236 lb 12.8 oz (107.4 kg)   BMI 43.31 kg/m   Wt Readings from Last 3 Encounters:  01/25/24 236 lb 12.8 oz  (107.4 kg)  12/30/23 229 lb 12.8 oz (104.2 kg)  09/22/23 229 lb 12.8 oz (104.2 kg)    Physical Exam  Constitutional:  Body mass index is 43.31 kg/m.,  not in acute distress, normal state of mind Eyes: PERRLA, EOMI, no exophthalmos ENT: moist mucous membranes, no gross thyromegaly, no gross cervical lymphadenopathy   CMP ( most recent) CMP     Component Value Date/Time   NA 137 07/23/2023 1158   NA 140 02/10/2022 0847   K 4.1 07/23/2023 1158   CL 102 07/23/2023 1158   CO2 25 07/23/2023 1158   GLUCOSE 105 (H) 07/23/2023 1158   BUN 19 07/23/2023 1158   BUN 9 02/10/2022 0847   CREATININE 0.77 07/23/2023 1158   CREATININE 0.79 04/08/2021 1659   CALCIUM 9.5 07/23/2023 1158   PROT 6.9 04/29/2023 0730   PROT 6.6 02/10/2022 0847   ALBUMIN 3.9 04/29/2023 0730   ALBUMIN 4.3 02/10/2022 0847   AST 18 04/29/2023 0730   ALT 16 04/29/2023 0730   ALKPHOS 59 04/29/2023 0730   BILITOT 0.5 04/29/2023 0730   BILITOT 0.4 02/10/2022 0847   GFRNONAA >60 07/23/2023 1158   GFRNONAA 89 04/08/2021 1659   GFRAA 103 04/08/2021 1659     Diabetic Labs (most recent): Lab Results  Component Value Date   HGBA1C 6.6 01/25/2024   HGBA1C 7.2 (H) 04/29/2023   HGBA1C 8.0 (A) 02/04/2023   MICROALBUR <0.2 01/06/2021   MICROALBUR 20.3 (H) 07/11/2015     Lipid Panel ( most recent) Lipid Panel     Component Value Date/Time   CHOL 141 04/29/2023 0730   CHOL 147 02/10/2022 0847   TRIG 213 (H) 04/29/2023 0730   HDL 32 (L) 04/29/2023 0730   HDL 46 02/10/2022 0847   CHOLHDL 4.4 04/29/2023 0730   VLDL 43 (H) 04/29/2023 0730   LDLCALC 66 04/29/2023 0730   LDLCALC 71 02/10/2022 0847   LDLCALC 95 04/08/2021 1659   LABVLDL 30 02/10/2022 0847      Lab Results  Component Value Date   TSH 1.010 01/21/2024   TSH 1.068 04/29/2023   TSH 0.796 11/03/2022   TSH 1.155 08/03/2022   TSH 0.073 (L) 05/01/2022  TSH 0.310 (L) 02/10/2022   TSH 0.741 10/22/2021   TSH <0.01 (L) 04/08/2021   TSH 1.137  07/21/2018   TSH 0.944 07/11/2015   FREET4 1.10 01/21/2024   FREET4 0.87 04/29/2023   FREET4 0.74 11/03/2022   FREET4 0.62 08/03/2022   FREET4 0.86 05/01/2022   FREET4 1.37 02/10/2022   FREET4 0.83 10/22/2021      Thyroid ultrasound October 24, 2021  There is a small subcentimeter anechoic cystic nodule in the right mid thyroid lobe. This nodule does NOT meet TI-RADS criteria for biopsy or dedicated follow-up.   IMPRESSION: Normal size and sonographic appearance of the thyroid gland.   Assessment & Plan:   1. Hypothyroidism 2.  Type 2 diabetes 3.  Hyperlipidemia 4.  Morbid obesity  Her previsit thyroid function tests are consistent with appropriate replacement.  She is advised to continue levothyroxine 50 mcg p.o. daily before breakfast.    - We discussed about the correct intake of her thyroid hormone, on empty stomach at fasting, with water, separated by at least 30 minutes from breakfast and other medications,  and separated by more than 4 hours from calcium, iron, multivitamins, acid reflux medications (PPIs). -Patient is made aware of the fact that thyroid hormone replacement is needed for life, dose to be adjusted by periodic monitoring of thyroid function tests.   Her baseline thyroid ultrasound is unremarkable. Regarding her metabolic dysfunction including type 2 diabetes hyperlipidemia, hypertension, morbid obesity, she remains a good candidate for lifestyle medicine.  - she acknowledges that there is a room for improvement in her food and drink choices. - Suggestion is made for her to avoid simple carbohydrates  from her diet including Cakes, Sweet Desserts, Ice Cream, Soda (diet and regular), Sweet Tea, Candies, Chips, Cookies, Store Bought Juices, Alcohol , Artificial Sweeteners,  Coffee Creamer, and "Sugar-free" Products, Lemonade. This will help patient to have more stable blood glucose profile and potentially avoid unintended weight gain.  The following Lifestyle  Medicine recommendations according to American College of Lifestyle Medicine  [facility]) were discussed and and offered to patient and she  agrees to start the journey:  A. Whole Foods, Plant-Based Nutrition comprising of fruits and vegetables, plant-based proteins, whole-grain carbohydrates was discussed in detail with the patient.   A list for source of those nutrients were also provided to the patient.  Patient will use only water or unsweetened tea for hydration. B.  The need to stay away from risky substances including alcohol, smoking; obtaining 7 to 9 hours of restorative sleep, at least 150 minutes of moderate intensity exercise weekly, the importance of healthy social connections,  and stress management techniques were discussed. C.  A full color page of  Calorie density of various food groups per pound showing examples of each food groups was provided to the patient.  She is responding to her current medication regimen.  She is advised to continue  metformin 1000 mg p.o. daily at breakfast, Jardiance 25 mg p.o. daily at breakfast as well.    -In light of her polypharmacy, even with partial engagement, she would benefit the most from lifestyle medicine.  See above. She is advised to continue atorvastatin 40 mg p.o. nightly for hyperlipidemia.    Side effects and precautions discussed with her.  She is advised to maintain close follow-up with her PMD.  I spent  26  minutes in the care of the patient today including review of labs from CMP, Lipids, Thyroid Function, Hematology (current and previous including  abstractions from other facilities); face-to-face time discussing  her blood glucose readings/logs, discussing hypoglycemia and hyperglycemia episodes and symptoms, medications doses, her options of short and long term treatment based on the latest standards of care / guidelines;  discussion about incorporating lifestyle medicine;  and documenting the encounter. Risk reduction counseling  performed per USPSTF guidelines to reduce  obesity and cardiovascular risk factors.     Please refer to Patient Instructions for Blood Glucose Monitoring and Insulin/Medications Dosing Guide"  in media tab for additional information. Please  also refer to " Patient Self Inventory" in the Media  tab for reviewed elements of pertinent patient history.  Elvina Mattes participated in the discussions, expressed understanding, and voiced agreement with the above plans.  All questions were answered to her satisfaction. she is encouraged to contact clinic should she have any questions or concerns prior to her return visit.     Follow up plan: Return in about 6 months (around 07/26/2024) for F/U with Pre-visit Labs, Meter/CGM/Logs, A1c here.   Marquis Lunch, MD [facility] [address] [place], [place] [address] Phone: [phone]  Fax: [phone]     01/25/2024, 6:00 PM  This note was partially dictated with voice recognition software. Similar sounding words can be transcribed inadequately or may not  be corrected upon review.

## 2024-01-25 NOTE — Patient Instructions (Signed)

## 2024-01-31 ENCOUNTER — Other Ambulatory Visit: Payer: Self-pay

## 2024-01-31 ENCOUNTER — Other Ambulatory Visit (HOSPITAL_COMMUNITY): Payer: Self-pay

## 2024-02-05 ENCOUNTER — Other Ambulatory Visit (HOSPITAL_COMMUNITY): Payer: Self-pay

## 2024-02-24 ENCOUNTER — Other Ambulatory Visit (HOSPITAL_COMMUNITY): Payer: Self-pay

## 2024-02-24 ENCOUNTER — Other Ambulatory Visit: Payer: Self-pay

## 2024-02-24 ENCOUNTER — Encounter: Payer: Self-pay | Admitting: Pharmacist

## 2024-02-24 DIAGNOSIS — E118 Type 2 diabetes mellitus with unspecified complications: Secondary | ICD-10-CM | POA: Diagnosis not present

## 2024-02-24 DIAGNOSIS — E7849 Other hyperlipidemia: Secondary | ICD-10-CM | POA: Diagnosis not present

## 2024-02-24 DIAGNOSIS — D6800 Von Willebrand disease, unspecified: Secondary | ICD-10-CM | POA: Diagnosis not present

## 2024-02-24 DIAGNOSIS — E559 Vitamin D deficiency, unspecified: Secondary | ICD-10-CM | POA: Diagnosis not present

## 2024-02-24 MED ORDER — NITROFURANTOIN MONOHYD MACRO 100 MG PO CAPS
100.0000 mg | ORAL_CAPSULE | Freq: Two times a day (BID) | ORAL | 0 refills | Status: AC
Start: 2024-02-24 — End: 2024-02-29
  Filled 2024-02-24: qty 10, 5d supply, fill #0

## 2024-02-28 ENCOUNTER — Other Ambulatory Visit (HOSPITAL_COMMUNITY): Payer: Self-pay

## 2024-02-28 ENCOUNTER — Other Ambulatory Visit: Payer: Self-pay

## 2024-02-28 ENCOUNTER — Other Ambulatory Visit: Payer: Self-pay | Admitting: "Endocrinology

## 2024-03-01 ENCOUNTER — Other Ambulatory Visit (HOSPITAL_COMMUNITY): Payer: Self-pay

## 2024-03-01 ENCOUNTER — Other Ambulatory Visit: Payer: Self-pay

## 2024-03-01 MED ORDER — CYCLOBENZAPRINE HCL 5 MG PO TABS
5.0000 mg | ORAL_TABLET | Freq: Three times a day (TID) | ORAL | 5 refills | Status: AC | PRN
Start: 2024-02-29 — End: ?
  Filled 2024-03-01: qty 120, 20d supply, fill #0
  Filled 2024-06-08: qty 120, 20d supply, fill #1
  Filled 2024-08-08: qty 120, 20d supply, fill #2

## 2024-03-02 ENCOUNTER — Other Ambulatory Visit: Payer: Self-pay

## 2024-03-02 ENCOUNTER — Other Ambulatory Visit (HOSPITAL_COMMUNITY): Payer: Self-pay

## 2024-03-02 ENCOUNTER — Other Ambulatory Visit (HOSPITAL_BASED_OUTPATIENT_CLINIC_OR_DEPARTMENT_OTHER): Payer: Self-pay

## 2024-03-02 MED ORDER — METFORMIN HCL ER 500 MG PO TB24
1000.0000 mg | ORAL_TABLET | Freq: Every day | ORAL | 0 refills | Status: DC
  Filled 2024-03-02: qty 180, 90d supply, fill #0

## 2024-03-02 MED ORDER — EMPAGLIFLOZIN 25 MG PO TABS
25.0000 mg | ORAL_TABLET | Freq: Every day | ORAL | 0 refills | Status: DC
  Filled 2024-03-02: qty 90, 90d supply, fill #0

## 2024-03-03 DIAGNOSIS — R3129 Other microscopic hematuria: Secondary | ICD-10-CM | POA: Diagnosis not present

## 2024-03-03 DIAGNOSIS — E236 Other disorders of pituitary gland: Secondary | ICD-10-CM | POA: Diagnosis not present

## 2024-03-03 DIAGNOSIS — Z6841 Body Mass Index (BMI) 40.0 and over, adult: Secondary | ICD-10-CM | POA: Diagnosis not present

## 2024-03-03 DIAGNOSIS — G4733 Obstructive sleep apnea (adult) (pediatric): Secondary | ICD-10-CM | POA: Diagnosis not present

## 2024-03-03 DIAGNOSIS — I10 Essential (primary) hypertension: Secondary | ICD-10-CM | POA: Diagnosis not present

## 2024-03-03 DIAGNOSIS — K5904 Chronic idiopathic constipation: Secondary | ICD-10-CM | POA: Diagnosis not present

## 2024-03-03 DIAGNOSIS — E118 Type 2 diabetes mellitus with unspecified complications: Secondary | ICD-10-CM | POA: Diagnosis not present

## 2024-03-03 DIAGNOSIS — D6800 Von Willebrand disease, unspecified: Secondary | ICD-10-CM | POA: Diagnosis not present

## 2024-03-03 DIAGNOSIS — D352 Benign neoplasm of pituitary gland: Secondary | ICD-10-CM | POA: Diagnosis not present

## 2024-03-03 DIAGNOSIS — G609 Hereditary and idiopathic neuropathy, unspecified: Secondary | ICD-10-CM | POA: Diagnosis not present

## 2024-03-06 DIAGNOSIS — G4733 Obstructive sleep apnea (adult) (pediatric): Secondary | ICD-10-CM | POA: Diagnosis not present

## 2024-03-16 ENCOUNTER — Other Ambulatory Visit (HOSPITAL_COMMUNITY): Payer: Self-pay

## 2024-03-17 ENCOUNTER — Other Ambulatory Visit (HOSPITAL_COMMUNITY): Payer: Self-pay

## 2024-03-17 ENCOUNTER — Other Ambulatory Visit: Payer: Self-pay

## 2024-03-17 MED ORDER — HYDROCORTISONE 2.5 % EX CREA
TOPICAL_CREAM | Freq: Two times a day (BID) | CUTANEOUS | 0 refills | Status: AC
  Filled 2024-03-17: qty 30, 30d supply, fill #0

## 2024-03-17 MED ORDER — FLUCONAZOLE 150 MG PO TABS
150.0000 mg | ORAL_TABLET | ORAL | 1 refills | Status: DC
  Filled 2024-03-17: qty 2, 7d supply, fill #0
  Filled 2024-03-30: qty 2, 7d supply, fill #1

## 2024-03-18 ENCOUNTER — Other Ambulatory Visit (HOSPITAL_BASED_OUTPATIENT_CLINIC_OR_DEPARTMENT_OTHER): Payer: Self-pay

## 2024-03-30 ENCOUNTER — Other Ambulatory Visit: Payer: Self-pay | Admitting: "Endocrinology

## 2024-03-30 ENCOUNTER — Other Ambulatory Visit: Payer: Self-pay

## 2024-03-30 ENCOUNTER — Other Ambulatory Visit (HOSPITAL_COMMUNITY): Payer: Self-pay

## 2024-03-31 ENCOUNTER — Other Ambulatory Visit: Payer: Self-pay

## 2024-03-31 ENCOUNTER — Other Ambulatory Visit (HOSPITAL_COMMUNITY): Payer: Self-pay

## 2024-03-31 DIAGNOSIS — F331 Major depressive disorder, recurrent, moderate: Secondary | ICD-10-CM | POA: Diagnosis not present

## 2024-03-31 DIAGNOSIS — F4312 Post-traumatic stress disorder, chronic: Secondary | ICD-10-CM | POA: Diagnosis not present

## 2024-03-31 DIAGNOSIS — F5105 Insomnia due to other mental disorder: Secondary | ICD-10-CM | POA: Diagnosis not present

## 2024-03-31 DIAGNOSIS — F419 Anxiety disorder, unspecified: Secondary | ICD-10-CM | POA: Diagnosis not present

## 2024-03-31 MED ORDER — EMPAGLIFLOZIN 25 MG PO TABS
25.0000 mg | ORAL_TABLET | Freq: Every day | ORAL | 0 refills | Status: DC
  Filled 2024-03-31 – 2024-06-08 (×3): qty 90, 90d supply, fill #0

## 2024-03-31 MED ORDER — LORAZEPAM 0.5 MG PO TABS
0.5000 mg | ORAL_TABLET | Freq: Every day | ORAL | 2 refills | Status: DC | PRN
  Filled 2024-03-31: qty 10, 10d supply, fill #0

## 2024-03-31 MED ORDER — METFORMIN HCL ER 500 MG PO TB24
1000.0000 mg | ORAL_TABLET | Freq: Every day | ORAL | 0 refills | Status: DC
  Filled 2024-03-31 – 2024-06-08 (×2): qty 180, 90d supply, fill #0

## 2024-03-31 MED ORDER — LAMOTRIGINE 200 MG PO TABS
200.0000 mg | ORAL_TABLET | Freq: Every day | ORAL | 0 refills | Status: DC
  Filled 2024-03-31: qty 90, 90d supply, fill #0

## 2024-03-31 MED ORDER — LAMOTRIGINE 25 MG PO TABS
50.0000 mg | ORAL_TABLET | Freq: Every evening | ORAL | 0 refills | Status: DC
  Filled 2024-03-31 – 2024-06-08 (×3): qty 180, 90d supply, fill #0

## 2024-04-03 ENCOUNTER — Other Ambulatory Visit (HOSPITAL_COMMUNITY): Payer: Self-pay

## 2024-04-13 ENCOUNTER — Other Ambulatory Visit (HOSPITAL_COMMUNITY): Payer: Self-pay

## 2024-04-25 ENCOUNTER — Other Ambulatory Visit (HOSPITAL_COMMUNITY): Payer: Self-pay

## 2024-04-26 ENCOUNTER — Other Ambulatory Visit (HOSPITAL_COMMUNITY): Payer: Self-pay

## 2024-04-26 ENCOUNTER — Other Ambulatory Visit: Payer: Self-pay | Admitting: "Endocrinology

## 2024-04-26 ENCOUNTER — Other Ambulatory Visit: Payer: Self-pay

## 2024-04-26 DIAGNOSIS — E039 Hypothyroidism, unspecified: Secondary | ICD-10-CM

## 2024-04-26 MED ORDER — LEVOTHYROXINE SODIUM 50 MCG PO TABS
50.0000 ug | ORAL_TABLET | Freq: Every day | ORAL | 0 refills | Status: DC
  Filled 2024-04-26 – 2024-07-10 (×3): qty 90, 90d supply, fill #0

## 2024-04-27 ENCOUNTER — Other Ambulatory Visit: Payer: Self-pay

## 2024-04-27 ENCOUNTER — Other Ambulatory Visit (HOSPITAL_COMMUNITY): Payer: Self-pay

## 2024-04-27 MED ORDER — ATORVASTATIN CALCIUM 40 MG PO TABS
40.0000 mg | ORAL_TABLET | Freq: Every day | ORAL | 3 refills | Status: AC
  Filled 2024-04-27: qty 90, 90d supply, fill #0
  Filled 2024-07-23: qty 90, 90d supply, fill #1
  Filled 2024-09-08 – 2024-10-18 (×3): qty 90, 90d supply, fill #2

## 2024-05-01 ENCOUNTER — Other Ambulatory Visit (HOSPITAL_COMMUNITY): Payer: Self-pay

## 2024-05-02 ENCOUNTER — Other Ambulatory Visit (HOSPITAL_COMMUNITY): Payer: Self-pay

## 2024-05-02 ENCOUNTER — Other Ambulatory Visit: Payer: Self-pay

## 2024-05-02 MED ORDER — FLUCONAZOLE 150 MG PO TABS
ORAL_TABLET | ORAL | 1 refills | Status: AC
  Filled 2024-05-02: qty 2, 7d supply, fill #0
  Filled 2024-08-08: qty 2, 7d supply, fill #1

## 2024-05-05 ENCOUNTER — Other Ambulatory Visit (HOSPITAL_COMMUNITY): Payer: Self-pay

## 2024-05-10 ENCOUNTER — Other Ambulatory Visit: Payer: Self-pay

## 2024-05-10 ENCOUNTER — Other Ambulatory Visit (HOSPITAL_COMMUNITY): Payer: Self-pay

## 2024-05-10 MED ORDER — PANTOPRAZOLE SODIUM 40 MG PO TBEC
40.0000 mg | DELAYED_RELEASE_TABLET | Freq: Two times a day (BID) | ORAL | 3 refills | Status: DC
  Filled 2024-05-10: qty 30, 15d supply, fill #0
  Filled 2024-06-08: qty 30, 15d supply, fill #1
  Filled 2024-08-08: qty 30, 15d supply, fill #2
  Filled 2024-08-16 – 2024-08-21 (×3): qty 30, 15d supply, fill #3

## 2024-06-05 DIAGNOSIS — G4733 Obstructive sleep apnea (adult) (pediatric): Secondary | ICD-10-CM | POA: Diagnosis not present

## 2024-06-08 ENCOUNTER — Other Ambulatory Visit: Payer: Self-pay

## 2024-06-08 ENCOUNTER — Other Ambulatory Visit (HOSPITAL_COMMUNITY): Payer: Self-pay

## 2024-06-09 ENCOUNTER — Other Ambulatory Visit (HOSPITAL_COMMUNITY): Payer: Self-pay

## 2024-06-09 MED ORDER — PROGESTERONE 200 MG PO CAPS
400.0000 mg | ORAL_CAPSULE | Freq: Every day | ORAL | 1 refills | Status: AC
  Filled 2024-06-09: qty 180, 90d supply, fill #0
  Filled 2024-08-14: qty 180, 90d supply, fill #1
  Filled 2024-08-16: qty 60, 30d supply, fill #1
  Filled 2024-09-20 – 2024-10-20 (×4): qty 180, 90d supply, fill #1

## 2024-06-12 ENCOUNTER — Other Ambulatory Visit: Payer: Self-pay

## 2024-06-14 ENCOUNTER — Other Ambulatory Visit (HOSPITAL_COMMUNITY): Payer: Self-pay

## 2024-06-14 MED ORDER — PHENTERMINE HCL 30 MG PO CAPS
30.0000 mg | ORAL_CAPSULE | Freq: Every morning | ORAL | 2 refills | Status: DC
  Filled 2024-06-14: qty 30, 30d supply, fill #0
  Filled 2024-08-08: qty 30, 30d supply, fill #1
  Filled 2024-08-16 – 2024-09-08 (×2): qty 30, 30d supply, fill #2

## 2024-06-26 ENCOUNTER — Other Ambulatory Visit (HOSPITAL_COMMUNITY): Payer: Self-pay

## 2024-06-27 ENCOUNTER — Other Ambulatory Visit (HOSPITAL_COMMUNITY): Payer: Self-pay

## 2024-06-27 ENCOUNTER — Encounter (HOSPITAL_COMMUNITY): Payer: Self-pay

## 2024-06-28 ENCOUNTER — Other Ambulatory Visit (HOSPITAL_COMMUNITY): Payer: Self-pay

## 2024-06-29 ENCOUNTER — Other Ambulatory Visit (HOSPITAL_COMMUNITY): Payer: Self-pay

## 2024-07-01 ENCOUNTER — Other Ambulatory Visit (HOSPITAL_COMMUNITY): Payer: Self-pay

## 2024-07-01 DIAGNOSIS — F5105 Insomnia due to other mental disorder: Secondary | ICD-10-CM | POA: Diagnosis not present

## 2024-07-01 DIAGNOSIS — F419 Anxiety disorder, unspecified: Secondary | ICD-10-CM | POA: Diagnosis not present

## 2024-07-01 DIAGNOSIS — F4312 Post-traumatic stress disorder, chronic: Secondary | ICD-10-CM | POA: Diagnosis not present

## 2024-07-01 DIAGNOSIS — F331 Major depressive disorder, recurrent, moderate: Secondary | ICD-10-CM | POA: Diagnosis not present

## 2024-07-01 MED ORDER — LAMOTRIGINE 200 MG PO TABS
200.0000 mg | ORAL_TABLET | Freq: Every day | ORAL | 0 refills | Status: DC
  Filled 2024-07-01: qty 90, 90d supply, fill #0

## 2024-07-01 MED ORDER — LAMOTRIGINE 100 MG PO TABS
100.0000 mg | ORAL_TABLET | Freq: Every day | ORAL | 0 refills | Status: DC
  Filled 2024-07-01: qty 90, 90d supply, fill #0

## 2024-07-01 MED ORDER — LORAZEPAM 0.5 MG PO TABS
0.5000 mg | ORAL_TABLET | Freq: Every day | ORAL | 2 refills | Status: AC | PRN
  Filled 2024-07-01: qty 10, 10d supply, fill #0
  Filled 2024-08-08: qty 10, 10d supply, fill #1
  Filled 2024-08-19 – 2024-08-21 (×2): qty 10, 10d supply, fill #2

## 2024-07-01 MED ORDER — TRAZODONE HCL 100 MG PO TABS
100.0000 mg | ORAL_TABLET | Freq: Every evening | ORAL | 0 refills | Status: DC | PRN
  Filled 2024-07-01: qty 180, 90d supply, fill #0

## 2024-07-01 MED ORDER — HYDROXYZINE PAMOATE 25 MG PO CAPS
25.0000 mg | ORAL_CAPSULE | Freq: Two times a day (BID) | ORAL | 0 refills | Status: DC | PRN
  Filled 2024-07-01: qty 180, 90d supply, fill #0

## 2024-07-04 ENCOUNTER — Encounter: Payer: Self-pay | Admitting: Emergency Medicine

## 2024-07-04 ENCOUNTER — Ambulatory Visit
Admission: EM | Admit: 2024-07-04 | Discharge: 2024-07-04 | Disposition: A | Discharge status: Home / Self Care | Attending: Family Medicine | Admitting: Family Medicine

## 2024-07-04 ENCOUNTER — Other Ambulatory Visit: Payer: Self-pay

## 2024-07-04 DIAGNOSIS — J069 Acute upper respiratory infection, unspecified: Secondary | ICD-10-CM | POA: Diagnosis not present

## 2024-07-04 DIAGNOSIS — R42 Dizziness and giddiness: Secondary | ICD-10-CM | POA: Diagnosis not present

## 2024-07-04 LAB — POC COVID19/FLU A&B COMBO
Covid Antigen, POC: NEGATIVE
Influenza A Antigen, POC: NEGATIVE
Influenza B Antigen, POC: NEGATIVE

## 2024-07-04 MED ORDER — PROMETHAZINE-DM 6.25-15 MG/5ML PO SYRP: 5.0000 mL | ORAL_SOLUTION | Freq: Four times a day (QID) | ORAL | 0 refills | Status: AC | PRN

## 2024-07-04 MED ORDER — ONDANSETRON 4 MG PO TBDP: 4.0000 mg | ORAL_TABLET | Freq: Three times a day (TID) | ORAL | 0 refills | Status: AC | PRN

## 2024-07-04 MED ORDER — MECLIZINE HCL 25 MG PO TABS: 25.0000 mg | ORAL_TABLET | Freq: Three times a day (TID) | ORAL | 0 refills | Status: AC | PRN

## 2024-07-04 MED ORDER — AZELASTINE HCL 0.1 % NA SOLN: 1.0000 | Freq: Two times a day (BID) | NASAL | 0 refills | Status: AC

## 2024-07-04 NOTE — ED Triage Notes (Signed)
 Pt reports cough, nausea, nasal congestion, bilateral ear pain, sore throat, body aches,chills since Saturday. Pt reports has tried zyrtec, xyzal , zofran  with minimal changes in symptoms.reports employer would like pt tested for covid/flu.

## 2024-07-04 NOTE — ED Provider Notes (Signed)
 [facility]    CSN: [id number] Arrival date & time: 07/04/24  9195      History   Chief Complaint No chief complaint on file.   HPI Heather Burnett is a 53 y.o. female.   Patient presenting today with several day history of room spinning dizziness, cough, congestion, nausea, bilateral ear pain, sore throat, body aches, chills.  Denies chest pain, shortness of breath, abdominal pain, vomiting, diarrhea.  So far trying Zyrtec, Xyzal , Zofran  with minimal relief.  Requesting COVID and flu testing.    Past Medical History:  Diagnosis Date   Achilles tendinitis of left lower extremity    Allergy    Anxiety    Asthma    WELL CONTROLLED   Back pain    Cancer (HCC)    melanoma on my chest   Chest pain    Constipation    COPD (chronic obstructive pulmonary disease) (HCC)    not sure about COPD    DDD (degenerative disc disease), cervical    and lower back   Depression    Diabetes mellitus, type II (HCC)    Diverticulitis    DJD (degenerative joint disease)    Dysrhythmia    Empty sella syndrome (HCC)    followed at [facility]   Empty sella syndrome [facility])    Family history of adverse reaction to anesthesia    mother almost died with anesthesia in the past, hard to wake up and got real sick on my stomach vomiting, now she receives local anethesia.   GERD (gastroesophageal reflux disease)    Hallux limitus of left foot    Headache    history of migraine   Heart murmur    Hiatal hernia    History of methicillin resistant staphylococcus aureus (MRSA)    History of palpitations 07/2004   Hyperlipidemia    Hypertension    Hypothyroidism    Hypothyroidism, adult 02/08/2020   Idiopathic peripheral neuropathy    Joint pain    Lactose intolerance    Lymphocytosis    Murmur    Neuropathy 2016   Osteoporosis    Palpitations    PCOS (polycystic ovarian syndrome)    Pituitary microadenoma (HCC)    followed at [facility]   Pneumonia 2008   Pre-diabetes    Sleep apnea     USES CPAP   Swallowing difficulty    Tachycardia    Thyroid  disease    Vitamin D  deficiency    Von Willebrand disease (HCC) 2012   PT STATES SHE HAS NEVER HAD ANY ISSUES WITH BLEEDING    Patient Active Problem List   Diagnosis Date Noted   Family history of heart disease 01/02/2024   Genetic testing 10/29/2023   Long term current use of oral hypoglycemic drug 09/22/2023   Insulin  long-term use (HCC) 05/06/2023   Polypharmacy 02/05/2023   Palpitations 09/25/2022   Non-cardiac chest pain 09/25/2022   Diabetes mellitus (HCC) 08/03/2022   Gastroesophageal reflux disease 04/07/2022   Class 2 severe obesity due to excess calories with serious comorbidity and body mass index (BMI) of 39.0 to 39.9 in adult [facility]) 04/07/2022   Diabetes mellitus without complication (HCC) 04/01/2022   Empty sella syndrome (HCC) 03/16/2022   Pituitary microadenoma (HCC) 03/16/2022   Sleep apnea 03/16/2022   Von Willebrand's disease (HCC) 03/16/2022   Bilious vomiting with nausea 03/10/2022   Family history of colon cancer 03/10/2022   GERD (gastroesophageal reflux disease) 03/10/2022   Low TSH level 02/11/2022   Iatrogenic  thyrotoxicosis 07/14/2021   Type 2 diabetes mellitus with obesity (HCC) 07/14/2021   Hypothyroidism 02/08/2020   Idiopathic peripheral neuropathy 03/09/2018   Hypertension    Hematuria, microscopic 02/12/2017   Dyspnea on exertion 09/26/2015   Tachycardia 09/26/2015   Arthritis, degenerative 07/31/2015   HLD (hyperlipidemia) 07/31/2015   Blood glucose elevated 12/06/2014   Benign essential HTN 03/15/2014   Class 3 severe obesity due to excess calories with serious comorbidity and body mass index (BMI) of 40.0 to 44.9 in adult 03/15/2014   Bilateral polycystic ovarian syndrome 03/15/2014    Past Surgical History:  Procedure Laterality Date   ACHILLES TENDON SURGERY Left 12/15/2019   Procedure: ACHILLES TENDON REPAIR PRIMARY;  Surgeon: Ashley Soulier, DPM;  Location: [facility] ORS;   Service: Podiatry;  Laterality: Left;   ACHILLES TENDON SURGERY Left 07/30/2023   Procedure: 72345 ACHILLES REPAIR SECONDARY;  Surgeon: Ashley Soulier, DPM;  Location: [facility] ORS;  Service: Orthopedics/Podiatry;  Laterality: Left;   ANKLE ARTHROSCOPY Left 07/30/2023   Procedure: ANKLE ARTHROSCOPY - OSTEOCHONDRITIS DISSECANS REPAIR;  Surgeon: Ashley Soulier, DPM;  Location: [facility] ORS;  Service: Orthopedics/Podiatry;  Laterality: Left;   ANTERIOR CERVICAL DECOMP/DISCECTOMY FUSION N/A 10/02/2020   Procedure: ANTERIOR CERVICAL DECOMPRESSION/DISCECTOMY FUSION 1 LEVEL C6-7;  Surgeon: Clois Fret, MD;  Location: [facility] ORS;  Service: Neurosurgery;  Laterality: N/A;   ARTHRODESIS METATARSALPHALANGEAL JOINT (MTPJ) Left 07/30/2023   Procedure: ARTHRODESIS METATARSALPHALANGEAL JOINT (MTPJ) FIRST;  Surgeon: Ashley Soulier, DPM;  Location: [facility] ORS;  Service: Orthopedics/Podiatry;  Laterality: Left;   BACK SURGERY     removal of 2 lumbar vertebrae   BREAST BIOPSY Right 2010   NEG   CHOLECYSTECTOMY     COLONOSCOPY  03/03/2013, 02/06/2008   COLONOSCOPY N/A 08/26/2022   Procedure: COLONOSCOPY;  Surgeon: Toledo, Ladell POUR, MD;  Location: [facility] ENDOSCOPY;  Service: Gastroenterology;  Laterality: N/A;   COLONOSCOPY WITH PROPOFOL  N/A 08/05/2018   Procedure: COLONOSCOPY WITH PROPOFOL ;  Surgeon: Viktoria Lamar DASEN, MD;  Location[facility];  Service: Endoscopy;  Laterality: N/A;   EGD  02/06/2008   ESOPHAGOGASTRODUODENOSCOPY N/A 06/10/2022   Procedure: ESOPHAGOGASTRODUODENOSCOPY (EGD);  Surgeon: Toledo, Ladell POUR, MD;  Location: [facility] ENDOSCOPY;  Service: Gastroenterology;  Laterality: N/A;   GASTROC RECESSION EXTREMITY Left 12/15/2019   Procedure: GASTROC RECESSION EXTREMITY;  Surgeon: Ashley Soulier, DPM;  Location: [facility] ORS;  Service: Podiatry;  Laterality: Left;   HALLUX VALGUS CHEILECTOMY Left 08/21/2016   Procedure: HALLUX VALGUS CHEILECTOMY;  Surgeon: Krystal Rosella, DPM;  Location: [facility] ORS;  Service: Podiatry;   Laterality: Left;   HEMORRHOID SURGERY  10/2014   SPINE SURGERY     TENDON REPAIR Left 07/30/2023   Procedure: 72340 FLEXOR TENDON REPAIR  SECOND;  Surgeon: Ashley Soulier, DPM;  Location: [facility] ORS;  Service: Orthopedics/Podiatry;  Laterality: Left;   TONSILLECTOMY AND ADENOIDECTOMY      OB History     Gravida  1   Para  1   Term      Preterm      AB      Living         SAB      IAB      Ectopic      Multiple      Live Births               Home Medications    Prior to Admission medications   Medication Sig Start Date End Date Taking? Authorizing Provider  azelastine  (ASTELIN ) 0.1 % nasal spray Place 1 spray into both  nostrils 2 (two) times daily. Use in each nostril as directed 07/04/24  Yes Stuart Vernell Norris, PA-C  meclizine  (ANTIVERT ) 25 MG tablet Take 1 tablet (25 mg total) by mouth 3 (three) times daily as needed for dizziness. 07/04/24  Yes Stuart Vernell Norris, PA-C  ondansetron  (ZOFRAN -ODT) 4 MG disintegrating tablet Take 1 tablet (4 mg total) by mouth every 8 (eight) hours as needed for nausea or vomiting. 07/04/24  Yes Stuart Vernell Norris, PA-C  promethazine -dextromethorphan (PROMETHAZINE -DM) 6.25-15 MG/5ML syrup Take 5 mLs by mouth 4 (four) times daily as needed. 07/04/24  Yes Stuart Vernell Norris, PA-C  acetaminophen  (TYLENOL ) 500 MG tablet Take 500 mg by mouth every 6 (six) hours as needed for mild pain or headache.     [provider]  albuterol  (VENTOLIN  HFA) 108 (90 Base) MCG/ACT inhaler Inhale 2 inhalations into the lungs every 6 (six) hours as needed for Wheezing 01/27/21     atorvastatin  (LIPITOR) 40 MG tablet Take 1 tablet (40 mg total) by mouth daily. 04/26/24     cetirizine (ZYRTEC) 10 MG tablet Take 10 mg by mouth daily as needed (seasonal allergies.).     [provider]  Cholecalciferol (VITAMIN D -3) 125 MCG (5000 UT) TABS Take 2 tablets by mouth daily.    [provider]  clonazePAM (KLONOPIN) 0.5 MG tablet  Take 0.5 mg by mouth daily as needed for anxiety. 07/30/20   [provider]  Continuous Blood Gluc Receiver (DEXCOM G7 RECEIVER) DEVI Use to test Blood glucose 4 times daily 11/26/22   Nida, Gebreselassie W, MD  Continuous Glucose Sensor (DEXCOM G7 SENSOR) MISC Change sensor every 10 days 01/05/24   Nida, Gebreselassie W, MD  cyclobenzaprine  (FLEXERIL ) 5 MG tablet Take 1-2 tablets (5-10 mg total) by mouth 3 (three) times daily as needed for muscle pain. 02/29/24     cycloSPORINE  (RESTASIS ) 0.05 % ophthalmic emulsion Place 1 drop into both eyes 2 (two) times daily. 08/10/23     empagliflozin  (JARDIANCE ) 25 MG TABS tablet Take 1 tablet (25 mg) by mouth daily before breakfast. 03/31/24   Nida, Gebreselassie W, MD  fluconazole  (DIFLUCAN ) 150 MG tablet Take 1 tablet (150 mg) by mouth now, may repeat dose in 1 week 05/02/24     fluticasone  (FLOVENT  HFA) 110 MCG/ACT inhaler Inhale 1 puff by mouth 2 (two) times daily 02/12/22     furosemide  (LASIX ) 40 MG tablet Take 1 tablet (40 mg total) by mouth dailyas needed for edema. 11/22/23     hydrocortisone  (ANUSOL -HC) 2.5 % rectal cream Apply 1 Application to the affected area 3 (three) times daily. 10/19/23     hydrocortisone  2.5 % cream Apply topically 2 (two) times daily 03/17/24     hydrOXYzine  (VISTARIL ) 25 MG capsule Take 1 capsule (25 mg total) by mouth 2 (two) times daily as needed. 07/01/23     hydrOXYzine  (VISTARIL ) 25 MG capsule Take 1 capsule (25 mg total) by mouth 2 (two) times daily as needed. 12/31/23     hydrOXYzine  (VISTARIL ) 25 MG capsule Take 1 capsule (25 mg total) by mouth 2 (two) times daily as needed. 07/01/24     Lactase (LACTAID PO) Take by mouth.    [provider]  lamoTRIgine  (LAMICTAL ) 100 MG tablet Take 1 tablet (100 mg total) by mouth at bedtime. 07/01/24     lamoTRIgine  (LAMICTAL ) 200 MG tablet Take 1 tablet (200 mg total) by mouth daily. 10/08/23     lamoTRIgine  (LAMICTAL ) 200 MG tablet Take 1 tablet (200 mg total)  by mouth  daily. 12/31/23     lamoTRIgine  (LAMICTAL ) 200 MG tablet Take 1 tablet (200 mg total) by mouth daily. 03/31/24     lamoTRIgine  (LAMICTAL ) 200 MG tablet Take 1 tablet (200 mg total) by mouth daily. 07/01/24     lamoTRIgine  (LAMICTAL ) 25 MG tablet Take 2 tablets (50 mg total) by mouth Nightly. 07/01/23     lamoTRIgine  (LAMICTAL ) 25 MG tablet Take 2 tablets (50 mg total) by mouth Nightly. 10/08/23     lamoTRIgine  (LAMICTAL ) 25 MG tablet Take 2 tablets (50 mg total) by mouth at bedtime. 03/31/24     levalbuterol  (XOPENEX  HFA) 45 MCG/ACT inhaler Inhale 2 puffs into the lungs every 6 (six) hours as needed for wheezing 11/13/21     levocetirizine (XYZAL ) 5 MG tablet Take 1 tablet (5 mg total) by mouth every evening. 12/31/23   Christopher Savannah, PA-C  levothyroxine  (SYNTHROID ) 50 MCG tablet Take 1 tablet (50 mcg total) by mouth daily before breakfast. 04/26/24   Nida, Gebreselassie W, MD  LORazepam  (ATIVAN ) 0.5 MG tablet Take 1 tablet (0.5 mg total) by mouth daily as needed. 04/02/23     LORazepam  (ATIVAN ) 0.5 MG tablet Take 1 tablet (0.5 mg total) by mouth daily as needed. 07/01/23     LORazepam  (ATIVAN ) 0.5 MG tablet Take 1 tablet (0.5 mg total) by mouth daily as needed. 10/08/23     LORazepam  (ATIVAN ) 0.5 MG tablet Take 1 tablet (0.5 mg total) by mouth daily as needed. 12/31/23     LORazepam  (ATIVAN ) 0.5 MG tablet Take 1 tablet (0.5 mg total) by mouth daily as needed. 03/31/24     LORazepam  (ATIVAN ) 0.5 MG tablet Take 1 tablet (0.5 mg total) by mouth daily as needed. 07/01/24     metFORMIN  (GLUCOPHAGE -XR) 500 MG 24 hr tablet Take 2 tablets (1,000 mg total) by mouth daily with breakfast. 03/31/24   Nida, Ethelle ORN, MD  metoprolol  tartrate (LOPRESSOR ) 25 MG tablet Take 1 tablet (25 mg total) by mouth daily as needed. 09/14/23 12/13/23  Mallipeddi, Diannah SQUIBB, MD  metroNIDAZOLE  (METROCREAM ) 0.75 % cream Apply cream topically to face twice daily 09/24/23     nitroGLYCERIN  (NITROSTAT ) 0.4 MG SL tablet Place 1 tablet under the  tongue every 5 minutes as needed for chest pain (up to 3 tablets total). If no relief after 3 doses, call 911. 09/25/22 09/14/23  Mallipeddi, Vishnu P, MD  ondansetron  (ZOFRAN -ODT) 4 MG disintegrating tablet Dissolve 1 tablet (4 mg total) by mouth every 8 (eight) hours as needed for Nausea for up to 7 days 08/02/23     pantoprazole  (PROTONIX ) 40 MG tablet Take 1 tablet (40 mg total) by mouth 2 (two) times daily before a meal. 05/10/24     phentermine  30 MG capsule Take 1 capsule (30 mg total) by mouth every morning before breakfast 06/14/24     progesterone  (PROMETRIUM ) 200 MG capsule Take 2 capsules (400 mg total) by mouth at bedtime. 06/09/24     spironolactone  (ALDACTONE ) 50 MG tablet Take 1 tablet (50 mg total) by mouth daily. 11/22/23   Mallipeddi, Vishnu P, MD  traZODone  (DESYREL ) 100 MG tablet Take 1-2 tablets (100-200 mg total) by mouth at bedtime as needed for sleep. 07/01/24     metoCLOPramide  (REGLAN ) 10 MG tablet Take 1 tablet (10 mg total) by mouth 3 (three) times daily as needed 11/04/22 02/19/23      Family History Family History  Problem Relation Age of Onset   Thyroid  disease Mother  Osteoporosis Mother    Diabetes Father    Hyperlipidemia Father    Hypertension Father    Skin cancer Father    Heart attack Father    Sudden Cardiac Death Father    Obesity Father    Lung cancer Maternal Uncle    Lung cancer Maternal Uncle    Hypertension Paternal Uncle    Heart disease Paternal Uncle    Brain cancer Paternal Uncle    Lung cancer Paternal Uncle    Ovarian cancer Maternal Grandmother        dx >50   Colon cancer Maternal Grandmother        dx >50   Bladder Cancer Maternal Grandmother        dx >50   Heart attack Paternal Grandfather    Breast cancer Other    Breast cancer Half-Sister 55   Colon cancer Half-Brother 41   Bladder Cancer Other    Ovarian cancer Other    Atrial fibrillation Paternal Aunt    Heart attack Paternal Aunt        40s-50s   Heart attack Paternal  Aunt        86s-50s    Social History Social History   Tobacco Use   Smoking status: Former    Current packs/day: 0.00    Types: Cigarettes    Start date: 04/09/1986    Quit date: 04/09/1994    Years since quitting: 30.2   Smokeless tobacco: Never  Vaping Use   Vaping status: Never Used  Substance Use Topics   Alcohol use: Yes    Comment: social   Drug use: No     Allergies   Liraglutide -weight management, Polysorbate, Sorbitan, Adhesive [tape], Latex, and Penicillins   Review of Systems Review of Systems Per HPI  Physical Exam Triage Vital Signs ED Triage Vitals  Encounter Vitals Group     BP 07/04/24 0812 118/77     Girls Systolic BP Percentile --      Girls Diastolic BP Percentile --      Boys Systolic BP Percentile --      Boys Diastolic BP Percentile --      Pulse Rate 07/04/24 0812 90     Resp 07/04/24 0812 20     Temp 07/04/24 0812 97.8 F (36.6 C)     Temp Source 07/04/24 0812 Oral     SpO2 07/04/24 0812 95 %     Weight --      Height --      Head Circumference --      Peak Flow --      Pain Score 07/04/24 0817 4     Pain Loc --      Pain Education --      Exclude from Growth Chart --    No data found.  Updated Vital Signs BP 118/77 (BP Location: Left Arm)   Pulse 90   Temp 97.8 F (36.6 C) (Oral)   Resp 20   LMP 06/30/2024 (Approximate)   SpO2 95%   Visual Acuity Right Eye Distance:   Left Eye Distance:   Bilateral Distance:    Right Eye Near:   Left Eye Near:    Bilateral Near:     Physical Exam Vitals and nursing note reviewed.  Constitutional:      Appearance: Normal appearance.  HENT:     Head: Atraumatic.     Right Ear: Tympanic membrane and external ear normal.     Left Ear: Tympanic membrane  and external ear normal.     Nose: Rhinorrhea present.     Mouth/Throat:     Mouth: Mucous membranes are moist.     Pharynx: Posterior oropharyngeal erythema present.  Eyes:     Extraocular Movements: Extraocular movements  intact.     Conjunctiva/sclera: Conjunctivae normal.  Cardiovascular:     Rate and Rhythm: Normal rate and regular rhythm.     Heart sounds: Normal heart sounds.  Pulmonary:     Effort: Pulmonary effort is normal.     Breath sounds: Normal breath sounds. No wheezing or rales.  Musculoskeletal:        General: Normal range of motion.     Cervical back: Normal range of motion and neck supple.  Skin:    General: Skin is warm and dry.  Neurological:     Mental Status: She is alert and oriented to person, place, and time.  Psychiatric:        Mood and Affect: Mood normal.        Thought Content: Thought content normal.      UC Treatments / Results  Labs (all labs ordered are listed, but only abnormal results are displayed) Labs Reviewed  POC COVID19/FLU A&B COMBO    EKG   Radiology No results found.  Procedures Procedures (including critical care time)  Medications Ordered in UC Medications - No data to display  Initial Impression / Assessment and Plan / UC Course  I have reviewed the triage vital signs and the nursing notes.  Pertinent labs & imaging results that were available during my care of the patient were reviewed by me and considered in my medical decision making (see chart for details).     Rapid flu and COVID-negative, vital signs and exam reassuring today.  Suspect viral respiratory infection possibly causing some vertigo.  Will treat with meclizine , Zofran , Astelin , Phenergan  DM, supportive over-the-counter medications and home care.  Work note given.  Return for worsening symptoms.  Final Clinical Impressions(s) / UC Diagnoses   Final diagnoses:  Viral URI with cough  Vertigo   Discharge Instructions   None    ED Prescriptions     Medication Sig Dispense Auth. Provider   meclizine  (ANTIVERT ) 25 MG tablet Take 1 tablet (25 mg total) by mouth 3 (three) times daily as needed for dizziness. 30 tablet Stuart Vernell Norris, PA-C   ondansetron   (ZOFRAN -ODT) 4 MG disintegrating tablet Take 1 tablet (4 mg total) by mouth every 8 (eight) hours as needed for nausea or vomiting. 20 tablet Stuart Vernell Norris, PA-C   azelastine  (ASTELIN ) 0.1 % nasal spray Place 1 spray into both nostrils 2 (two) times daily. Use in each nostril as directed 30 mL Stuart Vernell Norris, PA-C   promethazine -dextromethorphan (PROMETHAZINE -DM) 6.25-15 MG/5ML syrup Take 5 mLs by mouth 4 (four) times daily as needed. 100 mL Stuart Vernell Norris, [place]      PDMP not reviewed this encounter.   Stuart Vernell Norris, [place] 07/04/24 1943

## 2024-07-05 ENCOUNTER — Other Ambulatory Visit: Payer: Self-pay

## 2024-07-05 ENCOUNTER — Other Ambulatory Visit (HOSPITAL_COMMUNITY): Payer: Self-pay

## 2024-07-10 ENCOUNTER — Other Ambulatory Visit (HOSPITAL_COMMUNITY): Payer: Self-pay

## 2024-07-10 ENCOUNTER — Other Ambulatory Visit: Payer: Self-pay

## 2024-07-23 ENCOUNTER — Other Ambulatory Visit (HOSPITAL_COMMUNITY): Payer: Self-pay

## 2024-07-24 DIAGNOSIS — E119 Type 2 diabetes mellitus without complications: Secondary | ICD-10-CM | POA: Diagnosis not present

## 2024-07-25 LAB — COMPREHENSIVE METABOLIC PANEL WITH GFR
ALT: 18 IU/L (ref 0–32)
AST: 22 IU/L (ref 0–40)
Albumin: 4.6 g/dL (ref 3.8–4.9)
Alkaline Phosphatase: 87 IU/L (ref 49–135)
BUN/Creatinine Ratio: 22 (ref 9–23)
BUN: 20 mg/dL (ref 6–24)
Bilirubin Total: 0.5 mg/dL (ref 0.0–1.2)
CO2: 24 mmol/L (ref 20–29)
Calcium: 9.9 mg/dL (ref 8.7–10.2)
Chloride: 99 mmol/L (ref 96–106)
Creatinine, Ser: 0.92 mg/dL (ref 0.57–1.00)
Globulin, Total: 2.3 g/dL (ref 1.5–4.5)
Glucose: 106 mg/dL — ABNORMAL HIGH (ref 70–99)
Potassium: 4.3 mmol/L (ref 3.5–5.2)
Sodium: 139 mmol/L (ref 134–144)
Total Protein: 6.9 g/dL (ref 6.0–8.5)
eGFR: 75 mL/min/1.73 (ref >59)

## 2024-07-25 LAB — LIPID PANEL
Chol/HDL Ratio: 3 ratio (ref 0.0–4.4)
Cholesterol, Total: 127 mg/dL (ref 100–199)
HDL: 42 mg/dL (ref >39)
LDL Chol Calc (NIH): 50 mg/dL (ref 0–99)
Triglycerides: 218 mg/dL — ABNORMAL HIGH (ref 0–149)
VLDL Cholesterol Cal: 35 mg/dL (ref 5–40)

## 2024-07-25 LAB — TSH: TSH: 0.693 u[IU]/mL (ref 0.450–4.500)

## 2024-07-25 LAB — T4, FREE: Free T4: 1.28 ng/dL (ref 0.82–1.77)

## 2024-07-27 ENCOUNTER — Ambulatory Visit: Admitting: "Endocrinology

## 2024-07-27 ENCOUNTER — Encounter: Payer: Self-pay | Admitting: "Endocrinology

## 2024-07-27 VITALS — BP 110/72 | HR 96 | Ht 62.0 in | Wt 233.2 lb

## 2024-07-27 DIAGNOSIS — Z7984 Long term (current) use of oral hypoglycemic drugs: Secondary | ICD-10-CM

## 2024-07-27 DIAGNOSIS — E039 Hypothyroidism, unspecified: Secondary | ICD-10-CM | POA: Diagnosis not present

## 2024-07-27 DIAGNOSIS — E119 Type 2 diabetes mellitus without complications: Secondary | ICD-10-CM

## 2024-07-27 DIAGNOSIS — E782 Mixed hyperlipidemia: Secondary | ICD-10-CM

## 2024-07-27 LAB — POCT GLYCOSYLATED HEMOGLOBIN (HGB A1C)

## 2024-07-27 LAB — HEMOGLOBIN A1C: Hemoglobin A1C: 6.9

## 2024-07-27 NOTE — Progress Notes (Signed)
 07/27/2024, 4:00 PM   Endocrinology follow-up note  Subjective:    Patient ID: Heather Burnett, female    DOB: [birth year], PCP Fernande Ophelia JINNY DOUGLAS, MD   Past Medical History:  Diagnosis Date   Achilles tendinitis of left lower extremity    Allergy    Anxiety    Asthma    WELL CONTROLLED   Back pain    Cancer (HCC)    melanoma on my chest   Chest pain    Constipation    COPD (chronic obstructive pulmonary disease) (HCC)    not sure about COPD    DDD (degenerative disc disease), cervical    and lower back   Depression    Diabetes mellitus, type II (HCC)    Diverticulitis    DJD (degenerative joint disease)    Dysrhythmia    Empty sella syndrome    followed at [facility]   Empty sella syndrome    Family history of adverse reaction to anesthesia    mother almost died with anesthesia in the past, hard to wake up and got real sick on my stomach vomiting, now she receives local anethesia.   GERD (gastroesophageal reflux disease)    Hallux limitus of left foot    Headache    history of migraine   Heart murmur    Hiatal hernia    History of methicillin resistant staphylococcus aureus (MRSA)    History of palpitations 07/2004   Hyperlipidemia    Hypertension    Hypothyroidism    Hypothyroidism, adult 02/08/2020   Idiopathic peripheral neuropathy    Joint pain    Lactose intolerance    Lymphocytosis    Murmur    Neuropathy 2016   Osteoporosis    Palpitations    PCOS (polycystic ovarian syndrome)    Pituitary microadenoma (HCC)    followed at [facility]   Pneumonia 2008   Pre-diabetes    Sleep apnea    USES CPAP   Swallowing difficulty    Tachycardia    Thyroid  disease    Vitamin D  deficiency    Von Willebrand disease (HCC) 2012   PT STATES SHE HAS NEVER HAD ANY ISSUES WITH BLEEDING   Past Surgical History:  Procedure Laterality Date   ACHILLES TENDON SURGERY Left 12/15/2019   Procedure: ACHILLES TENDON REPAIR  PRIMARY;  Surgeon: Ashley Soulier, DPM;  Location: [facility];  Service: Podiatry;  Laterality: Left;   ACHILLES TENDON SURGERY Left 07/30/2023   Procedure: 72345 ACHILLES REPAIR SECONDARY;  Surgeon: Ashley Soulier, DPM;  Location: [facility];  Service: Orthopedics/Podiatry;  Laterality: Left;   ANKLE ARTHROSCOPY Left 07/30/2023   Procedure: ANKLE ARTHROSCOPY - OSTEOCHONDRITIS DISSECANS REPAIR;  Surgeon: Ashley Soulier, DPM;  Location: [facility];  Service: Orthopedics/Podiatry;  Laterality: Left;   ANTERIOR CERVICAL DECOMP/DISCECTOMY FUSION N/A 10/02/2020   Procedure: ANTERIOR CERVICAL DECOMPRESSION/DISCECTOMY FUSION 1 LEVEL C6-7;  Surgeon: Clois Fret, MD;  Location: [facility];  Service: Neurosurgery;  Laterality: N/A;   ARTHRODESIS METATARSALPHALANGEAL JOINT (MTPJ) Left 07/30/2023   Procedure: ARTHRODESIS METATARSALPHALANGEAL JOINT (MTPJ) FIRST;  Surgeon: Ashley Soulier, DPM;  Location: [facility];  Service: Orthopedics/Podiatry;  Laterality: Left;   BACK SURGERY     removal of 2 lumbar vertebrae  BREAST BIOPSY Right 2010   NEG   CHOLECYSTECTOMY     COLONOSCOPY  03/03/2013, 02/06/2008   COLONOSCOPY N/A 08/26/2022   Procedure: COLONOSCOPY;  Surgeon: Toledo, Ladell POUR, MD;  Location: [facility];  Service: Gastroenterology;  Laterality: N/A;   COLONOSCOPY WITH PROPOFOL  N/A 08/05/2018   Procedure: COLONOSCOPY WITH PROPOFOL ;  Surgeon: Viktoria Lamar DASEN, MD;  Location[facility]COPY;  Service: Endoscopy;  Laterality: N/A;   EGD  02/06/2008   ESOPHAGOGASTRODUODENOSCOPY N/A 06/10/2022   Procedure: ESOPHAGOGASTRODUODENOSCOPY (EGD);  Surgeon: Toledo, Ladell POUR, MD;  Location: [facility] ENDOSCOPY;  Service: Gastroenterology;  Laterality: N/A;   GASTROC RECESSION EXTREMITY Left 12/15/2019   Procedure: GASTROC RECESSION EXTREMITY;  Surgeon: Ashley Soulier, DPM;  Location: [facility];  Service: Podiatry;  Laterality: Left;   HALLUX VALGUS CHEILECTOMY Left 08/21/2016   Procedure: HALLUX VALGUS CHEILECTOMY;   Surgeon: Krystal Rosella, DPM;  Location: [facility];  Service: Podiatry;  Laterality: Left;   HEMORRHOID SURGERY  10/2014   SPINE SURGERY     TENDON REPAIR Left 07/30/2023   Procedure: 72340 FLEXOR TENDON REPAIR  SECOND;  Surgeon: Ashley Soulier, DPM;  Location: [facility];  Service: Orthopedics/Podiatry;  Laterality: Left;   TONSILLECTOMY AND ADENOIDECTOMY     Social History   Socioeconomic History   Marital status: Divorced    Spouse name: Not on file   Number of children: Not on file   Years of education: Not on file   Highest education level: Not on file  Occupational History   Occupation: CMA [employer]  Tobacco Use   Smoking status: Former    Current packs/day: 0.00    Types: Cigarettes    Start date: 04/09/1986    Quit date: 04/09/1994    Years since quitting: 30.3   Smokeless tobacco: Never  Vaping Use   Vaping status: Never Used  Substance and Sexual Activity   Alcohol use: Yes    Comment: social   Drug use: No   Sexual activity: Not on file  Other Topics Concern   Not on file  Social History Narrative   Divorced since 2009.Lives with 51 year old mum.CMA at [employer].   Social Drivers of Corporate investment banker Strain: Low Risk  (10/19/2023)   Received from [facility]   Overall Financial Resource Strain (CARDIA)    Difficulty of Paying Living Expenses: Not hard at all  Food Insecurity: No Food Insecurity (10/19/2023)   Received from [facility]   Hunger Vital Sign    Within the past 12 months, you worried that your food would run out before you got the money to buy more.: Never true    Within the past 12 months, the food you bought just didn't last and you didn't have money to get more.: Never true  Transportation Needs: No Transportation Needs (10/19/2023)   Received from [facility] - Transportation    In the past 12 months, has lack of transportation kept you from medical appointments or from  getting medications?: No    Lack of Transportation (Non-Medical): No  Physical Activity: Not on file  Stress: Not on file  Social Connections: Not on file   Family History  Problem Relation Age of Onset   Thyroid  disease Mother    Osteoporosis Mother    Diabetes Father    Hyperlipidemia Father    Hypertension Father    Skin cancer Father    Heart attack Father    Sudden Cardiac  Death Father    Obesity Father    Lung cancer Maternal Uncle    Lung cancer Maternal Uncle    Hypertension Paternal Uncle    Heart disease Paternal Uncle    Brain cancer Paternal Uncle    Lung cancer Paternal Uncle    Ovarian cancer Maternal Grandmother        dx >50   Colon cancer Maternal Grandmother        dx >50   Bladder Cancer Maternal Grandmother        dx >50   Heart attack Paternal Grandfather    Breast cancer Other    Breast cancer Half-Sister 96   Colon cancer Half-Brother 74   Bladder Cancer Other    Ovarian cancer Other    Atrial fibrillation Paternal Aunt    Heart attack Paternal Aunt        40s-50s   Heart attack Paternal Aunt        40s-50s   Outpatient Encounter Medications as of 07/27/2024  Medication Sig   acetaminophen  (TYLENOL ) 500 MG tablet Take 500 mg by mouth every 6 (six) hours as needed for mild pain or headache.    albuterol  (VENTOLIN  HFA) 108 (90 Base) MCG/ACT inhaler Inhale 2 inhalations into the lungs every 6 (six) hours as needed for Wheezing   atorvastatin  (LIPITOR) 40 MG tablet Take 1 tablet (40 mg total) by mouth daily.   azelastine  (ASTELIN ) 0.1 % nasal spray Place 1 spray into both nostrils 2 (two) times daily. Use in each nostril as directed   cetirizine (ZYRTEC) 10 MG tablet Take 10 mg by mouth daily as needed (seasonal allergies.).    Cholecalciferol (VITAMIN D -3) 125 MCG (5000 UT) TABS Take 2 tablets by mouth daily.   clonazePAM (KLONOPIN) 0.5 MG tablet Take 0.5 mg by mouth daily as needed for anxiety.   Continuous Blood Gluc Receiver (DEXCOM G7 RECEIVER)  DEVI Use to test Blood glucose 4 times daily   Continuous Glucose Sensor (DEXCOM G7 SENSOR) MISC Change sensor every 10 days   cyclobenzaprine  (FLEXERIL ) 5 MG tablet Take 1-2 tablets (5-10 mg total) by mouth 3 (three) times daily as needed for muscle pain.   cycloSPORINE  (RESTASIS ) 0.05 % ophthalmic emulsion Place 1 drop into both eyes 2 (two) times daily.   empagliflozin  (JARDIANCE ) 25 MG TABS tablet Take 1 tablet (25 mg) by mouth daily before breakfast.   fluconazole  (DIFLUCAN ) 150 MG tablet Take 1 tablet (150 mg) by mouth now, may repeat dose in 1 week   fluticasone  (FLOVENT  HFA) 110 MCG/ACT inhaler Inhale 1 puff by mouth 2 (two) times daily   furosemide  (LASIX ) 40 MG tablet Take 1 tablet (40 mg total) by mouth dailyas needed for edema.   hydrocortisone  (ANUSOL -HC) 2.5 % rectal cream Apply 1 Application to the affected area 3 (three) times daily.   hydrocortisone  2.5 % cream Apply topically 2 (two) times daily   hydrOXYzine  (VISTARIL ) 25 MG capsule Take 1 capsule (25 mg total) by mouth 2 (two) times daily as needed.   Lactase (LACTAID PO) Take by mouth.   lamoTRIgine  (LAMICTAL ) 100 MG tablet Take 1 tablet (100 mg total) by mouth at bedtime.   levalbuterol  (XOPENEX  HFA) 45 MCG/ACT inhaler Inhale 2 puffs into the lungs every 6 (six) hours as needed for wheezing   levocetirizine (XYZAL ) 5 MG tablet Take 1 tablet (5 mg total) by mouth every evening.   levothyroxine  (SYNTHROID ) 50 MCG tablet Take 1 tablet (50 mcg total) by mouth daily before breakfast.  LORazepam  (ATIVAN ) 0.5 MG tablet Take 1 tablet (0.5 mg total) by mouth daily as needed.   meclizine  (ANTIVERT ) 25 MG tablet Take 1 tablet (25 mg total) by mouth 3 (three) times daily as needed for dizziness.   metFORMIN  (GLUCOPHAGE -XR) 500 MG 24 hr tablet Take 2 tablets (1,000 mg total) by mouth daily with breakfast.   metoprolol  tartrate (LOPRESSOR ) 25 MG tablet Take 1 tablet (25 mg total) by mouth daily as needed.   metroNIDAZOLE  (METROCREAM )  0.75 % cream Apply cream topically to face twice daily   nitroGLYCERIN  (NITROSTAT ) 0.4 MG SL tablet Place 1 tablet under the tongue every 5 minutes as needed for chest pain (up to 3 tablets total). If no relief after 3 doses, call 911.   ondansetron  (ZOFRAN -ODT) 4 MG disintegrating tablet Dissolve 1 tablet (4 mg total) by mouth every 8 (eight) hours as needed for Nausea for up to 7 days   ondansetron  (ZOFRAN -ODT) 4 MG disintegrating tablet Take 1 tablet (4 mg total) by mouth every 8 (eight) hours as needed for nausea or vomiting.   pantoprazole  (PROTONIX ) 40 MG tablet Take 1 tablet (40 mg total) by mouth 2 (two) times daily before a meal.   phentermine  30 MG capsule Take 1 capsule (30 mg total) by mouth every morning before breakfast   progesterone  (PROMETRIUM ) 200 MG capsule Take 2 capsules (400 mg total) by mouth at bedtime.   promethazine -dextromethorphan (PROMETHAZINE -DM) 6.25-15 MG/5ML syrup Take 5 mLs by mouth 4 (four) times daily as needed.   spironolactone  (ALDACTONE ) 50 MG tablet Take 1 tablet (50 mg total) by mouth daily.   traZODone  (DESYREL ) 100 MG tablet Take 1-2 tablets (100-200 mg total) by mouth at bedtime as needed for sleep.   [DISCONTINUED] hydrOXYzine  (VISTARIL ) 25 MG capsule Take 1 capsule (25 mg total) by mouth 2 (two) times daily as needed.   [DISCONTINUED] hydrOXYzine  (VISTARIL ) 25 MG capsule Take 1 capsule (25 mg total) by mouth 2 (two) times daily as needed.   [DISCONTINUED] lamoTRIgine  (LAMICTAL ) 200 MG tablet Take 1 tablet (200 mg total) by mouth daily.   [DISCONTINUED] lamoTRIgine  (LAMICTAL ) 200 MG tablet Take 1 tablet (200 mg total) by mouth daily.   [DISCONTINUED] lamoTRIgine  (LAMICTAL ) 200 MG tablet Take 1 tablet (200 mg total) by mouth daily.   [DISCONTINUED] lamoTRIgine  (LAMICTAL ) 200 MG tablet Take 1 tablet (200 mg total) by mouth daily.   [DISCONTINUED] lamoTRIgine  (LAMICTAL ) 25 MG tablet Take 2 tablets (50 mg total) by mouth Nightly.   [DISCONTINUED] lamoTRIgine   (LAMICTAL ) 25 MG tablet Take 2 tablets (50 mg total) by mouth Nightly.   [DISCONTINUED] lamoTRIgine  (LAMICTAL ) 25 MG tablet Take 2 tablets (50 mg total) by mouth at bedtime.   [DISCONTINUED] LORazepam  (ATIVAN ) 0.5 MG tablet Take 1 tablet (0.5 mg total) by mouth daily as needed.   [DISCONTINUED] LORazepam  (ATIVAN ) 0.5 MG tablet Take 1 tablet (0.5 mg total) by mouth daily as needed.   [DISCONTINUED] LORazepam  (ATIVAN ) 0.5 MG tablet Take 1 tablet (0.5 mg total) by mouth daily as needed.   [DISCONTINUED] LORazepam  (ATIVAN ) 0.5 MG tablet Take 1 tablet (0.5 mg total) by mouth daily as needed.   [DISCONTINUED] LORazepam  (ATIVAN ) 0.5 MG tablet Take 1 tablet (0.5 mg total) by mouth daily as needed.   [DISCONTINUED] metoCLOPramide  (REGLAN ) 10 MG tablet Take 1 tablet (10 mg total) by mouth 3 (three) times daily as needed   No facility-administered encounter medications on file as of 07/27/2024.   ALLERGIES: Allergies  Allergen Reactions   Liraglutide -Weight Management  Other (See Comments)    GI upset   Polysorbate    Sorbitan Other (See Comments)    GI upset   Adhesive [Tape] Itching and Rash    Paper tape or coban is easier on the skin.   Latex Rash   Penicillins Rash    Updated on 09/26/2020 following APP conversation with patient.  Swelling of the face/tongue/throat, SOB, or low BP? NO Sudden or severe rash/hives, skin peeling, or any reaction on the inside of your mouth or nose? NO - low severity rash to body.  Seek medical attention at a hospital or doctor's office? Yes When did it last happen? Age 72 following a shot of PCN.       If all above answers are NO, may proceed with cephalosporin use. - Has taken cefdinir  and cephalexin without issues.      VACCINATION STATUS: Immunization History  Administered Date(s) Administered   Influenza Split 08/02/2015, 07/19/2018   Influenza,inj,Quad PF,6+ Mos 07/22/2017   Influenza-Unspecified 06/19/2020   Tdap 11/12/2010, 04/08/2021     HPI Heather Burnett is 53 y.o. female who presents today for follow-up after she was seen in consultation for hypothyroidism, type 2 diabetes.   See notes from previous visit.  She was diagnosed with hypothyroidism at approximate age of 84 years.   She was treated with various doses of thyroid  hormone over the years.  Due to problems stabilizing her thyroid  function tests she was switched from NP thyroid  to levothyroxine  several visits ago.   -She is currently on levothyroxine  50 mcg p.o. daily before breakfast.  Her previsit thyroid  function tests are consistent with appropriate replacement.    She reports good consistency.  She denies any exposure to thyroid  iodine  ablation nor thyroidectomy.   she denies dysphagia, shortness of breath nor voice change.  She has family history of various types of thyroid  dysfunction, however no thyroid  malignancy.  Her recent thyroid  ultrasound was unremarkable.  She also has type 2 diabetes currently on metformin  1000 mg p.o. daily and Jardiance  25 mg p.o. once a day.  She has lost coverage for her Dexcom in the interim, has not been monitoring blood glucose regularly.  Her point-of-care A1c is 6.9% today.   No change on her medications for diabetes, however she did have a brief exposure to steroids due to sinus infections.      Review of Systems  Constitutional:  + Fluctuating body weight, + fatigue, + subjective hyperthermia, no subjective hypothermia   Objective:       07/27/2024    3:36 PM 07/04/2024    8:12 AM 01/25/2024    3:54 PM  Vitals with BMI  Height 5' 2  5' 2  Weight 233 lbs 3 oz  236 lbs 13 oz  BMI 42.64  43.3  Systolic 110 118 891  Diastolic 72 77 74  Pulse 96 90 76    BP 110/72   Pulse 96   Ht 5' 2 (1.575 m)   Wt 233 lb 3.2 oz (105.8 kg)   LMP 06/30/2024 (Approximate)   BMI 42.65 kg/m   Wt Readings from Last 3 Encounters:  07/27/24 233 lb 3.2 oz (105.8 kg)  01/25/24 236 lb 12.8 oz (107.4 kg)  12/30/23 229 lb 12.8  oz (104.2 kg)    Physical Exam  Constitutional:  Body mass index is 42.65 kg/m.,  not in acute distress, normal state of mind Eyes: PERRLA, EOMI, no exophthalmos ENT: moist mucous membranes, no gross thyromegaly, no gross cervical  lymphadenopathy   CMP ( most recent) CMP     Component Value Date/Time   NA 139 07/24/2024 1640   K 4.3 07/24/2024 1640   CL 99 07/24/2024 1640   CO2 24 07/24/2024 1640   GLUCOSE 106 (H) 07/24/2024 1640   GLUCOSE 105 (H) 07/23/2023 1158   BUN 20 07/24/2024 1640   CREATININE 0.92 07/24/2024 1640   CREATININE 0.79 04/08/2021 1659   CALCIUM  9.9 07/24/2024 1640   PROT 6.9 07/24/2024 1640   ALBUMIN 4.6 07/24/2024 1640   AST 22 07/24/2024 1640   ALT 18 07/24/2024 1640   ALKPHOS 87 07/24/2024 1640   BILITOT 0.5 07/24/2024 1640   GFRNONAA >60 07/23/2023 1158   GFRNONAA 89 04/08/2021 1659   GFRAA 103 04/08/2021 1659     Diabetic Labs (most recent): Lab Results  Component Value Date   HGBA1C 6.6 01/25/2024   HGBA1C 7.2 (H) 04/29/2023   HGBA1C 8.0 (A) 02/04/2023   MICROALBUR <0.2 01/06/2021   MICROALBUR 20.3 (H) 07/11/2015     Lipid Panel ( most recent) Lipid Panel     Component Value Date/Time   CHOL 127 07/24/2024 1640   TRIG 218 (H) 07/24/2024 1640   HDL 42 07/24/2024 1640   CHOLHDL 3.0 07/24/2024 1640   CHOLHDL 4.4 04/29/2023 0730   VLDL 43 (H) 04/29/2023 0730   LDLCALC 50 07/24/2024 1640   LDLCALC 95 04/08/2021 1659   LABVLDL 35 07/24/2024 1640      Lab Results  Component Value Date   TSH 0.693 07/24/2024   TSH 1.010 01/21/2024   TSH 1.068 04/29/2023   TSH 0.796 11/03/2022   TSH 1.155 08/03/2022   TSH 0.073 (L) 05/01/2022   TSH 0.310 (L) 02/10/2022   TSH 0.741 10/22/2021   TSH <0.01 (L) 04/08/2021   TSH 1.137 07/21/2018   FREET4 1.28 07/24/2024   FREET4 1.10 01/21/2024   FREET4 0.87 04/29/2023   FREET4 0.74 11/03/2022   FREET4 0.62 08/03/2022   FREET4 0.86 05/01/2022   FREET4 1.37 02/10/2022   FREET4 0.83 10/22/2021       Thyroid  ultrasound October 24, 2021  There is a small subcentimeter anechoic cystic nodule in the right mid thyroid  lobe. This nodule does NOT meet TI-RADS criteria for biopsy or dedicated follow-up.   IMPRESSION: Normal size and sonographic appearance of the thyroid  gland.   Assessment & Plan:   1. Hypothyroidism 2.  Type 2 diabetes 3.  Hyperlipidemia 4.  Morbid obesity  Her previsit thyroid  function tests are consistent with appropriate replacement.  She is advised to continue levothyroxine  50 mcg p.o. daily before breakfast   - We discussed about the correct intake of her thyroid  hormone, on empty stomach at fasting, with water, separated by at least 30 minutes from breakfast and other medications,  and separated by more than 4 hours from calcium , iron, multivitamins, acid reflux medications (PPIs). -Patient is made aware of the fact that thyroid  hormone replacement is needed for life, dose to be adjusted by periodic monitoring of thyroid  function tests.  Her baseline thyroid  ultrasound is unremarkable. Regarding her metabolic dysfunction including type 2 diabetes hyperlipidemia, hypertension, morbid obesity, she remains a good candidate for lifestyle medicine.  - she acknowledges that there is a room for improvement in her food and drink choices. - Suggestion is made for her to avoid simple carbohydrates  from her diet including Cakes, Sweet Desserts, Ice Cream, Soda (diet and regular), Sweet Tea, Candies, Chips, Cookies, Store Bought Juices, Alcohol , Artificial Sweeteners,  Coffee Creamer, and Sugar-free Products, Lemonade. This will help patient to have more stable blood glucose profile and potentially avoid unintended weight gain.  The following Lifestyle Medicine recommendations according to American College of Lifestyle Medicine  [facility]) were discussed and and offered to patient and she  agrees to start the journey:  A. Whole Foods, Plant-Based Nutrition comprising of  fruits and vegetables, plant-based proteins, whole-grain carbohydrates was discussed in detail with the patient.   A list for source of those nutrients were also provided to the patient.  Patient will use only water or unsweetened tea for hydration. B.  The need to stay away from risky substances including alcohol, smoking; obtaining 7 to 9 hours of restorative sleep, at least 150 minutes of moderate intensity exercise weekly, the importance of healthy social connections,  and stress management techniques were discussed. C.  A full color page of  Calorie density of various food groups per pound showing examples of each food groups was provided to the patient.    She is responding to her current regimen for diabetes type 2.  She is advised to continue Jardiance  25 mg p.o. daily at breakfast and metformin  1000 mg p.o. twice a day with breakfast and supper.    -In light of her polypharmacy, even with partial engagement, she would benefit the most from lifestyle medicine.  See above. She is advised to continue atorvastatin  40 mg p.o. nightly for hyperlipidemia.    Side effects and precautions discussed with her.  She is advised to maintain close follow-up with her PMD.   I spent  26  minutes in the care of the patient today including review of labs from Thyroid  Function, CMP, and other relevant labs ; imaging/biopsy records (current and previous including abstractions from other facilities); face-to-face time discussing  her lab results and symptoms, medications doses, her options of short and long term treatment based on the latest standards of care / guidelines;   and documenting the encounter.  Harlene LITTIE Fridge  participated in the discussions, expressed understanding, and voiced agreement with the above plans.  All questions were answered to her satisfaction. she is encouraged to contact clinic should she have any questions or concerns prior to her return visit.  Follow up plan: Return in about  6 months (around 01/25/2025) for Fasting Labs  in AM B4 8, A1c -NV.   Ranny Earl, MD [facility] [address] [place], [place] [address] Phone: [phone]  Fax: [phone]     07/27/2024, 4:00 PM  This note was partially dictated with voice recognition software. Similar sounding words can be transcribed inadequately or may not  be corrected upon review.

## 2024-08-03 ENCOUNTER — Other Ambulatory Visit (HOSPITAL_COMMUNITY): Payer: Self-pay

## 2024-08-03 ENCOUNTER — Other Ambulatory Visit: Payer: Self-pay

## 2024-08-03 ENCOUNTER — Other Ambulatory Visit (HOSPITAL_BASED_OUTPATIENT_CLINIC_OR_DEPARTMENT_OTHER): Payer: Self-pay

## 2024-08-03 MED ORDER — FLUOXETINE HCL 20 MG PO CAPS
20.0000 mg | ORAL_CAPSULE | Freq: Every day | ORAL | 0 refills | Status: DC
  Filled 2024-08-03: qty 30, 30d supply, fill #0

## 2024-08-08 ENCOUNTER — Other Ambulatory Visit (HOSPITAL_COMMUNITY): Payer: Self-pay

## 2024-08-08 ENCOUNTER — Other Ambulatory Visit: Payer: Self-pay | Admitting: "Endocrinology

## 2024-08-09 ENCOUNTER — Other Ambulatory Visit (HOSPITAL_COMMUNITY): Payer: Self-pay

## 2024-08-09 ENCOUNTER — Other Ambulatory Visit: Payer: Self-pay

## 2024-08-09 MED ORDER — EMPAGLIFLOZIN 25 MG PO TABS
25.0000 mg | ORAL_TABLET | Freq: Every day | ORAL | 0 refills | Status: DC
  Filled 2024-08-09 – 2024-08-21 (×5): qty 90, 90d supply, fill #0
  Filled 2024-08-21: qty 30, 30d supply, fill #0
  Filled 2024-09-20 – 2024-09-21 (×2): qty 90, 90d supply, fill #0

## 2024-08-12 DIAGNOSIS — F419 Anxiety disorder, unspecified: Secondary | ICD-10-CM | POA: Diagnosis not present

## 2024-08-12 DIAGNOSIS — F5105 Insomnia due to other mental disorder: Secondary | ICD-10-CM | POA: Diagnosis not present

## 2024-08-12 DIAGNOSIS — F4312 Post-traumatic stress disorder, chronic: Secondary | ICD-10-CM | POA: Diagnosis not present

## 2024-08-15 ENCOUNTER — Other Ambulatory Visit (HOSPITAL_COMMUNITY): Payer: Self-pay

## 2024-08-16 ENCOUNTER — Other Ambulatory Visit: Payer: Self-pay | Admitting: "Endocrinology

## 2024-08-16 ENCOUNTER — Other Ambulatory Visit (HOSPITAL_COMMUNITY): Payer: Self-pay

## 2024-08-16 MED ORDER — METFORMIN HCL ER 500 MG PO TB24
1000.0000 mg | ORAL_TABLET | Freq: Every day | ORAL | 0 refills | Status: AC
  Filled 2024-08-16 – 2024-09-08 (×2): qty 180, 90d supply, fill #0

## 2024-08-17 ENCOUNTER — Other Ambulatory Visit (HOSPITAL_COMMUNITY): Payer: Self-pay

## 2024-08-18 ENCOUNTER — Other Ambulatory Visit: Payer: Self-pay

## 2024-08-18 ENCOUNTER — Other Ambulatory Visit (HOSPITAL_COMMUNITY): Payer: Self-pay

## 2024-08-19 ENCOUNTER — Other Ambulatory Visit (HOSPITAL_COMMUNITY): Payer: Self-pay

## 2024-08-21 ENCOUNTER — Other Ambulatory Visit (HOSPITAL_COMMUNITY): Payer: Self-pay

## 2024-08-21 ENCOUNTER — Encounter: Payer: Self-pay | Admitting: "Endocrinology

## 2024-08-21 ENCOUNTER — Other Ambulatory Visit (HOSPITAL_BASED_OUTPATIENT_CLINIC_OR_DEPARTMENT_OTHER): Payer: Self-pay

## 2024-08-21 ENCOUNTER — Other Ambulatory Visit: Payer: Self-pay

## 2024-08-21 ENCOUNTER — Telehealth: Payer: Self-pay | Admitting: "Endocrinology

## 2024-08-21 NOTE — Telephone Encounter (Signed)
 Pt has lost insurance, states that she can no longer afford jardiance .  Wants to know if there is any sort of patient assistance or something else that can be called in.

## 2024-08-21 NOTE — Telephone Encounter (Signed)
 Left a message requesting pt return call to the office.

## 2024-08-29 NOTE — Telephone Encounter (Signed)
 Pt came by and filled out a pt assistance form

## 2024-09-08 ENCOUNTER — Other Ambulatory Visit: Payer: Self-pay

## 2024-09-08 ENCOUNTER — Other Ambulatory Visit (HOSPITAL_COMMUNITY): Payer: Self-pay

## 2024-09-08 MED ORDER — PANTOPRAZOLE SODIUM 40 MG PO TBEC
40.0000 mg | DELAYED_RELEASE_TABLET | Freq: Two times a day (BID) | ORAL | 3 refills | Status: AC
  Filled 2024-09-08: qty 30, 15d supply, fill #0
  Filled 2024-09-20: qty 60, 30d supply, fill #1

## 2024-09-15 ENCOUNTER — Other Ambulatory Visit: Payer: Self-pay | Admitting: Internal Medicine

## 2024-09-20 ENCOUNTER — Other Ambulatory Visit: Payer: Self-pay | Admitting: "Endocrinology

## 2024-09-20 DIAGNOSIS — E039 Hypothyroidism, unspecified: Secondary | ICD-10-CM

## 2024-09-20 MED ORDER — METOPROLOL TARTRATE 25 MG PO TABS
25.0000 mg | ORAL_TABLET | Freq: Every day | ORAL | 0 refills | Status: DC | PRN
  Filled 2024-09-20: qty 30, 30d supply, fill #0

## 2024-09-21 ENCOUNTER — Other Ambulatory Visit (HOSPITAL_COMMUNITY): Payer: Self-pay

## 2024-09-21 ENCOUNTER — Other Ambulatory Visit: Payer: Self-pay

## 2024-09-21 MED ORDER — LEVOTHYROXINE SODIUM 50 MCG PO TABS
50.0000 ug | ORAL_TABLET | Freq: Every day | ORAL | 0 refills | Status: AC
  Filled 2024-09-21: qty 90, 90d supply, fill #0

## 2024-09-22 ENCOUNTER — Other Ambulatory Visit: Payer: Self-pay

## 2024-09-22 ENCOUNTER — Other Ambulatory Visit (HOSPITAL_COMMUNITY): Payer: Self-pay

## 2024-09-22 ENCOUNTER — Encounter (HOSPITAL_COMMUNITY): Payer: Self-pay

## 2024-09-26 ENCOUNTER — Other Ambulatory Visit: Payer: Self-pay | Admitting: "Endocrinology

## 2024-09-26 ENCOUNTER — Encounter: Payer: Self-pay | Admitting: "Endocrinology

## 2024-09-26 ENCOUNTER — Other Ambulatory Visit (HOSPITAL_COMMUNITY): Payer: Self-pay

## 2024-09-26 ENCOUNTER — Other Ambulatory Visit: Payer: Self-pay

## 2024-09-26 MED ORDER — DAPAGLIFLOZIN PROPANEDIOL 5 MG PO TABS
5.0000 mg | ORAL_TABLET | Freq: Every day | ORAL | 1 refills | Status: AC
  Filled 2024-09-26 – 2024-10-03 (×2): qty 90, 90d supply, fill #0

## 2024-09-27 NOTE — Telephone Encounter (Signed)
 Pt signed papers and papers were faxed to [facility] and Me

## 2024-09-28 NOTE — Progress Notes (Signed)
 History of Present Illness Heather Burnett is a 53 year old female who presents for an annual exam   She has experienced significant weight loss since October 16th, following a separation from her boyfriend of six years, which led to a period of depression and poor eating habits. She is now trying to maintain her weight loss by watching her diet and increasing her physical activity, including walking more at her new job. Her BMI remains above 35, and she has experienced weight loss.  Her blood pressure, previously high, has been stabilizing with the weight loss. She monitors her blood pressure closely, especially since her weight loss might necessitate adjustments in her medication. Her blood sugar levels have been good, with a recent reading of 100 mg/dL. A1c was 6.9 in October.  In early October, she experienced chest pain and trouble breathing, which she attributes to stress and panic attacks. These symptoms have improved significantly. She has not been following up with behavioral medicine but has been in contact with Dr. Kapoor, who has been supportive during this period.  No changes in hearing and she is scheduled for an eye exam next week due to some noticed changes. She is also maintaining regular dental appointments, with the next one scheduled for February.  Her bowel movements have been regular, aided by daily stool softeners and good hydration. She notes an improvement in her reflux symptoms with the weight loss.  She has a long menstrual cycle and is under the care of a gynecologist. She inquired about the high cost of her progesterone  medication, which is $130, and is seeking a more affordable option.   Current Outpatient Medications  Medication Sig Dispense Refill   albuterol  90 mcg/actuation inhaler Inhale 2 inhalations into the lungs every 6 (six) hours as needed for Wheezing 1 each 11   alpha lipoic acid 600 mg Cap capsule Take by mouth Take 2 caps daily     atorvastatin   (LIPITOR) 40 MG tablet Take 1 tablet (40 mg total) by mouth once daily 90 tablet 3   cholecalciferol (VITAMIN D3) 1000 unit tablet Take 1,000 Units by mouth once daily     citalopram  (CELEXA ) 20 MG tablet Take 20 mg by mouth once daily     cyclobenzaprine  (FLEXERIL ) 5 MG tablet Take 1-2 tablets (5-10 mg total) by mouth 3 (three) times daily as needed for muscle pain. 120 tablet 5   cycloSPORINE  (RESTASIS ) 0.05 % ophthalmic emulsion Apply 1 drop to eye 2 (two) times daily     empagliflozin  (JARDIANCE ) 10 mg tablet Take 25 mg by mouth once daily     FLOVENT  HFA 110 mcg/actuation inhaler Inhale 1 puff by mouth 2 (two) times daily 12 g 11   FLUoxetine  (PROZAC ) 10 MG capsule Take 10 mg by mouth once daily     FUROsemide  (LASIX ) 40 MG tablet Take 1 tablet (40 mg total) by mouth daily as needed for edema 30 tablet 5   hydrocortisone  2.5 % cream Apply topically 2 (two) times daily 30 g 0   hydrOXYzine  pamoate (VISTARIL ) 25 MG capsule Take 1 capsule by mouth 2 (two) times daily as needed     lamoTRIgine  (LAMICTAL ) 200 MG tablet Take 200 mg by mouth once daily     lamoTRIgine  (LAMICTAL ) 25 MG tablet Take 50 mg by mouth at bedtime     levocetirizine (XYZAL ) 5 MG tablet Take 1 tablet (5 mg total) by mouth every evening 30 tablet 11   LORazepam  (ATIVAN ) 0.5 MG tablet Take  0.5 mg by mouth once daily as needed     metFORMIN  (GLUCOPHAGE -XR) 500 MG XR tablet Take 1,000 mg by mouth daily with breakfast     metoprolol  tartrate (LOPRESSOR ) 50 MG tablet Take 1 tablet (50 mg total) by mouth 2 (two) times daily 60 tablet 11   metroNIDAZOLE  (METROCREAM ) 0.75 % cream Apply 1 Application topically     pantoprazole  (PROTONIX ) 40 MG DR tablet Take 1 tablet (40 mg total) by mouth 2 (two) times daily before a meal. 30 tablet 3   phentermine  (ADIPEX-P ) 30 MG capsule Take 1 capsule (30 mg total) by mouth every morning before breakfast 30 capsule 2   progesterone  (PROMETRIUM ) 200 MG capsule Take 2 capsules  (400 mg total) by mouth at bedtime. 180 capsule 1   spironolactone  (ALDACTONE ) 100 MG tablet Take 1 tablet (100 mg total) by mouth once daily 90 tablet 3   SYNTHROID  88 mcg tablet Take 88 mcg by mouth once daily     UNABLE TO FIND Med Name: BERBERINE 1000 mg  Takes 2 caps per day     No current facility-administered medications for this visit.    Allergies as of 09/29/2024 - Reviewed 09/29/2024  Allergen Reaction Noted   Latex Rash 02/05/2014   Penicillins Rash 02/05/2014   Polysorb hydrate [sorbitan sesquioleate] Unknown 02/05/2014   Polysorbate 20 Other (See Comments) 04/10/2015   Rybelsus  [semaglutide ] Nausea 02/19/2023   Saxenda [liraglutide] Other (See Comments) 09/20/2014   Adhesive tape-silicones Itching and Rash 08/17/2016    Past Medical History:  Diagnosis Date   Allergic state    Anxiety    Arrhythmia    Asthma without status asthmaticus (HHS-HCC)    Benign hypertension    Clotting disorder (HHS-HCC)    COPD (chronic obstructive pulmonary disease) (CMS/HHS-HCC)    Depression    Diabetes mellitus type 2, uncomplicated (CMS/HHS-HCC)    DJD (degenerative joint disease)    prior low back surgery, per report, details unavailable   Empty sella syndrome (HHS-HCC)    followed at [facility]   GERD (gastroesophageal reflux disease)    H/O hematuria    H/o microscopic hematuria/recurrent UTIs.  Consultation with Dr. Kassie 3/09.   Headache    Heart murmur    Hematuria, microscopic 02/12/2017   Evaluation and neurology consultation 2009   Hirsutism    History of palpitations 10/05   with chest pain, negative 24 hr. Holter and stress echo and echocardiogram with normal LV function, trace to mild MR.  Echo 10/11 with normal heart fxn; Holter 10/11 with no significant dysrhythmia.   History of skin cancer    Hx of acute bronchitis with bronchospasm    Hyperglycemia 12/06/2014   Hyperlipidemia    Hypersomnia with sleep apnea    Idiopathic  peripheral neuropathy 03/09/2018   Confirmed by nerve conduction velocities; testing stable 2019.   Lymphocytosis    Persistent, negative CLL profile 2009; flow cytometry normal 7/12.     Migraine headache    PCOS (polycystic ovarian syndrome)    Pituitary microadenoma (CMS/HHS-HCC)    followed at [facility]   Pneumonia 2008   Bronchospasm. Evaluated by Dr. Theotis, Pulmonology.   Prediabetes    Right leg pain    with hospitalization, October 2006. White blood cell count elevated at that time. Tagged white blood cell scan negative for osteomyelitis. MRI of LS spine revealing disc herniation L-4/L-5, leftward, though pain was right. MRI of pelvis unremarkable. Seen by Infectious Disease, Hematology, Orthopedics, Pain Clinic. ESR, CRP normal.  Sleep apnea    on CPAP.   Evaluated by Dr. Theotis, 12/11   Thyroid  enlargement    followed by Dr. Lum Gaskins   Ulnar nerve lesion    Vitamin D  deficiency 05/25/2014   Von Willebrand's disease (CMS/HHS-HCC)     Past Surgical History:  Procedure Laterality Date   CHOLECYSTECTOMY  1997   EGD  02/06/2008   No repeat per RTE   HEMORROIDECTOMY  04/11/2014   COLONOSCOPY  08/05/2018   [facility] (Brother) CBF 07/2023   C6-7 ANTERIOR CERVICAL DISCECTOMY AND FUSION  10/02/2020   Dr. Reeves Daisy at [facility], [place]   EGD @ [facility]  06/10/2022   Gastritis/Otherwise normal examined EGD/No Repeat/TKT   Colon @ [facility]  08/26/2022   Tubular adenomas/FHx CC/Repeat 39yrs/TKT   ACHILLES TENDON REPAIR  07/30/2023   Back Surgery to remove two lumbar vertebrae     Basal cell removed on neck     COLONOSCOPY  03/03/2013, 02/06/2008   [facility] (Brother): CBF 02/2018; Recall Ltr mailed 01/13/2018 (dh)   INCISIONAL BIOPSY BREAST     Melanoma     removed from chest   TONSILLECTOMY     Occult infection, s/p tonsillectomy, adenoidectomy, with resolution of symptoms, followed by Dr. Lum Gaskins.    Health Maintenance Topics with due status: Overdue      Topic Date Due   TSH Level 02/16/2024   Diabetes Eye Assessment Exam 08/04/2024   Annual Urine Albumin Creatinine Ratio 08/04/2024   Hemoglobin A1C 08/26/2024   Health Maintenance Topics with due status: Due On     Topic Date Due   Annual Physical/Well Child Check 09/10/2024   Health Maintenance Topics with due status: Due Soon     Topic Date Due   Mammogram 10/28/2024    Family History  Problem Relation Name Age of Onset   Diabetes Mother maggie    Asthma Mother maggie    Thyroid  disease Mother maggie    High blood pressure (Hypertension) Mother maggie    Hyperlipidemia (Elevated cholesterol) Mother maggie    Glaucoma Mother maggie    Diabetes Father jesse    Myocardial Infarction (Heart attack) Father jesse        73s   Aneurysm Father jesse    Coronary Artery Disease (Blocked arteries around heart) Father jesse    High blood pressure (Hypertension) Father jesse    Hyperlipidemia (Elevated cholesterol) Father jesse    Stroke Father jesse    Allergic rhinitis Father jesse    Diabetes type II Father jesse    Glaucoma Father jesse    Breast cancer Sister debra 59   Migraines Daughter     Myocardial Infarction (Heart attack) Paternal Grandfather     Anxiety Maternal Aunt lucy    Depression Maternal Aunt lucy    Diabetes type II Maternal Aunt lucy    Anxiety Maternal Uncle curtis    Depression Maternal Uncle curtis    Colon cancer Maternal Grandmother ada    Cervical cancer Maternal Grandmother ada    Ovarian cancer Maternal Grandmother ada    Colon cancer Brother william    Coronary Artery Disease (Blocked arteries around heart) Paternal Aunt alice    Diabetes type II Paternal Aunt alice    Coronary Artery Disease (Blocked arteries around heart) Paternal Uncle joe    Diabetes type II Paternal Uncle joe    Hyperlipidemia (Elevated cholesterol) Paternal Uncle joe    Skin cancer Paternal Uncle joe    Hip fracture Neg Hx  Social History   Socioeconomic History   Marital status: Single  Tobacco Use   Smoking status: Former    Types: Cigarettes   Smokeless tobacco: Never   Tobacco comments:    quit 23 years ago  Vaping Use   Vaping status: Never Used  Substance and Sexual Activity   Alcohol use: Not Currently    Alcohol/week: 1.0 standard drink of alcohol    Types: 1 Shots of liquor per week    Comment: social   Drug use: No   Sexual activity: Yes    Partners: Male    Birth control/protection: None   Social Drivers of Health   Financial Resource Strain: Medium Risk (09/28/2024)   Overall Financial Resource Strain (CARDIA)    Difficulty of Paying Living Expenses: Somewhat hard  Food Insecurity: No Food Insecurity (09/28/2024)   Hunger Vital Sign    Worried About Running Out of Food in the Last Year: Never true    Ran Out of Food in the Last Year: Never true  Transportation Needs: No Transportation Needs (09/28/2024)   PRAPARE - Administrator, Civil Service (Medical): No    Lack of Transportation (Non-Medical): No    Goals Addressed               This Visit's Progress    * Exercise  (pt-stated)   Not on track    * Lose Weight (pt-stated)   On track         BP 110/72   Pulse 86   Ht 156.2 cm (5' 1.5)   Wt 95.2 kg (209 lb 12.8 oz)   SpO2 97%   BMI 39.00 kg/m   GENERAL:  Well developed well nourished patient, in no apparent distress   HEENT:  Pupils equal and round. Extraocular motion intact.  Oropharynx is moist without lesions.  TMs clear NECK: No thyromegaly or lymphadenopathy.   LUNGS: Clear bilaterally without wheezing or retractions.   CARDIAC:  Regular rate and rhythm. 1/6 systolic murmur; no rubs or gallops.  VASCULAR: carotid and radial pulses 2+; distal pulses 2+ ABDOMEN: soft, nontender, nondistended, positive bowel sounds. No guard or rebound BREAST/GENITALIA/RECTAL: Deferred to gynecology EXTREMITIES: No clubbing or cyanosis.   Surgical scars over the left Achilles, left lateral malleolus, left great toe.  Bruising on the right foot from dropping a can on her second toe, with no other foot lesions. NEUROLOGIC:  Cranial nerves grossly intact.  Decreased light touch in the feet bilaterally.     Routine general medical examination at a health care facility  (primary encounter diagnosis) Essential hypertension, benign Type 2 diabetes with complication (CMS/HHS-HCC) Pituitary microadenoma (CMS/HHS-HCC) BMI 40.0-44.9, adult (CMS-HCC) Von Willebrand's disease (CMS/HHS-HCC) Vitamin D  deficiency Gastroesophageal reflux disease, unspecified whether esophagitis present Idiopathic peripheral neuropathy PCOS (polycystic ovarian syndrome) Empty sella syndrome (HHS-HCC) Hematuria, microscopic Dyslipidemia Depression screening  Assessment & Plan Essential hypertension Blood pressure controlled; monitor due to weight loss risk of hypotension. - Monitor blood pressure regularly for hypotension. - Limit sodium intake. - Ordered metabolic panel, urinalysis, and microalbumin.  Type 2 diabetes mellitus with peripheral neuropathy Blood sugar controlled; A1c 6.9% in October. Weight loss may improve control. - Continue current diabetes management regimen. - Monitor blood sugar levels regularly. - Ordered A1c if not done elsewhere.  Follow metabolic panel, urinalysis and A1c  Dyslipidemia Ongoing treatment; monitor liver function, lipids, thyroid  function. - Continue current treatment for dyslipidemia. - Ordered metabolic panel, urinalysis, and microalbumin.  Follow liver, lipids, thyroid  function.  Lifestyle efforts  Obstructive sleep apnea Regular CPAP use beneficial; weight loss may improve symptoms. - Continue regular use of CPAP.  Compliance reviewed  Pituitary microadenoma No new symptoms; regular endocrinology follow-up ongoing. - Continue regular follow-up with endocrinology.  No new symptoms  Polycystic ovarian  syndrome Current management effective; discussed progesterone  cost and alternatives. - Explored cost-effective options for progesterone  with pharmacist and GoodRx.  Hypothyroidism Thyroid  function well managed; regular monitoring necessary. - Continue current management for hypothyroidism. - Ordered TSH if not done elsewhere.  Morbid obesity, BMI 40.0-44.9 Significant weight loss achieved; maintain efforts. - Continue efforts to maintain weight loss.  Follow weight at home  Chronic idiopathic constipation Current bowel regimen effective; hydration encouraged. - Continue current bowel regimen. - Encouraged adequate hydration.  Gastroesophageal reflux disease Symptoms improved with weight loss. - Continue current management for GERD.  Over-the-counter medicines if needed  Vitamin D  deficiency Current supplementation ongoing. - Continue current vitamin D  supplementation.  Dysmenorrhea Current regimen effective. - Continue current regimen for dysmenorrhea.  General Health Maintenance - Ensure mammogram is completed in January. - Complete scheduled eye exam. - Attend dental appointment in February.  Follow-up in 6 months, returning sooner if needed    BERT JACK KLEIN III, MD  Portions of this note were created using dictation software and may contain typographical errors.   Use of artificial intelligence tool (ABRIDGE) to help with documentation was discussed with patient. *Some images could not be shown.

## 2024-09-29 ENCOUNTER — Other Ambulatory Visit: Payer: Self-pay

## 2024-09-29 ENCOUNTER — Other Ambulatory Visit (HOSPITAL_COMMUNITY): Payer: Self-pay

## 2024-10-03 ENCOUNTER — Other Ambulatory Visit: Payer: Self-pay

## 2024-10-03 ENCOUNTER — Other Ambulatory Visit (HOSPITAL_COMMUNITY): Payer: Self-pay

## 2024-10-03 ENCOUNTER — Encounter (HOSPITAL_COMMUNITY): Payer: Self-pay

## 2024-10-03 MED ORDER — LORAZEPAM 0.5 MG PO TABS
0.5000 mg | ORAL_TABLET | Freq: Every day | ORAL | 2 refills | Status: AC | PRN
  Filled 2024-10-03: qty 10, 10d supply, fill #0

## 2024-10-03 MED ORDER — HYDROXYZINE PAMOATE 25 MG PO CAPS
25.0000 mg | ORAL_CAPSULE | Freq: Two times a day (BID) | ORAL | 0 refills | Status: AC | PRN
  Filled 2024-10-03: qty 180, 90d supply, fill #0

## 2024-10-03 MED ORDER — FLUOXETINE HCL 20 MG PO CAPS
20.0000 mg | ORAL_CAPSULE | Freq: Every day | ORAL | 2 refills | Status: AC
  Filled 2024-10-03: qty 30, 30d supply, fill #0
  Filled 2024-11-13: qty 30, 30d supply, fill #1

## 2024-10-03 MED ORDER — LAMOTRIGINE 100 MG PO TABS
100.0000 mg | ORAL_TABLET | Freq: Every day | ORAL | 0 refills | Status: AC
  Filled 2024-10-03: qty 90, 90d supply, fill #0

## 2024-10-03 MED ORDER — LAMOTRIGINE 200 MG PO TABS
200.0000 mg | ORAL_TABLET | Freq: Every day | ORAL | 0 refills | Status: AC
  Filled 2024-10-03: qty 90, 90d supply, fill #0

## 2024-10-03 MED ORDER — TRAZODONE HCL 100 MG PO TABS
100.0000 mg | ORAL_TABLET | Freq: Every evening | ORAL | 0 refills | Status: AC | PRN
  Filled 2024-10-03: qty 180, 90d supply, fill #0

## 2024-10-04 ENCOUNTER — Other Ambulatory Visit (HOSPITAL_COMMUNITY): Payer: Self-pay

## 2024-10-04 MED FILL — Tirzepatide Soln Auto-injector 2.5 MG/0.5ML: 2.5000 mg | SUBCUTANEOUS | 28 days supply | Qty: 2 | Fill #0 | Status: CN

## 2024-10-05 ENCOUNTER — Other Ambulatory Visit (HOSPITAL_COMMUNITY): Payer: Self-pay

## 2024-10-05 ENCOUNTER — Other Ambulatory Visit: Payer: Self-pay

## 2024-10-16 ENCOUNTER — Other Ambulatory Visit (HOSPITAL_COMMUNITY): Payer: Self-pay

## 2024-10-18 ENCOUNTER — Other Ambulatory Visit (HOSPITAL_COMMUNITY): Payer: Self-pay

## 2024-10-18 ENCOUNTER — Other Ambulatory Visit: Payer: Self-pay | Admitting: Internal Medicine

## 2024-10-18 ENCOUNTER — Other Ambulatory Visit (HOSPITAL_BASED_OUTPATIENT_CLINIC_OR_DEPARTMENT_OTHER): Payer: Self-pay

## 2024-10-20 ENCOUNTER — Other Ambulatory Visit (HOSPITAL_COMMUNITY): Payer: Self-pay

## 2024-10-20 ENCOUNTER — Other Ambulatory Visit: Payer: Self-pay

## 2024-10-20 MED ORDER — METOPROLOL TARTRATE 25 MG PO TABS
25.0000 mg | ORAL_TABLET | Freq: Every day | ORAL | 1 refills | Status: AC | PRN
  Filled 2024-10-20: qty 90, 90d supply, fill #0

## 2024-11-13 ENCOUNTER — Other Ambulatory Visit (HOSPITAL_COMMUNITY): Payer: Self-pay

## 2024-11-14 ENCOUNTER — Other Ambulatory Visit (HOSPITAL_COMMUNITY): Payer: Self-pay

## 2024-11-14 MED ORDER — PHENTERMINE HCL 30 MG PO CAPS
30.0000 mg | ORAL_CAPSULE | Freq: Every morning | ORAL | 2 refills | Status: AC
  Filled 2024-11-14: qty 30, 30d supply, fill #0

## 2024-11-15 ENCOUNTER — Other Ambulatory Visit: Payer: Self-pay

## 2025-01-25 ENCOUNTER — Ambulatory Visit: Payer: PRIVATE HEALTH INSURANCE | Admitting: "Endocrinology
